# Patient Record
Sex: Male | Born: 1945
Health system: Southern US, Community
[De-identification: ages and names within clinical notes are randomized; demographics above are authoritative.]

## PROBLEM LIST (undated history)

## (undated) DIAGNOSIS — I739 Peripheral vascular disease, unspecified: Secondary | ICD-10-CM

## (undated) DIAGNOSIS — R011 Cardiac murmur, unspecified: Secondary | ICD-10-CM

## (undated) DIAGNOSIS — I10 Essential (primary) hypertension: Secondary | ICD-10-CM

## (undated) DIAGNOSIS — I700 Atherosclerosis of aorta: Secondary | ICD-10-CM

## (undated) DIAGNOSIS — D539 Nutritional anemia, unspecified: Secondary | ICD-10-CM

## (undated) DIAGNOSIS — R002 Palpitations: Secondary | ICD-10-CM

## (undated) DIAGNOSIS — E785 Hyperlipidemia, unspecified: Secondary | ICD-10-CM

## (undated) DIAGNOSIS — R9439 Abnormal result of other cardiovascular function study: Secondary | ICD-10-CM

## (undated) DIAGNOSIS — M1990 Unspecified osteoarthritis, unspecified site: Secondary | ICD-10-CM

## (undated) DIAGNOSIS — I779 Disorder of arteries and arterioles, unspecified: Secondary | ICD-10-CM

## (undated) DIAGNOSIS — I7300 Raynaud's syndrome without gangrene: Secondary | ICD-10-CM

## (undated) HISTORY — DX: Unspecified osteoarthritis, unspecified site: M19.90

## (undated) HISTORY — DX: Disorder of arteries and arterioles, unspecified: I77.9

## (undated) HISTORY — PX: BILATERAL KNEE ARTHROSCOPY: SUR91

## (undated) HISTORY — DX: Hyperlipidemia, unspecified: E78.5

## (undated) HISTORY — PX: APPENDECTOMY: SHX54

## (undated) HISTORY — PX: RETINAL DETACHMENT SURGERY: SHX105

## (undated) HISTORY — DX: Peripheral vascular disease, unspecified: I73.9

## (undated) HISTORY — DX: Atherosclerosis of aorta: I70.0

## (undated) HISTORY — PX: SHOULDER SURGERY: SHX246

## (undated) HISTORY — DX: Cardiac murmur, unspecified: R01.1

## (undated) HISTORY — DX: Raynaud's syndrome without gangrene: I73.00

## (undated) HISTORY — DX: Palpitations: R00.2

## (undated) HISTORY — PX: NECK SURGERY: SHX720

## (undated) HISTORY — PX: TENDON REPAIR: SHX5111

## (undated) HISTORY — PX: HERNIA REPAIR: SHX51

## (undated) HISTORY — DX: Abnormal result of other cardiovascular function study: R94.39

## (undated) HISTORY — PX: BACK SURGERY: SHX140

## (undated) HISTORY — PX: NOSE SURGERY: SHX723

---

## 2001-03-16 ENCOUNTER — Observation Stay (HOSPITAL_COMMUNITY): Admission: RE | Admit: 2001-03-16 | Discharge: 2001-03-17 | Payer: Self-pay | Admitting: Neurological Surgery

## 2001-03-16 ENCOUNTER — Encounter: Payer: Self-pay | Admitting: Neurological Surgery

## 2001-04-06 ENCOUNTER — Encounter: Payer: Self-pay | Admitting: Neurological Surgery

## 2001-04-06 ENCOUNTER — Encounter: Admission: RE | Admit: 2001-04-06 | Discharge: 2001-04-06 | Payer: Self-pay | Admitting: Neurological Surgery

## 2007-12-01 ENCOUNTER — Ambulatory Visit: Payer: Self-pay | Admitting: General Practice

## 2009-02-21 ENCOUNTER — Ambulatory Visit: Payer: Self-pay | Admitting: General Practice

## 2009-03-28 ENCOUNTER — Ambulatory Visit: Payer: Self-pay | Admitting: General Practice

## 2009-04-17 ENCOUNTER — Ambulatory Visit: Payer: Self-pay | Admitting: General Practice

## 2010-04-18 ENCOUNTER — Ambulatory Visit: Payer: Self-pay | Admitting: General Practice

## 2011-02-25 ENCOUNTER — Ambulatory Visit: Payer: Self-pay | Admitting: General Practice

## 2011-11-05 ENCOUNTER — Ambulatory Visit: Payer: Self-pay | Admitting: General Practice

## 2012-02-10 ENCOUNTER — Ambulatory Visit: Payer: Self-pay | Admitting: General Practice

## 2012-07-16 DIAGNOSIS — Z23 Encounter for immunization: Secondary | ICD-10-CM | POA: Diagnosis not present

## 2012-08-03 DIAGNOSIS — I10 Essential (primary) hypertension: Secondary | ICD-10-CM | POA: Diagnosis not present

## 2012-08-03 DIAGNOSIS — IMO0002 Reserved for concepts with insufficient information to code with codable children: Secondary | ICD-10-CM | POA: Diagnosis not present

## 2012-08-03 DIAGNOSIS — I499 Cardiac arrhythmia, unspecified: Secondary | ICD-10-CM | POA: Diagnosis not present

## 2012-08-03 DIAGNOSIS — M129 Arthropathy, unspecified: Secondary | ICD-10-CM | POA: Diagnosis not present

## 2012-08-12 DIAGNOSIS — N486 Induration penis plastica: Secondary | ICD-10-CM | POA: Diagnosis not present

## 2012-08-12 DIAGNOSIS — E291 Testicular hypofunction: Secondary | ICD-10-CM | POA: Diagnosis not present

## 2012-08-26 DIAGNOSIS — M129 Arthropathy, unspecified: Secondary | ICD-10-CM | POA: Diagnosis not present

## 2012-08-26 DIAGNOSIS — F17200 Nicotine dependence, unspecified, uncomplicated: Secondary | ICD-10-CM | POA: Diagnosis not present

## 2012-08-26 DIAGNOSIS — N400 Enlarged prostate without lower urinary tract symptoms: Secondary | ICD-10-CM | POA: Diagnosis not present

## 2012-08-26 DIAGNOSIS — I10 Essential (primary) hypertension: Secondary | ICD-10-CM | POA: Diagnosis not present

## 2012-10-28 DIAGNOSIS — N486 Induration penis plastica: Secondary | ICD-10-CM | POA: Diagnosis not present

## 2012-11-15 DIAGNOSIS — I10 Essential (primary) hypertension: Secondary | ICD-10-CM | POA: Diagnosis not present

## 2012-11-15 DIAGNOSIS — K2100 Gastro-esophageal reflux disease with esophagitis, without bleeding: Secondary | ICD-10-CM | POA: Diagnosis not present

## 2012-11-15 DIAGNOSIS — N400 Enlarged prostate without lower urinary tract symptoms: Secondary | ICD-10-CM | POA: Diagnosis not present

## 2012-11-15 DIAGNOSIS — M549 Dorsalgia, unspecified: Secondary | ICD-10-CM | POA: Diagnosis not present

## 2012-11-15 DIAGNOSIS — Z23 Encounter for immunization: Secondary | ICD-10-CM | POA: Diagnosis not present

## 2012-11-26 DIAGNOSIS — Z23 Encounter for immunization: Secondary | ICD-10-CM | POA: Diagnosis not present

## 2012-11-26 DIAGNOSIS — N400 Enlarged prostate without lower urinary tract symptoms: Secondary | ICD-10-CM | POA: Diagnosis not present

## 2012-11-26 DIAGNOSIS — J019 Acute sinusitis, unspecified: Secondary | ICD-10-CM | POA: Diagnosis not present

## 2012-11-26 DIAGNOSIS — K2100 Gastro-esophageal reflux disease with esophagitis, without bleeding: Secondary | ICD-10-CM | POA: Diagnosis not present

## 2012-12-16 DIAGNOSIS — H33309 Unspecified retinal break, unspecified eye: Secondary | ICD-10-CM | POA: Diagnosis not present

## 2012-12-16 DIAGNOSIS — H5200 Hypermetropia, unspecified eye: Secondary | ICD-10-CM | POA: Diagnosis not present

## 2012-12-17 DIAGNOSIS — R5381 Other malaise: Secondary | ICD-10-CM | POA: Diagnosis not present

## 2012-12-17 DIAGNOSIS — K2100 Gastro-esophageal reflux disease with esophagitis, without bleeding: Secondary | ICD-10-CM | POA: Diagnosis not present

## 2012-12-17 DIAGNOSIS — M5380 Other specified dorsopathies, site unspecified: Secondary | ICD-10-CM | POA: Diagnosis not present

## 2012-12-17 DIAGNOSIS — R109 Unspecified abdominal pain: Secondary | ICD-10-CM | POA: Diagnosis not present

## 2012-12-23 DIAGNOSIS — R198 Other specified symptoms and signs involving the digestive system and abdomen: Secondary | ICD-10-CM | POA: Diagnosis not present

## 2012-12-23 DIAGNOSIS — R109 Unspecified abdominal pain: Secondary | ICD-10-CM | POA: Diagnosis not present

## 2012-12-23 DIAGNOSIS — R110 Nausea: Secondary | ICD-10-CM | POA: Diagnosis not present

## 2012-12-24 DIAGNOSIS — K5190 Ulcerative colitis, unspecified, without complications: Secondary | ICD-10-CM | POA: Diagnosis not present

## 2012-12-24 DIAGNOSIS — K3189 Other diseases of stomach and duodenum: Secondary | ICD-10-CM | POA: Diagnosis not present

## 2012-12-24 DIAGNOSIS — R1013 Epigastric pain: Secondary | ICD-10-CM | POA: Diagnosis not present

## 2012-12-24 DIAGNOSIS — K319 Disease of stomach and duodenum, unspecified: Secondary | ICD-10-CM | POA: Diagnosis not present

## 2012-12-24 DIAGNOSIS — K2289 Other specified disease of esophagus: Secondary | ICD-10-CM | POA: Diagnosis not present

## 2012-12-24 DIAGNOSIS — R109 Unspecified abdominal pain: Secondary | ICD-10-CM | POA: Diagnosis not present

## 2012-12-24 DIAGNOSIS — K6389 Other specified diseases of intestine: Secondary | ICD-10-CM | POA: Diagnosis not present

## 2012-12-24 DIAGNOSIS — R198 Other specified symptoms and signs involving the digestive system and abdomen: Secondary | ICD-10-CM | POA: Diagnosis not present

## 2012-12-24 DIAGNOSIS — K633 Ulcer of intestine: Secondary | ICD-10-CM | POA: Diagnosis not present

## 2012-12-24 DIAGNOSIS — K2940 Chronic atrophic gastritis without bleeding: Secondary | ICD-10-CM | POA: Diagnosis not present

## 2012-12-24 DIAGNOSIS — K5289 Other specified noninfective gastroenteritis and colitis: Secondary | ICD-10-CM | POA: Diagnosis not present

## 2012-12-24 DIAGNOSIS — K209 Esophagitis, unspecified without bleeding: Secondary | ICD-10-CM | POA: Diagnosis not present

## 2012-12-24 LAB — HM COLONOSCOPY

## 2012-12-29 ENCOUNTER — Ambulatory Visit: Payer: Self-pay | Admitting: Gastroenterology

## 2012-12-29 DIAGNOSIS — R109 Unspecified abdominal pain: Secondary | ICD-10-CM | POA: Diagnosis not present

## 2012-12-29 DIAGNOSIS — R110 Nausea: Secondary | ICD-10-CM | POA: Diagnosis not present

## 2013-01-14 DIAGNOSIS — M47817 Spondylosis without myelopathy or radiculopathy, lumbosacral region: Secondary | ICD-10-CM | POA: Diagnosis not present

## 2013-01-14 DIAGNOSIS — IMO0002 Reserved for concepts with insufficient information to code with codable children: Secondary | ICD-10-CM | POA: Diagnosis not present

## 2013-02-01 DIAGNOSIS — M75120 Complete rotator cuff tear or rupture of unspecified shoulder, not specified as traumatic: Secondary | ICD-10-CM | POA: Diagnosis not present

## 2013-02-01 DIAGNOSIS — M19019 Primary osteoarthritis, unspecified shoulder: Secondary | ICD-10-CM | POA: Diagnosis not present

## 2013-02-23 DIAGNOSIS — I10 Essential (primary) hypertension: Secondary | ICD-10-CM | POA: Diagnosis not present

## 2013-02-23 DIAGNOSIS — R109 Unspecified abdominal pain: Secondary | ICD-10-CM | POA: Diagnosis not present

## 2013-02-23 DIAGNOSIS — Z1339 Encounter for screening examination for other mental health and behavioral disorders: Secondary | ICD-10-CM | POA: Diagnosis not present

## 2013-02-23 DIAGNOSIS — R5381 Other malaise: Secondary | ICD-10-CM | POA: Diagnosis not present

## 2013-02-23 DIAGNOSIS — Z0000 Encounter for general adult medical examination without abnormal findings: Secondary | ICD-10-CM | POA: Diagnosis not present

## 2013-02-23 DIAGNOSIS — Z125 Encounter for screening for malignant neoplasm of prostate: Secondary | ICD-10-CM | POA: Diagnosis not present

## 2013-02-23 DIAGNOSIS — Z1331 Encounter for screening for depression: Secondary | ICD-10-CM | POA: Diagnosis not present

## 2013-02-23 DIAGNOSIS — M5380 Other specified dorsopathies, site unspecified: Secondary | ICD-10-CM | POA: Diagnosis not present

## 2013-03-03 DIAGNOSIS — M25519 Pain in unspecified shoulder: Secondary | ICD-10-CM | POA: Diagnosis not present

## 2013-03-03 DIAGNOSIS — M19029 Primary osteoarthritis, unspecified elbow: Secondary | ICD-10-CM | POA: Diagnosis not present

## 2013-03-03 DIAGNOSIS — M19019 Primary osteoarthritis, unspecified shoulder: Secondary | ICD-10-CM | POA: Diagnosis not present

## 2013-03-03 DIAGNOSIS — M719 Bursopathy, unspecified: Secondary | ICD-10-CM | POA: Diagnosis not present

## 2013-03-03 DIAGNOSIS — M67919 Unspecified disorder of synovium and tendon, unspecified shoulder: Secondary | ICD-10-CM | POA: Diagnosis not present

## 2013-03-03 DIAGNOSIS — M75120 Complete rotator cuff tear or rupture of unspecified shoulder, not specified as traumatic: Secondary | ICD-10-CM | POA: Diagnosis not present

## 2013-03-03 DIAGNOSIS — Z9889 Other specified postprocedural states: Secondary | ICD-10-CM | POA: Diagnosis not present

## 2013-03-08 DIAGNOSIS — R1084 Generalized abdominal pain: Secondary | ICD-10-CM | POA: Diagnosis not present

## 2013-03-08 DIAGNOSIS — I8310 Varicose veins of unspecified lower extremity with inflammation: Secondary | ICD-10-CM | POA: Diagnosis not present

## 2013-03-08 DIAGNOSIS — R109 Unspecified abdominal pain: Secondary | ICD-10-CM | POA: Diagnosis not present

## 2013-03-08 DIAGNOSIS — M7989 Other specified soft tissue disorders: Secondary | ICD-10-CM | POA: Diagnosis not present

## 2013-03-09 DIAGNOSIS — M75120 Complete rotator cuff tear or rupture of unspecified shoulder, not specified as traumatic: Secondary | ICD-10-CM | POA: Diagnosis not present

## 2013-03-20 DIAGNOSIS — M75120 Complete rotator cuff tear or rupture of unspecified shoulder, not specified as traumatic: Secondary | ICD-10-CM | POA: Diagnosis not present

## 2013-04-05 DIAGNOSIS — M19019 Primary osteoarthritis, unspecified shoulder: Secondary | ICD-10-CM | POA: Diagnosis not present

## 2013-04-05 DIAGNOSIS — M75120 Complete rotator cuff tear or rupture of unspecified shoulder, not specified as traumatic: Secondary | ICD-10-CM | POA: Diagnosis not present

## 2013-04-14 DIAGNOSIS — R109 Unspecified abdominal pain: Secondary | ICD-10-CM | POA: Diagnosis not present

## 2013-05-09 DIAGNOSIS — M75110 Incomplete rotator cuff tear or rupture of unspecified shoulder, not specified as traumatic: Secondary | ICD-10-CM | POA: Diagnosis not present

## 2013-05-09 DIAGNOSIS — R002 Palpitations: Secondary | ICD-10-CM | POA: Diagnosis not present

## 2013-05-09 DIAGNOSIS — M549 Dorsalgia, unspecified: Secondary | ICD-10-CM | POA: Diagnosis not present

## 2013-05-09 DIAGNOSIS — I10 Essential (primary) hypertension: Secondary | ICD-10-CM | POA: Diagnosis not present

## 2013-05-12 DIAGNOSIS — M75122 Complete rotator cuff tear or rupture of left shoulder, not specified as traumatic: Secondary | ICD-10-CM | POA: Insufficient documentation

## 2013-05-13 DIAGNOSIS — M719 Bursopathy, unspecified: Secondary | ICD-10-CM | POA: Diagnosis not present

## 2013-05-13 DIAGNOSIS — M7500 Adhesive capsulitis of unspecified shoulder: Secondary | ICD-10-CM | POA: Diagnosis not present

## 2013-05-13 DIAGNOSIS — M67919 Unspecified disorder of synovium and tendon, unspecified shoulder: Secondary | ICD-10-CM | POA: Diagnosis not present

## 2013-05-13 DIAGNOSIS — M75120 Complete rotator cuff tear or rupture of unspecified shoulder, not specified as traumatic: Secondary | ICD-10-CM | POA: Diagnosis not present

## 2013-05-13 DIAGNOSIS — M75110 Incomplete rotator cuff tear or rupture of unspecified shoulder, not specified as traumatic: Secondary | ICD-10-CM | POA: Diagnosis not present

## 2013-05-13 DIAGNOSIS — Z5333 Arthroscopic surgical procedure converted to open procedure: Secondary | ICD-10-CM | POA: Diagnosis not present

## 2013-05-17 DIAGNOSIS — M67919 Unspecified disorder of synovium and tendon, unspecified shoulder: Secondary | ICD-10-CM | POA: Diagnosis not present

## 2013-05-20 DIAGNOSIS — M67919 Unspecified disorder of synovium and tendon, unspecified shoulder: Secondary | ICD-10-CM | POA: Diagnosis not present

## 2013-06-20 DIAGNOSIS — M67919 Unspecified disorder of synovium and tendon, unspecified shoulder: Secondary | ICD-10-CM | POA: Diagnosis not present

## 2013-06-21 DIAGNOSIS — M67919 Unspecified disorder of synovium and tendon, unspecified shoulder: Secondary | ICD-10-CM | POA: Diagnosis not present

## 2013-06-22 DIAGNOSIS — N486 Induration penis plastica: Secondary | ICD-10-CM | POA: Diagnosis not present

## 2013-06-23 DIAGNOSIS — N486 Induration penis plastica: Secondary | ICD-10-CM | POA: Diagnosis not present

## 2013-06-24 DIAGNOSIS — M67919 Unspecified disorder of synovium and tendon, unspecified shoulder: Secondary | ICD-10-CM | POA: Diagnosis not present

## 2013-06-27 DIAGNOSIS — M67919 Unspecified disorder of synovium and tendon, unspecified shoulder: Secondary | ICD-10-CM | POA: Diagnosis not present

## 2013-06-29 DIAGNOSIS — M67919 Unspecified disorder of synovium and tendon, unspecified shoulder: Secondary | ICD-10-CM | POA: Diagnosis not present

## 2013-07-05 DIAGNOSIS — M67919 Unspecified disorder of synovium and tendon, unspecified shoulder: Secondary | ICD-10-CM | POA: Diagnosis not present

## 2013-07-06 DIAGNOSIS — M67919 Unspecified disorder of synovium and tendon, unspecified shoulder: Secondary | ICD-10-CM | POA: Diagnosis not present

## 2013-07-08 DIAGNOSIS — M67919 Unspecified disorder of synovium and tendon, unspecified shoulder: Secondary | ICD-10-CM | POA: Diagnosis not present

## 2013-07-14 DIAGNOSIS — M546 Pain in thoracic spine: Secondary | ICD-10-CM | POA: Diagnosis not present

## 2013-07-14 DIAGNOSIS — M5412 Radiculopathy, cervical region: Secondary | ICD-10-CM | POA: Diagnosis not present

## 2013-07-15 DIAGNOSIS — M67919 Unspecified disorder of synovium and tendon, unspecified shoulder: Secondary | ICD-10-CM | POA: Diagnosis not present

## 2013-07-18 DIAGNOSIS — M67919 Unspecified disorder of synovium and tendon, unspecified shoulder: Secondary | ICD-10-CM | POA: Diagnosis not present

## 2013-07-20 DIAGNOSIS — M67919 Unspecified disorder of synovium and tendon, unspecified shoulder: Secondary | ICD-10-CM | POA: Diagnosis not present

## 2013-07-26 ENCOUNTER — Ambulatory Visit: Payer: Self-pay | Admitting: Neurological Surgery

## 2013-07-26 DIAGNOSIS — M5020 Other cervical disc displacement, unspecified cervical region: Secondary | ICD-10-CM | POA: Diagnosis not present

## 2013-07-26 DIAGNOSIS — M5412 Radiculopathy, cervical region: Secondary | ICD-10-CM | POA: Diagnosis not present

## 2013-08-03 DIAGNOSIS — M5412 Radiculopathy, cervical region: Secondary | ICD-10-CM | POA: Diagnosis not present

## 2013-08-04 ENCOUNTER — Other Ambulatory Visit: Payer: Self-pay | Admitting: Neurological Surgery

## 2013-08-04 DIAGNOSIS — M7500 Adhesive capsulitis of unspecified shoulder: Secondary | ICD-10-CM | POA: Diagnosis not present

## 2013-08-04 DIAGNOSIS — M67919 Unspecified disorder of synovium and tendon, unspecified shoulder: Secondary | ICD-10-CM | POA: Diagnosis not present

## 2013-08-17 DIAGNOSIS — Z79899 Other long term (current) drug therapy: Secondary | ICD-10-CM | POA: Diagnosis not present

## 2013-08-17 DIAGNOSIS — N486 Induration penis plastica: Secondary | ICD-10-CM | POA: Diagnosis not present

## 2013-08-18 DIAGNOSIS — N486 Induration penis plastica: Secondary | ICD-10-CM | POA: Diagnosis not present

## 2013-08-22 DIAGNOSIS — Z1331 Encounter for screening for depression: Secondary | ICD-10-CM | POA: Diagnosis not present

## 2013-08-22 DIAGNOSIS — M25519 Pain in unspecified shoulder: Secondary | ICD-10-CM | POA: Diagnosis not present

## 2013-08-22 DIAGNOSIS — Z23 Encounter for immunization: Secondary | ICD-10-CM | POA: Diagnosis not present

## 2013-08-22 DIAGNOSIS — M549 Dorsalgia, unspecified: Secondary | ICD-10-CM | POA: Diagnosis not present

## 2013-08-22 DIAGNOSIS — Z1330 Encounter for screening examination for mental health and behavioral disorders, unspecified: Secondary | ICD-10-CM | POA: Diagnosis not present

## 2013-08-22 DIAGNOSIS — R636 Underweight: Secondary | ICD-10-CM | POA: Diagnosis not present

## 2013-08-23 ENCOUNTER — Encounter (HOSPITAL_COMMUNITY): Payer: Self-pay | Admitting: Pharmacy Technician

## 2013-08-25 ENCOUNTER — Inpatient Hospital Stay (HOSPITAL_COMMUNITY): Admission: RE | Admit: 2013-08-25 | Payer: Self-pay | Source: Ambulatory Visit

## 2013-08-25 DIAGNOSIS — M7500 Adhesive capsulitis of unspecified shoulder: Secondary | ICD-10-CM | POA: Diagnosis not present

## 2013-08-25 DIAGNOSIS — M67919 Unspecified disorder of synovium and tendon, unspecified shoulder: Secondary | ICD-10-CM | POA: Diagnosis not present

## 2013-09-06 ENCOUNTER — Encounter (HOSPITAL_COMMUNITY): Admission: RE | Payer: Self-pay | Source: Ambulatory Visit

## 2013-09-06 ENCOUNTER — Inpatient Hospital Stay (HOSPITAL_COMMUNITY): Admission: RE | Admit: 2013-09-06 | Payer: Medicare Other | Source: Ambulatory Visit | Admitting: Neurological Surgery

## 2013-09-06 SURGERY — ANTERIOR CERVICAL DECOMPRESSION/DISCECTOMY FUSION 1 LEVEL/HARDWARE REMOVAL
Anesthesia: General

## 2013-09-29 DIAGNOSIS — M67919 Unspecified disorder of synovium and tendon, unspecified shoulder: Secondary | ICD-10-CM | POA: Diagnosis not present

## 2013-09-29 DIAGNOSIS — M7500 Adhesive capsulitis of unspecified shoulder: Secondary | ICD-10-CM | POA: Diagnosis not present

## 2013-10-10 ENCOUNTER — Ambulatory Visit: Payer: Self-pay | Admitting: Orthopedic Surgery

## 2013-10-10 DIAGNOSIS — M7530 Calcific tendinitis of unspecified shoulder: Secondary | ICD-10-CM | POA: Diagnosis not present

## 2013-10-10 DIAGNOSIS — S46819A Strain of other muscles, fascia and tendons at shoulder and upper arm level, unspecified arm, initial encounter: Secondary | ICD-10-CM | POA: Diagnosis not present

## 2013-10-27 DIAGNOSIS — M7500 Adhesive capsulitis of unspecified shoulder: Secondary | ICD-10-CM | POA: Diagnosis not present

## 2013-10-27 DIAGNOSIS — M25519 Pain in unspecified shoulder: Secondary | ICD-10-CM | POA: Diagnosis not present

## 2013-10-27 DIAGNOSIS — M67919 Unspecified disorder of synovium and tendon, unspecified shoulder: Secondary | ICD-10-CM | POA: Diagnosis not present

## 2013-10-27 DIAGNOSIS — M719 Bursopathy, unspecified: Secondary | ICD-10-CM | POA: Diagnosis not present

## 2013-11-02 DIAGNOSIS — N486 Induration penis plastica: Secondary | ICD-10-CM | POA: Diagnosis not present

## 2013-11-02 DIAGNOSIS — R351 Nocturia: Secondary | ICD-10-CM | POA: Diagnosis not present

## 2013-11-02 DIAGNOSIS — R350 Frequency of micturition: Secondary | ICD-10-CM | POA: Diagnosis not present

## 2013-11-03 DIAGNOSIS — N486 Induration penis plastica: Secondary | ICD-10-CM | POA: Diagnosis not present

## 2013-11-03 DIAGNOSIS — Z79899 Other long term (current) drug therapy: Secondary | ICD-10-CM | POA: Diagnosis not present

## 2013-11-03 DIAGNOSIS — N400 Enlarged prostate without lower urinary tract symptoms: Secondary | ICD-10-CM | POA: Diagnosis not present

## 2013-11-21 DIAGNOSIS — Z23 Encounter for immunization: Secondary | ICD-10-CM | POA: Diagnosis not present

## 2013-11-21 DIAGNOSIS — M25519 Pain in unspecified shoulder: Secondary | ICD-10-CM | POA: Diagnosis not present

## 2013-11-21 DIAGNOSIS — R636 Underweight: Secondary | ICD-10-CM | POA: Diagnosis not present

## 2013-11-21 DIAGNOSIS — Z1330 Encounter for screening examination for mental health and behavioral disorders, unspecified: Secondary | ICD-10-CM | POA: Diagnosis not present

## 2013-11-21 DIAGNOSIS — Z1342 Encounter for screening for global developmental delays (milestones): Secondary | ICD-10-CM | POA: Diagnosis not present

## 2013-11-21 DIAGNOSIS — M549 Dorsalgia, unspecified: Secondary | ICD-10-CM | POA: Diagnosis not present

## 2013-11-21 DIAGNOSIS — Z1331 Encounter for screening for depression: Secondary | ICD-10-CM | POA: Diagnosis not present

## 2013-11-25 DIAGNOSIS — M899 Disorder of bone, unspecified: Secondary | ICD-10-CM | POA: Diagnosis not present

## 2013-11-25 DIAGNOSIS — I10 Essential (primary) hypertension: Secondary | ICD-10-CM | POA: Diagnosis not present

## 2013-11-25 DIAGNOSIS — M949 Disorder of cartilage, unspecified: Secondary | ICD-10-CM | POA: Diagnosis not present

## 2013-11-25 DIAGNOSIS — M4310 Spondylolisthesis, site unspecified: Secondary | ICD-10-CM | POA: Diagnosis not present

## 2013-11-25 DIAGNOSIS — M25519 Pain in unspecified shoulder: Secondary | ICD-10-CM | POA: Diagnosis not present

## 2013-11-25 DIAGNOSIS — Z981 Arthrodesis status: Secondary | ICD-10-CM | POA: Diagnosis not present

## 2013-11-25 DIAGNOSIS — M19019 Primary osteoarthritis, unspecified shoulder: Secondary | ICD-10-CM | POA: Diagnosis not present

## 2013-12-16 DIAGNOSIS — H2510 Age-related nuclear cataract, unspecified eye: Secondary | ICD-10-CM | POA: Diagnosis not present

## 2013-12-21 DIAGNOSIS — N486 Induration penis plastica: Secondary | ICD-10-CM | POA: Diagnosis not present

## 2013-12-21 DIAGNOSIS — R351 Nocturia: Secondary | ICD-10-CM | POA: Diagnosis not present

## 2013-12-21 DIAGNOSIS — R350 Frequency of micturition: Secondary | ICD-10-CM | POA: Diagnosis not present

## 2013-12-22 DIAGNOSIS — R3989 Other symptoms and signs involving the genitourinary system: Secondary | ICD-10-CM | POA: Diagnosis not present

## 2013-12-22 DIAGNOSIS — N486 Induration penis plastica: Secondary | ICD-10-CM | POA: Diagnosis not present

## 2013-12-22 DIAGNOSIS — N400 Enlarged prostate without lower urinary tract symptoms: Secondary | ICD-10-CM | POA: Diagnosis not present

## 2014-02-13 DIAGNOSIS — Z1342 Encounter for screening for global developmental delays (milestones): Secondary | ICD-10-CM | POA: Diagnosis not present

## 2014-02-13 DIAGNOSIS — Z1330 Encounter for screening examination for mental health and behavioral disorders, unspecified: Secondary | ICD-10-CM | POA: Diagnosis not present

## 2014-02-13 DIAGNOSIS — Z1331 Encounter for screening for depression: Secondary | ICD-10-CM | POA: Diagnosis not present

## 2014-02-13 DIAGNOSIS — IMO0002 Reserved for concepts with insufficient information to code with codable children: Secondary | ICD-10-CM | POA: Diagnosis not present

## 2014-02-13 DIAGNOSIS — Z23 Encounter for immunization: Secondary | ICD-10-CM | POA: Diagnosis not present

## 2014-02-13 DIAGNOSIS — M129 Arthropathy, unspecified: Secondary | ICD-10-CM | POA: Diagnosis not present

## 2014-02-13 DIAGNOSIS — M549 Dorsalgia, unspecified: Secondary | ICD-10-CM | POA: Diagnosis not present

## 2014-02-15 DIAGNOSIS — R351 Nocturia: Secondary | ICD-10-CM | POA: Diagnosis not present

## 2014-02-15 DIAGNOSIS — R350 Frequency of micturition: Secondary | ICD-10-CM | POA: Diagnosis not present

## 2014-02-15 DIAGNOSIS — N486 Induration penis plastica: Secondary | ICD-10-CM | POA: Diagnosis not present

## 2014-02-15 DIAGNOSIS — Z125 Encounter for screening for malignant neoplasm of prostate: Secondary | ICD-10-CM | POA: Diagnosis not present

## 2014-02-15 DIAGNOSIS — N400 Enlarged prostate without lower urinary tract symptoms: Secondary | ICD-10-CM | POA: Diagnosis not present

## 2014-02-23 DIAGNOSIS — I10 Essential (primary) hypertension: Secondary | ICD-10-CM | POA: Diagnosis not present

## 2014-02-23 DIAGNOSIS — Z8349 Family history of other endocrine, nutritional and metabolic diseases: Secondary | ICD-10-CM | POA: Diagnosis not present

## 2014-03-17 DIAGNOSIS — M47817 Spondylosis without myelopathy or radiculopathy, lumbosacral region: Secondary | ICD-10-CM | POA: Diagnosis not present

## 2014-03-17 DIAGNOSIS — IMO0002 Reserved for concepts with insufficient information to code with codable children: Secondary | ICD-10-CM | POA: Diagnosis not present

## 2014-03-20 DIAGNOSIS — T1500XA Foreign body in cornea, unspecified eye, initial encounter: Secondary | ICD-10-CM | POA: Diagnosis not present

## 2014-05-16 DIAGNOSIS — Z125 Encounter for screening for malignant neoplasm of prostate: Secondary | ICD-10-CM | POA: Diagnosis not present

## 2014-05-16 DIAGNOSIS — Z1331 Encounter for screening for depression: Secondary | ICD-10-CM | POA: Diagnosis not present

## 2014-05-16 DIAGNOSIS — Z0000 Encounter for general adult medical examination without abnormal findings: Secondary | ICD-10-CM | POA: Diagnosis not present

## 2014-05-16 DIAGNOSIS — Z23 Encounter for immunization: Secondary | ICD-10-CM | POA: Diagnosis not present

## 2014-05-16 DIAGNOSIS — Z1330 Encounter for screening examination for mental health and behavioral disorders, unspecified: Secondary | ICD-10-CM | POA: Diagnosis not present

## 2014-05-16 DIAGNOSIS — M549 Dorsalgia, unspecified: Secondary | ICD-10-CM | POA: Diagnosis not present

## 2014-05-16 LAB — PSA: PSA: 0.9

## 2014-07-03 DIAGNOSIS — M722 Plantar fascial fibromatosis: Secondary | ICD-10-CM | POA: Diagnosis not present

## 2014-07-24 DIAGNOSIS — M722 Plantar fascial fibromatosis: Secondary | ICD-10-CM | POA: Diagnosis not present

## 2014-08-08 DIAGNOSIS — M549 Dorsalgia, unspecified: Secondary | ICD-10-CM | POA: Diagnosis not present

## 2014-08-08 DIAGNOSIS — Z23 Encounter for immunization: Secondary | ICD-10-CM | POA: Diagnosis not present

## 2014-08-08 DIAGNOSIS — I10 Essential (primary) hypertension: Secondary | ICD-10-CM | POA: Diagnosis not present

## 2014-08-08 DIAGNOSIS — M25511 Pain in right shoulder: Secondary | ICD-10-CM | POA: Diagnosis not present

## 2014-08-18 ENCOUNTER — Ambulatory Visit: Payer: Self-pay | Admitting: Family Medicine

## 2014-08-18 DIAGNOSIS — R0989 Other specified symptoms and signs involving the circulatory and respiratory systems: Secondary | ICD-10-CM | POA: Diagnosis not present

## 2014-08-18 DIAGNOSIS — I6523 Occlusion and stenosis of bilateral carotid arteries: Secondary | ICD-10-CM | POA: Diagnosis not present

## 2014-10-02 DIAGNOSIS — Z87891 Personal history of nicotine dependence: Secondary | ICD-10-CM | POA: Diagnosis not present

## 2014-10-02 DIAGNOSIS — S86011A Strain of right Achilles tendon, initial encounter: Secondary | ICD-10-CM | POA: Diagnosis not present

## 2014-10-02 DIAGNOSIS — S86091A Other specified injury of right Achilles tendon, initial encounter: Secondary | ICD-10-CM | POA: Diagnosis not present

## 2014-10-02 DIAGNOSIS — Z681 Body mass index (BMI) 19 or less, adult: Secondary | ICD-10-CM | POA: Diagnosis not present

## 2014-10-02 DIAGNOSIS — M79604 Pain in right leg: Secondary | ICD-10-CM | POA: Diagnosis not present

## 2014-10-04 DIAGNOSIS — S86811A Strain of other muscle(s) and tendon(s) at lower leg level, right leg, initial encounter: Secondary | ICD-10-CM | POA: Diagnosis not present

## 2014-10-20 DIAGNOSIS — S86809S Unspecified injury of other muscle(s) and tendon(s) at lower leg level, unspecified leg, sequela: Secondary | ICD-10-CM | POA: Diagnosis not present

## 2014-10-20 DIAGNOSIS — M25561 Pain in right knee: Secondary | ICD-10-CM | POA: Diagnosis not present

## 2014-11-06 ENCOUNTER — Ambulatory Visit: Payer: Self-pay | Admitting: Family Medicine

## 2014-11-06 DIAGNOSIS — Z0389 Encounter for observation for other suspected diseases and conditions ruled out: Secondary | ICD-10-CM | POA: Diagnosis not present

## 2014-11-06 DIAGNOSIS — Z23 Encounter for immunization: Secondary | ICD-10-CM | POA: Diagnosis not present

## 2014-11-06 DIAGNOSIS — Z8709 Personal history of other diseases of the respiratory system: Secondary | ICD-10-CM | POA: Diagnosis not present

## 2014-11-06 DIAGNOSIS — N419 Inflammatory disease of prostate, unspecified: Secondary | ICD-10-CM | POA: Diagnosis not present

## 2014-11-06 DIAGNOSIS — M549 Dorsalgia, unspecified: Secondary | ICD-10-CM | POA: Diagnosis not present

## 2014-11-06 DIAGNOSIS — R918 Other nonspecific abnormal finding of lung field: Secondary | ICD-10-CM | POA: Diagnosis not present

## 2014-11-08 DIAGNOSIS — S86809S Unspecified injury of other muscle(s) and tendon(s) at lower leg level, unspecified leg, sequela: Secondary | ICD-10-CM | POA: Diagnosis not present

## 2014-11-08 DIAGNOSIS — M25561 Pain in right knee: Secondary | ICD-10-CM | POA: Diagnosis not present

## 2014-11-13 DIAGNOSIS — S86809S Unspecified injury of other muscle(s) and tendon(s) at lower leg level, unspecified leg, sequela: Secondary | ICD-10-CM | POA: Diagnosis not present

## 2014-11-13 DIAGNOSIS — M25561 Pain in right knee: Secondary | ICD-10-CM | POA: Diagnosis not present

## 2014-11-14 ENCOUNTER — Ambulatory Visit: Payer: Self-pay | Admitting: Family Medicine

## 2014-11-14 DIAGNOSIS — R918 Other nonspecific abnormal finding of lung field: Secondary | ICD-10-CM | POA: Diagnosis not present

## 2014-11-17 DIAGNOSIS — S86809S Unspecified injury of other muscle(s) and tendon(s) at lower leg level, unspecified leg, sequela: Secondary | ICD-10-CM | POA: Diagnosis not present

## 2014-11-17 DIAGNOSIS — M25561 Pain in right knee: Secondary | ICD-10-CM | POA: Diagnosis not present

## 2014-11-20 DIAGNOSIS — S86809S Unspecified injury of other muscle(s) and tendon(s) at lower leg level, unspecified leg, sequela: Secondary | ICD-10-CM | POA: Diagnosis not present

## 2014-11-20 DIAGNOSIS — M722 Plantar fascial fibromatosis: Secondary | ICD-10-CM | POA: Diagnosis not present

## 2014-11-20 DIAGNOSIS — M25561 Pain in right knee: Secondary | ICD-10-CM | POA: Diagnosis not present

## 2014-11-21 DIAGNOSIS — M722 Plantar fascial fibromatosis: Secondary | ICD-10-CM | POA: Diagnosis not present

## 2014-11-21 DIAGNOSIS — M25561 Pain in right knee: Secondary | ICD-10-CM | POA: Diagnosis not present

## 2014-11-21 DIAGNOSIS — S86809S Unspecified injury of other muscle(s) and tendon(s) at lower leg level, unspecified leg, sequela: Secondary | ICD-10-CM | POA: Diagnosis not present

## 2014-11-23 DIAGNOSIS — S86809S Unspecified injury of other muscle(s) and tendon(s) at lower leg level, unspecified leg, sequela: Secondary | ICD-10-CM | POA: Diagnosis not present

## 2014-11-23 DIAGNOSIS — M25561 Pain in right knee: Secondary | ICD-10-CM | POA: Diagnosis not present

## 2014-11-23 DIAGNOSIS — M722 Plantar fascial fibromatosis: Secondary | ICD-10-CM | POA: Diagnosis not present

## 2014-11-28 DIAGNOSIS — M25561 Pain in right knee: Secondary | ICD-10-CM | POA: Diagnosis not present

## 2014-11-28 DIAGNOSIS — M722 Plantar fascial fibromatosis: Secondary | ICD-10-CM | POA: Diagnosis not present

## 2014-11-28 DIAGNOSIS — S86809S Unspecified injury of other muscle(s) and tendon(s) at lower leg level, unspecified leg, sequela: Secondary | ICD-10-CM | POA: Diagnosis not present

## 2014-11-30 DIAGNOSIS — M722 Plantar fascial fibromatosis: Secondary | ICD-10-CM | POA: Diagnosis not present

## 2014-11-30 DIAGNOSIS — M25561 Pain in right knee: Secondary | ICD-10-CM | POA: Diagnosis not present

## 2014-11-30 DIAGNOSIS — S86809S Unspecified injury of other muscle(s) and tendon(s) at lower leg level, unspecified leg, sequela: Secondary | ICD-10-CM | POA: Diagnosis not present

## 2014-12-05 DIAGNOSIS — M25561 Pain in right knee: Secondary | ICD-10-CM | POA: Diagnosis not present

## 2014-12-05 DIAGNOSIS — S86809S Unspecified injury of other muscle(s) and tendon(s) at lower leg level, unspecified leg, sequela: Secondary | ICD-10-CM | POA: Diagnosis not present

## 2014-12-05 DIAGNOSIS — M722 Plantar fascial fibromatosis: Secondary | ICD-10-CM | POA: Diagnosis not present

## 2014-12-12 DIAGNOSIS — M25561 Pain in right knee: Secondary | ICD-10-CM | POA: Diagnosis not present

## 2014-12-12 DIAGNOSIS — S86809S Unspecified injury of other muscle(s) and tendon(s) at lower leg level, unspecified leg, sequela: Secondary | ICD-10-CM | POA: Diagnosis not present

## 2014-12-12 DIAGNOSIS — M722 Plantar fascial fibromatosis: Secondary | ICD-10-CM | POA: Diagnosis not present

## 2014-12-14 DIAGNOSIS — S86811A Strain of other muscle(s) and tendon(s) at lower leg level, right leg, initial encounter: Secondary | ICD-10-CM | POA: Diagnosis not present

## 2014-12-14 DIAGNOSIS — M722 Plantar fascial fibromatosis: Secondary | ICD-10-CM | POA: Diagnosis not present

## 2014-12-14 DIAGNOSIS — Z681 Body mass index (BMI) 19 or less, adult: Secondary | ICD-10-CM | POA: Diagnosis not present

## 2014-12-19 DIAGNOSIS — M25561 Pain in right knee: Secondary | ICD-10-CM | POA: Diagnosis not present

## 2014-12-19 DIAGNOSIS — S86809S Unspecified injury of other muscle(s) and tendon(s) at lower leg level, unspecified leg, sequela: Secondary | ICD-10-CM | POA: Diagnosis not present

## 2014-12-19 DIAGNOSIS — M722 Plantar fascial fibromatosis: Secondary | ICD-10-CM | POA: Diagnosis not present

## 2015-01-16 DIAGNOSIS — Z862 Personal history of diseases of the blood and blood-forming organs and certain disorders involving the immune mechanism: Secondary | ICD-10-CM | POA: Diagnosis not present

## 2015-01-16 DIAGNOSIS — R0989 Other specified symptoms and signs involving the circulatory and respiratory systems: Secondary | ICD-10-CM | POA: Diagnosis not present

## 2015-01-16 DIAGNOSIS — Z8349 Family history of other endocrine, nutritional and metabolic diseases: Secondary | ICD-10-CM | POA: Diagnosis not present

## 2015-01-16 LAB — HEPATIC FUNCTION PANEL
ALT: 14 U/L (ref 10–40)
AST: 19 U/L (ref 14–40)
Alkaline Phosphatase: 56 U/L (ref 25–125)
Bilirubin, Total: 0.5 mg/dL

## 2015-01-16 LAB — LIPID PANEL
Cholesterol: 159 mg/dL (ref 0–200)
HDL: 77 mg/dL — AB (ref 35–70)
LDL Cholesterol: 60 mg/dL
LDl/HDL Ratio: 0.8
Triglycerides: 108 mg/dL (ref 40–160)

## 2015-01-16 LAB — BASIC METABOLIC PANEL
BUN: 20 mg/dL (ref 4–21)
Creatinine: 0.9 mg/dL (ref <=1.3)
Glucose: 90 mg/dL
Potassium: 5.1 mmol/L (ref 3.4–5.3)
Sodium: 141 mmol/L (ref 137–147)

## 2015-01-16 LAB — CBC AND DIFFERENTIAL
HCT: 40 % — AB (ref 41–53)
Hemoglobin: 13.6 g/dL (ref 13.5–17.5)
Neutrophils Absolute: 70 /uL
Platelets: 175 10*3/uL (ref 150–399)
WBC: 5.5 10^3/mL

## 2015-03-18 DIAGNOSIS — M9979 Connective tissue and disc stenosis of intervertebral foramina of abdomen and other regions: Secondary | ICD-10-CM | POA: Insufficient documentation

## 2015-03-18 DIAGNOSIS — R0989 Other specified symptoms and signs involving the circulatory and respiratory systems: Secondary | ICD-10-CM | POA: Insufficient documentation

## 2015-03-18 DIAGNOSIS — R002 Palpitations: Secondary | ICD-10-CM | POA: Insufficient documentation

## 2015-03-18 DIAGNOSIS — N486 Induration penis plastica: Secondary | ICD-10-CM | POA: Insufficient documentation

## 2015-03-18 DIAGNOSIS — Z8679 Personal history of other diseases of the circulatory system: Secondary | ICD-10-CM | POA: Insufficient documentation

## 2015-03-18 DIAGNOSIS — Z862 Personal history of diseases of the blood and blood-forming organs and certain disorders involving the immune mechanism: Secondary | ICD-10-CM | POA: Insufficient documentation

## 2015-03-18 DIAGNOSIS — K219 Gastro-esophageal reflux disease without esophagitis: Secondary | ICD-10-CM | POA: Insufficient documentation

## 2015-03-18 DIAGNOSIS — M549 Dorsalgia, unspecified: Secondary | ICD-10-CM

## 2015-03-18 DIAGNOSIS — I7300 Raynaud's syndrome without gangrene: Secondary | ICD-10-CM | POA: Insufficient documentation

## 2015-03-18 DIAGNOSIS — M1990 Unspecified osteoarthritis, unspecified site: Secondary | ICD-10-CM | POA: Insufficient documentation

## 2015-03-18 DIAGNOSIS — I10 Essential (primary) hypertension: Secondary | ICD-10-CM | POA: Insufficient documentation

## 2015-03-18 DIAGNOSIS — I459 Conduction disorder, unspecified: Secondary | ICD-10-CM | POA: Insufficient documentation

## 2015-03-18 DIAGNOSIS — G8929 Other chronic pain: Secondary | ICD-10-CM | POA: Insufficient documentation

## 2015-03-26 DIAGNOSIS — H33323 Round hole, bilateral: Secondary | ICD-10-CM | POA: Diagnosis not present

## 2015-03-26 DIAGNOSIS — H43813 Vitreous degeneration, bilateral: Secondary | ICD-10-CM | POA: Diagnosis not present

## 2015-03-30 ENCOUNTER — Telehealth: Payer: Self-pay | Admitting: Family Medicine

## 2015-03-30 NOTE — Telephone Encounter (Signed)
Pt stated he needed to speak with [facility] about following up with [facility] about lab results. Thanks TNP

## 2015-03-30 NOTE — Telephone Encounter (Signed)
Spoke with patient. Patient had labs done in [facility]scripts on march 29th and diagnoses used for Lipid did not get covered. Called billing at [place] and gave them DX we used last year and hope that will cover it. Advised patient that they are processing that now and if we can help any other way to let us know.

## 2015-04-26 ENCOUNTER — Other Ambulatory Visit: Payer: Self-pay

## 2015-04-26 ENCOUNTER — Telehealth: Payer: Self-pay

## 2015-04-26 MED ORDER — OXYCODONE HCL 15 MG PO TABS: 15.0000 mg | ORAL_TABLET | Freq: Three times a day (TID) | ORAL | Status: DC

## 2015-04-26 NOTE — Telephone Encounter (Signed)
Patient calling to get Refill on this medication. We told patient on his last visit since he had CPE in august instead of coming in this month and then next month just to call this month for refill on Oxycodone and then would get the other 2 RXs during his OV in August. I already pulled down the order for you whenever you review it. Please let me know so i can call patient back. Thank you-aa

## 2015-04-26 NOTE — Telephone Encounter (Signed)
That will work. Thanks

## 2015-04-26 NOTE — Telephone Encounter (Signed)
Printed and pt advised-aa

## 2015-04-26 NOTE — Telephone Encounter (Signed)
Patient is calling and states he has been summoned for jury duty on August 1st. He states that he has trouble sitting for certain periods of time-after 20 minutes after sitting still. He states he has arthritis in his back, spondylosis, and bulging discs. He understands if you can not but he is just having anxiety from him having to stand up so frequent in front of everyone like that. Thank you.-aa

## 2015-05-25 ENCOUNTER — Other Ambulatory Visit: Payer: Self-pay | Admitting: Family Medicine

## 2015-06-05 ENCOUNTER — Ambulatory Visit (INDEPENDENT_AMBULATORY_CARE_PROVIDER_SITE_OTHER): Payer: Medicare Other | Admitting: Family Medicine

## 2015-06-05 ENCOUNTER — Encounter: Payer: Self-pay | Admitting: Family Medicine

## 2015-06-05 VITALS — BP 108/62 | HR 60 | Temp 98.0°F | Resp 16 | Ht 73.0 in | Wt 160.0 lb

## 2015-06-05 DIAGNOSIS — M5441 Lumbago with sciatica, right side: Secondary | ICD-10-CM | POA: Diagnosis not present

## 2015-06-05 DIAGNOSIS — R351 Nocturia: Secondary | ICD-10-CM | POA: Diagnosis not present

## 2015-06-05 DIAGNOSIS — Z0000 Encounter for general adult medical examination without abnormal findings: Secondary | ICD-10-CM

## 2015-06-05 DIAGNOSIS — I10 Essential (primary) hypertension: Secondary | ICD-10-CM

## 2015-06-05 LAB — IFOBT (OCCULT BLOOD): IFOBT: NEGATIVE

## 2015-06-05 MED ORDER — OXYCODONE HCL 15 MG PO TABS: 15.0000 mg | ORAL_TABLET | Freq: Three times a day (TID) | ORAL | Status: DC | PRN

## 2015-06-05 MED ORDER — TADALAFIL 5 MG PO TABS: 5.0000 mg | ORAL_TABLET | Freq: Every day | ORAL | Status: DC | PRN

## 2015-06-05 MED ORDER — METOPROLOL SUCCINATE ER 50 MG PO TB24: 50.0000 mg | ORAL_TABLET | Freq: Every day | ORAL | Status: DC

## 2015-06-05 NOTE — Progress Notes (Signed)
Patient ID: AVERILL PONS, male   DOB: 1946/08/20, 69 y.o.   MRN: [id number] Patient: Daniel Owens, Male    DOB: [birth year], 69 y.o.   MRN: [id number] Visit Date: 06/05/2015  Today's Provider: [name], MD   Chief Complaint  Patient presents with  . Medicare Wellness   Subjective:  Daniel Owens is a 69 y.o. male who presents today for his Subsequent Annual Wellness Visit. He feels well. He reports exercising 3 times per week. He reports he is sleeping fairly well. Patient would like to deal with other issues in addition to his wellness. BPH--patient has chronic BPH symptoms with nocturia 3-4 and decreased stream. Tamsulosin helps" a little". Finasteride bothers his sex life. He does not like taking it. The Silas daily helps his BPH symptoms and his ED he prefers taking this but it is no longer covered by insurance. I told him I would document this for him but I was not sure will be covered or not. Patient has chronic neck and back and shoulder pain in the refill of his chronic medications which he does not overuse. He has not called back and all early for any refills on oxycodone. Reflux is controlled. Hypertension is controlled. Atrial this is a nonissue according to patient. Review of Systems  Constitutional: Negative for fever, chills, diaphoresis, activity change, appetite change, fatigue and unexpected weight change.  HENT: Negative for congestion, dental problem, drooling, ear discharge, ear pain, facial swelling, hearing loss, mouth sores, nosebleeds, postnasal drip, rhinorrhea, sinus pressure, sneezing, sore throat, tinnitus, trouble swallowing and voice change.   Eyes: Negative for photophobia, pain, discharge, redness, itching and visual disturbance.  Respiratory: Negative for apnea, cough, choking, chest tightness, shortness of breath, wheezing and stridor.   Cardiovascular: Positive for palpitations and leg swelling. Negative for chest pain.  Gastrointestinal: Negative for  nausea, vomiting, abdominal pain, diarrhea, constipation, blood in stool, abdominal distention, anal bleeding and rectal pain.  Endocrine: Positive for cold intolerance. Negative for heat intolerance, polydipsia, polyphagia and polyuria.  Genitourinary: Positive for urgency. Negative for dysuria, frequency, hematuria, flank pain, decreased urine volume, discharge, penile swelling, scrotal swelling, enuresis, difficulty urinating, genital sores, penile pain and testicular pain.  Musculoskeletal: Positive for back pain, neck pain and neck stiffness. Negative for myalgias, joint swelling, arthralgias and gait problem.  Skin: Negative for color change, pallor, rash and wound.  Allergic/Immunologic: Negative for environmental allergies, food allergies and immunocompromised state.  Neurological: Positive for numbness. Negative for dizziness, tremors, seizures, syncope, facial asymmetry, speech difficulty, weakness, light-headedness and headaches.  Hematological: Negative for adenopathy. Bruises/bleeds easily.  Psychiatric/Behavioral: Negative for suicidal ideas, hallucinations, behavioral problems, confusion, sleep disturbance, self-injury, dysphoric mood, decreased concentration and agitation. The patient is not nervous/anxious and is not hyperactive.     Patient Active Problem List   Diagnosis Date Noted  . Esophageal reflux 03/18/2015  . Arthritis 03/18/2015  . Back pain, chronic 03/18/2015  . Cardiac conduction disorder 03/18/2015  . Narrowing of intervertebral disc space 03/18/2015  . Essential (primary) hypertension 03/18/2015  . History of anemia 03/18/2015  . History of atrial fibrillation 03/18/2015  . Weak pulse 03/18/2015  . Awareness of heartbeats 03/18/2015  . Peyronie's disease 03/18/2015  . Raynaud's syndrome 03/18/2015    Social History   Social History  . Marital Status: Married    Spouse Name: N/A  . Number of Children: N/A  . Years of Education: N/A   Occupational  History  . Not on file.   Social  History Main Topics  . Smoking status: Former Smoker    Quit date: 10/19/1977  . Smokeless tobacco: Not on file  . Alcohol Use: 0.0 oz/week    0 Standard drinks or equivalent per week     Comment: very seldom  . Drug Use: No  . Sexual Activity: Not on file   Other Topics Concern  . Not on file   Social History Narrative    Past Surgical History  Procedure Laterality Date  . Shoulder surgery      Right-x 3 overall  . Neck surgery      involving disc  . Hernia repair    . Bilateral knee arthroscopy    . Tendon repair      involving Tricep tendon  . Appendectomy      His family history includes Allergic rhinitis in his brother; Prostate cancer in his maternal uncle and paternal uncle; Stroke in his mother; Throat cancer in his father; Uterine cancer in his mother.    Outpatient Prescriptions Prior to Visit  Medication Sig Dispense Refill  . beta carotene 25000 UNIT capsule Take 25,000 Units by mouth daily.    . calcium carbonate (OS-CAL) 600 MG TABS tablet Take 600 mg by mouth daily with breakfast.    . celecoxib (CELEBREX) 200 MG capsule Take 200 mg by mouth daily.    . cholecalciferol (VITAMIN D) 1000 UNITS tablet Take 1,000 Units by mouth daily.    Marland Kitchen GLUCOSAMINE-CHONDROIT-VIT C-MN PO Take by mouth.    . metoprolol succinate (TOPROL-XL) 50 MG 24 hr tablet 1 TABLET ER 24HR, ORAL, DAILY 30 tablet 5  . omeprazole (PRILOSEC) 20 MG capsule Take by mouth.    . Selenium 200 MCG TABS Take 200 mcg by mouth daily.    Marland Kitchen VITAMIN E PO Take 1 capsule by mouth daily.    . Ascorbic Acid (VITAMIN C) 1000 MG tablet Take 1,000 mg by mouth daily.    . B Complex Vitamins (VITAMIN B COMPLEX PO) Take 1 tablet by mouth daily.    . cyclobenzaprine (FLEXERIL) 10 MG tablet Take by mouth.    . fish oil-omega-3 fatty acids 1000 MG capsule Take 1 g by mouth daily.    . Multiple Vitamin (MULTIVITAMIN WITH MINERALS) TABS tablet Take 1 tablet by mouth daily.    Marland Kitchen  oxyCODONE (ROXICODONE) 15 MG immediate release tablet Take by mouth.    . oxyCODONE (ROXICODONE) 15 MG immediate release tablet Take 1 tablet (15 mg total) by mouth 3 (three) times daily. (Patient not taking: Reported on 06/05/2015) 90 tablet 0  . pentoxifylline (TRENTAL) 400 MG CR tablet Take 400 mg by mouth 3 (three) times daily with meals.    . tadalafil (CIALIS) 5 MG tablet Take 5 mg by mouth daily as needed for erectile dysfunction.    . tadalafil (CIALIS) 5 MG tablet Take by mouth.     No facility-administered medications prior to visit.    No Known Allergies  Patient Care Team: Jerrol Banana., MD as PCP - General (Family Medicine)  Objective:   Vitals:  Filed Vitals:   06/05/15 1004  BP: 108/62  Pulse: 60  Temp: 98 F (36.7 C)  TempSrc: Oral  Resp: 16  Height: 6\' 1"  (1.854 m)  Weight: 160 lb (72.576 kg)    Physical Exam  Constitutional: He is oriented to person, place, and time. He appears well-developed and well-nourished.  HENT:  Head: Normocephalic and atraumatic.  Right Ear: External ear normal.  Left  Ear: External ear normal.  Nose: Nose normal.  Mouth/Throat: Oropharynx is clear and moist.  Eyes: Conjunctivae and EOM are normal. Pupils are equal, round, and reactive to light.  Neck: Normal range of motion. Neck supple.  Cardiovascular: Normal rate, regular rhythm, normal heart sounds and intact distal pulses.   Pulmonary/Chest: Effort normal and breath sounds normal.  Abdominal: Soft. Bowel sounds are normal.  Genitourinary: Rectum normal, prostate normal and penis normal.  Musculoskeletal: Normal range of motion.  Neurological: He is alert and oriented to person, place, and time.  Grossly nonfocal.  Skin: Skin is warm and dry.  Psychiatric: He has a normal mood and affect. His behavior is normal. Judgment and thought content normal.    Activities of Daily Living In your present state of health, do you have any difficulty performing the following  activities: 06/05/2015 06/05/2015  Hearing? N N  Vision? N -  Difficulty concentrating or making decisions? N N  Walking or climbing stairs? N -  Dressing or bathing? N N  Doing errands, shopping? N N    Fall Risk Assessment Fall Risk  06/05/2015  Falls in the past year? No     Depression Screen PHQ 2/9 Scores 06/05/2015  PHQ - 2 Score 0    Cognitive Testing - 6-CIT    Year: 0 4 points  Month: 0 3 points  Memorize "Pia Mau, [address], [place]"  Time (within 1 hour:) 0 3 points  Count backwards from 20: 0 2 4 points  Name months of year: 0 2 4 points  Repeat Address: 0 2 4 6 8 10  points   Total Score: 4/28  Interpretation : Normal (0-7) Abnormal (8-28)    Assessment & Plan:     Annual Wellness Visit  Reviewed patient's Family Medical History Reviewed and updated list of patient's medical providers Assessment of cognitive impairment was done Assessed patient's functional ability Established a written schedule for health screening [place] Completed and Reviewed  Exercise Activities and Dietary recommendations Goals    None      Immunization History  Administered Date(s) Administered  . Pneumococcal Conjugate-13 08/08/2014  . Pneumococcal Polysaccharide-23 11/15/2012  . Zoster 04/09/2010    Health Maintenance  Topic Date Due  . Hepatitis C Screening  Jun 14, 1946  . TETANUS/TDAP  07/03/1966  . INFLUENZA VACCINE  05/21/2015  . COLONOSCOPY  12/25/2022  . ZOSTAVAX  Completed  . PNA vac Low Risk Adult  Completed      Discussed health benefits of physical activity, and encouraged him to engage in regular exercise appropriate for his age and condition.    Miguel Aschoff MD [facility] 06/05/2015 10:12 AM  ------------------------------------------------------------------------------------------------------------  1. Medicare annual wellness visit, subsequent   2.  Nocturia/BPH Tamulosin daily and see how much his symptoms are helped. Patient would like a silent prescription.   3. Low back pain with right-sided sciatica, unspecified back pain laterality Chronic.  4. Essential hypertension  I have done the exam and reviewed the above chart and it is accurate to the best of my knowledge.

## 2015-06-06 LAB — CBC WITH DIFFERENTIAL/PLATELET

## 2015-06-06 LAB — TSH: TSH: 1.03 u[IU]/mL (ref 0.450–4.500)

## 2015-06-06 LAB — PSA: Prostate Specific Ag, Serum: 0.8 ng/mL (ref 0.0–4.0)

## 2015-06-07 ENCOUNTER — Telehealth: Payer: Self-pay | Admitting: Family Medicine

## 2015-06-07 LAB — COMPREHENSIVE METABOLIC PANEL
ALT: 14 IU/L (ref 0–44)
AST: 20 IU/L (ref 0–40)
Albumin/Globulin Ratio: 2.1 (ref 1.1–2.5)
Albumin: 4.4 g/dL (ref 3.6–4.8)
Alkaline Phosphatase: 52 IU/L (ref 39–117)
BUN/Creatinine Ratio: 26 — ABNORMAL HIGH (ref 10–22)
BUN: 24 mg/dL (ref 8–27)
Bilirubin Total: 0.3 mg/dL (ref 0.0–1.2)
CO2: 22 mmol/L (ref 18–29)
Calcium: 9.2 mg/dL (ref 8.6–10.2)
Chloride: 98 mmol/L (ref 97–108)
Creatinine, Ser: 0.92 mg/dL (ref 0.76–1.27)
GFR calc Af Amer: 99 mL/min/{1.73_m2} (ref >59)
GFR calc non Af Amer: 86 mL/min/{1.73_m2} (ref >59)
Globulin, Total: 2.1 g/dL (ref 1.5–4.5)
Glucose: 85 mg/dL (ref 65–99)
Potassium: 4.7 mmol/L (ref 3.5–5.2)
Sodium: 139 mmol/L (ref 134–144)
Total Protein: 6.5 g/dL (ref 6.0–8.5)

## 2015-06-07 LAB — SPECIMEN STATUS REPORT

## 2015-06-07 NOTE — Telephone Encounter (Signed)
Pt is returning call.  YZ#[phone]/MM

## 2015-06-28 NOTE — Telephone Encounter (Signed)
LMTCB-Cialis was denied because alternatives have not been tried. Patient needs to try Tamsulosin per Dr. [name]. If patient agrees this need to be sent, if patient wants to just wait that is fine also.-aa

## 2015-07-10 ENCOUNTER — Telehealth: Payer: Self-pay | Admitting: Family Medicine

## 2015-07-10 NOTE — Telephone Encounter (Signed)
Discussed with patient that Cialis is not been covered before other medications are tried. Patient has tried Fenasteride and Flomax in 2014 per Dr. Gareth Eagle and we have notes on file from him. Will call insurance back. Pending=aa

## 2015-07-10 NOTE — Telephone Encounter (Signed)
Pt states he is returning [place] call.  CB#[phone]/MW

## 2015-07-11 NOTE — Telephone Encounter (Signed)
Called insurance and form is on the way for [place] to fill this=aa

## 2015-07-17 NOTE — Telephone Encounter (Signed)
Form was filled out and we are waiting for response from insurance-aa

## 2015-07-30 ENCOUNTER — Ambulatory Visit (INDEPENDENT_AMBULATORY_CARE_PROVIDER_SITE_OTHER): Payer: Medicare Other | Admitting: Family Medicine

## 2015-07-30 VITALS — BP 126/60 | HR 68 | Temp 98.3°F | Resp 16 | Wt 163.0 lb

## 2015-07-30 DIAGNOSIS — Z23 Encounter for immunization: Secondary | ICD-10-CM

## 2015-07-30 DIAGNOSIS — G8929 Other chronic pain: Secondary | ICD-10-CM

## 2015-07-30 DIAGNOSIS — M5441 Lumbago with sciatica, right side: Secondary | ICD-10-CM | POA: Diagnosis not present

## 2015-07-30 DIAGNOSIS — I10 Essential (primary) hypertension: Secondary | ICD-10-CM | POA: Diagnosis not present

## 2015-07-30 MED ORDER — OXYCODONE HCL 15 MG PO TABS: 15.0000 mg | ORAL_TABLET | Freq: Three times a day (TID) | ORAL | Status: DC

## 2015-07-30 MED ORDER — OXYCODONE HCL 15 MG PO TABS: 15.0000 mg | ORAL_TABLET | Freq: Three times a day (TID) | ORAL | Status: DC | PRN

## 2015-07-30 NOTE — Progress Notes (Signed)
Patient ID: Daniel Owens, male   DOB: 06/28/1946, 69 y.o.   MRN: [id number]   WILBER FINI  MRN: [id number] DOB: 1946/02/26  Subjective:  HPI   1. Chronic pain Patient is a 69 year old male who presents for refills for his narcotic pain medication for chronic pain.  Patient states there has not been any changes in his pain.     Patient Active Problem List   Diagnosis Date Noted  . Esophageal reflux 03/18/2015  . Arthritis 03/18/2015  . Back pain, chronic 03/18/2015  . Cardiac conduction disorder 03/18/2015  . Narrowing of intervertebral disc space 03/18/2015  . Essential (primary) hypertension 03/18/2015  . History of anemia 03/18/2015  . History of atrial fibrillation 03/18/2015  . Weak pulse 03/18/2015  . Awareness of heartbeats 03/18/2015  . Peyronie's disease 03/18/2015  . Raynaud's syndrome 03/18/2015    No past medical history on file.  Social History   Social History  . Marital Status: Married    Spouse Name: N/A  . Number of Children: N/A  . Years of Education: N/A   Occupational History  . Not on file.   Social History Main Topics  . Smoking status: Former Smoker    Quit date: 10/19/1977  . Smokeless tobacco: Not on file  . Alcohol Use: 0.0 oz/week    0 Standard drinks or equivalent per week     Comment: very seldom  . Drug Use: No  . Sexual Activity: Not on file   Other Topics Concern  . Not on file   Social History Narrative    Outpatient Prescriptions Prior to Visit  Medication Sig Dispense Refill  . Ascorbic Acid (VITAMIN C) 1000 MG tablet Take 1,000 mg by mouth daily.    . B Complex Vitamins (VITAMIN B COMPLEX PO) Take 1 tablet by mouth daily.    . beta carotene 25000 UNIT capsule Take 25,000 Units by mouth daily.    . calcium carbonate (OS-CAL) 600 MG TABS tablet Take 600 mg by mouth daily with breakfast.    . celecoxib (CELEBREX) 200 MG capsule Take 200 mg by mouth daily.    . cholecalciferol (VITAMIN D) 1000 UNITS tablet Take 1,000 Units  by mouth daily.    . cyclobenzaprine (FLEXERIL) 10 MG tablet Take by mouth.    . fish oil-omega-3 fatty acids 1000 MG capsule Take 1 g by mouth daily.    Marland Kitchen GLUCOSAMINE-CHONDROIT-VIT C-MN PO Take by mouth.    . metoprolol succinate (TOPROL-XL) 50 MG 24 hr tablet Take 1 tablet (50 mg total) by mouth daily. Take with or immediately following a meal. 90 tablet 5  . Multiple Vitamin (MULTIVITAMIN WITH MINERALS) TABS tablet Take 1 tablet by mouth daily.    Marland Kitchen omeprazole (PRILOSEC) 20 MG capsule Take by mouth.    . oxyCODONE (ROXICODONE) 15 MG immediate release tablet Take 1 tablet (15 mg total) by mouth 3 (three) times daily. 90 tablet 0  . oxyCODONE (ROXICODONE) 15 MG immediate release tablet Take 1 tablet (15 mg total) by mouth every 8 (eight) hours as needed for pain. 90 tablet 0  . oxyCODONE (ROXICODONE) 15 MG immediate release tablet Take 1 tablet (15 mg total) by mouth every 8 (eight) hours as needed for pain. 90 tablet 0  . pentoxifylline (TRENTAL) 400 MG CR tablet Take 400 mg by mouth 3 (three) times daily with meals.    . Selenium 200 MCG TABS Take 200 mcg by mouth daily.    . tadalafil (CIALIS)  5 MG tablet Take 1 tablet (5 mg total) by mouth daily as needed for erectile dysfunction. 30 tablet 12  . VITAMIN E PO Take 1 capsule by mouth daily.    . Ascorbic Acid (VITAMIN C) 1000 MG tablet Take by mouth.    . celecoxib (CELEBREX) 200 MG capsule Take by mouth.    . metoprolol succinate (TOPROL-XL) 50 MG 24 hr tablet     . Multiple Vitamin (MULTI-VITAMINS) TABS Take by mouth.    . tadalafil (CIALIS) 5 MG tablet Take 5 mg by mouth daily as needed for erectile dysfunction.    . tadalafil (CIALIS) 5 MG tablet Take by mouth.     No facility-administered medications prior to visit.    No Known Allergies  Review of Systems  Constitutional: Negative for fever, chills and malaise/fatigue.  Eyes: Negative.   Respiratory: Negative for cough, hemoptysis, sputum production, shortness of breath and  wheezing.   Cardiovascular: Positive for leg swelling. Negative for chest pain, palpitations and orthopnea.  Gastrointestinal: Negative.   Musculoskeletal: Positive for myalgias, back pain, joint pain and neck pain.       Chronic but unchanged  Neurological: Negative for dizziness and headaches.  Psychiatric/Behavioral: Negative.    Objective:  BP 126/60 mmHg  Pulse 68  Temp(Src) 98.3 F (36.8 C) (Oral)  Resp 16  Wt 163 lb (73.936 kg)  Physical Exam  Constitutional: He is oriented to person, place, and time and well-developed, well-nourished, and in no distress.  HENT:  Head: Normocephalic and atraumatic.  Right Ear: External ear normal.  Left Ear: External ear normal.  Nose: Nose normal.  Eyes: Conjunctivae are normal.  Neck: Neck supple.  Cardiovascular: Normal rate, regular rhythm and normal heart sounds.   Pulmonary/Chest: Effort normal and breath sounds normal.  Abdominal: Soft.  Neurological: He is alert and oriented to person, place, and time.  Skin: Skin is warm and dry.  Psychiatric: Mood, memory, affect and judgment normal.    Assessment and Plan :  Chronic pain Known DDD New pain of right shoulder.Ortho f/u for shoulder. Refill Oxycodone times 3 Rx. HTN I have done the exam and reviewed the above chart and it is accurate to the best of my knowledge.  Miguel Aschoff MD [facility] 07/30/2015 11:14 AM

## 2015-07-31 LAB — CBC WITH DIFFERENTIAL/PLATELET
Basophils Absolute: 0 10*3/uL (ref 0.0–0.2)
Basos: 0 %
EOS (ABSOLUTE): 0 10*3/uL (ref 0.0–0.4)
Eos: 1 %
Hematocrit: 37.6 % (ref 37.5–51.0)
Hemoglobin: 12.6 g/dL (ref 12.6–17.7)
Immature Grans (Abs): 0 10*3/uL (ref 0.0–0.1)
Immature Granulocytes: 0 %
Lymphocytes Absolute: 0.9 10*3/uL (ref 0.7–3.1)
Lymphs: 18 %
MCH: 34.9 pg — ABNORMAL HIGH (ref 26.6–33.0)
MCHC: 33.5 g/dL (ref 31.5–35.7)
MCV: 104 fL — ABNORMAL HIGH (ref 79–97)
Monocytes Absolute: 0.7 10*3/uL (ref 0.1–0.9)
Monocytes: 13 %
Neutrophils Absolute: 3.3 10*3/uL (ref 1.4–7.0)
Neutrophils: 68 %
Platelets: 179 10*3/uL (ref 150–379)
RBC: 3.61 x10E6/uL — ABNORMAL LOW (ref 4.14–5.80)
RDW: 13.3 % (ref 12.3–15.4)
WBC: 4.9 10*3/uL (ref 3.4–10.8)

## 2015-08-01 ENCOUNTER — Other Ambulatory Visit: Payer: Self-pay | Admitting: Emergency Medicine

## 2015-08-01 ENCOUNTER — Other Ambulatory Visit: Payer: Self-pay | Admitting: Family Medicine

## 2015-08-01 DIAGNOSIS — D7589 Other specified diseases of blood and blood-forming organs: Secondary | ICD-10-CM

## 2015-08-01 DIAGNOSIS — E538 Deficiency of other specified B group vitamins: Secondary | ICD-10-CM

## 2015-08-02 LAB — VITAMIN B12: Vitamin B-12: 946 pg/mL (ref 211–946)

## 2015-08-03 ENCOUNTER — Encounter: Payer: Self-pay | Admitting: Family Medicine

## 2015-09-02 ENCOUNTER — Other Ambulatory Visit: Payer: Self-pay | Admitting: Family Medicine

## 2015-10-19 ENCOUNTER — Encounter: Payer: Self-pay | Admitting: Physician Assistant

## 2015-10-19 ENCOUNTER — Ambulatory Visit (INDEPENDENT_AMBULATORY_CARE_PROVIDER_SITE_OTHER): Payer: Medicare Other | Admitting: Physician Assistant

## 2015-10-19 VITALS — BP 108/46 | HR 58 | Temp 98.2°F | Resp 14 | Wt 166.0 lb

## 2015-10-19 DIAGNOSIS — J0100 Acute maxillary sinusitis, unspecified: Secondary | ICD-10-CM

## 2015-10-19 MED ORDER — AMOXICILLIN-POT CLAVULANATE 875-125 MG PO TABS: 1.0000 | ORAL_TABLET | Freq: Two times a day (BID) | ORAL | Status: DC

## 2015-10-19 NOTE — Patient Instructions (Signed)

## 2015-10-19 NOTE — Progress Notes (Signed)
Patient ID: Daniel Owens, male   DOB: [birth year], 69 y.o.   MRN: [id number]       Patient: Daniel Owens Male    DOB: Jan 20, 1946   69 y.o.   MRN: [id number] Visit Date: 10/19/2015  Today's Provider: Mar Daring, PA-C   Chief Complaint  Patient presents with  . Sinus Problem   Subjective:    HPI  Patient has had trouble with sinuses for 5 days now. Symptoms include: sinus pressure and pain especially behind right eye, headache, some lightheadedness, nasal congestion especially at night and it makes it hard for patient to sleep, runny nose, watery eyes, post nasal drainage and slight cough. He has been blowing his nose and has had bloody/greenish discharge come from that. He has taking Ibuprofen and takes Oxycodone but that has not helped his symptoms. No fever that he knows of.    No Known Allergies Previous Medications   ASCORBIC ACID (VITAMIN C) 1000 MG TABLET    Take 1,000 mg by mouth daily.   B COMPLEX VITAMINS (VITAMIN B COMPLEX PO)    Take 1 tablet by mouth daily.   BETA CAROTENE 36644 UNIT CAPSULE    Take 25,000 Units by mouth daily.   CALCIUM CARBONATE (OS-CAL) 600 MG TABS TABLET    Take 600 mg by mouth daily with breakfast.   CELECOXIB (CELEBREX) 200 MG CAPSULE    Take 200 mg by mouth daily.   CHOLECALCIFEROL (VITAMIN D) 1000 UNITS TABLET    Take 1,000 Units by mouth daily.   CYCLOBENZAPRINE (FLEXERIL) 10 MG TABLET    Take by mouth.   FISH OIL-OMEGA-3 FATTY ACIDS 1000 MG CAPSULE    Take 1 g by mouth daily.   GLUCOSAMINE-CHONDROIT-VIT C-MN PO    Take by mouth.   METOPROLOL SUCCINATE (TOPROL-XL) 50 MG 24 HR TABLET    Take 1 tablet (50 mg total) by mouth daily. Take with or immediately following a meal.   MULTIPLE VITAMIN (MULTIVITAMIN WITH MINERALS) TABS TABLET    Take 1 tablet by mouth daily.   OMEPRAZOLE (PRILOSEC) 20 MG CAPSULE    Take by mouth.   OXYCODONE (ROXICODONE) 15 MG IMMEDIATE RELEASE TABLET    Take 1 tablet (15 mg total) by mouth every 8 (eight) hours as  needed for pain.   OXYCODONE (ROXICODONE) 15 MG IMMEDIATE RELEASE TABLET    Take 1 tablet (15 mg total) by mouth every 8 (eight) hours as needed for pain.   OXYCODONE (ROXICODONE) 15 MG IMMEDIATE RELEASE TABLET    Take 1 tablet (15 mg total) by mouth 3 (three) times daily.   PENTOXIFYLLINE (TRENTAL) 400 MG CR TABLET    TAKE 1 TABLET THREE TIMES A DAY   SELENIUM 200 MCG TABS    Take 200 mcg by mouth daily.   TADALAFIL (CIALIS) 5 MG TABLET    Take 1 tablet (5 mg total) by mouth daily as needed for erectile dysfunction.   VITAMIN E PO    Take 1 capsule by mouth daily.    Review of Systems  Constitutional: Positive for fatigue. Negative for fever and chills.  HENT: Positive for congestion, postnasal drip, rhinorrhea, sinus pressure, sore throat and voice change.   Eyes: Positive for discharge.  Respiratory: Positive for cough.   Cardiovascular: Negative.   Gastrointestinal: Negative for nausea, vomiting and abdominal pain.  Musculoskeletal: Positive for back pain and arthralgias.  Neurological: Positive for headaches. Negative for dizziness and light-headedness.    Social History  Substance Use  Topics  . Smoking status: Former Smoker    Quit date: 10/19/1977  . Smokeless tobacco: Not on file  . Alcohol Use: 0.0 oz/week    0 Standard drinks or equivalent per week     Comment: very seldom   Objective:   BP 108/46 mmHg  Pulse 58  Temp(Src) 98.2 F (36.8 C)  Resp 14  Wt 166 lb (75.297 kg)  Physical Exam  Constitutional: He appears well-developed and well-nourished. No distress.  HENT:  Head: Normocephalic and atraumatic.  Right Ear: Hearing, external ear and ear canal normal. Tympanic membrane is not erythematous and not bulging. A middle ear effusion is present.  Left Ear: Hearing, external ear and ear canal normal. Tympanic membrane is not erythematous and not bulging. A middle ear effusion is present.  Nose: Mucosal edema and rhinorrhea present. Right sinus exhibits maxillary  sinus tenderness. Right sinus exhibits no frontal sinus tenderness. Left sinus exhibits maxillary sinus tenderness. Left sinus exhibits no frontal sinus tenderness.  Mouth/Throat: Uvula is midline, oropharynx is clear and moist and mucous membranes are normal. No oropharyngeal exudate, posterior oropharyngeal edema or posterior oropharyngeal erythema.  Eyes: Conjunctivae and EOM are normal. Pupils are equal, round, and reactive to light. Right eye exhibits no discharge. Left eye exhibits no discharge.  Neck: Normal range of motion. Neck supple. No tracheal deviation present. No Brudzinski's sign and no Kernig's sign noted. No thyromegaly present.  Cardiovascular: Normal rate, regular rhythm and normal heart sounds.  Exam reveals no gallop and no friction rub.   No murmur heard. Pulmonary/Chest: Effort normal and breath sounds normal. No stridor. No respiratory distress. He has no wheezes. He has no rales.  Lymphadenopathy:    He has no cervical adenopathy.  Skin: Skin is warm and dry. He is not diaphoretic.  Vitals reviewed.       Assessment & Plan:     1. Acute maxillary sinusitis, recurrence not specified Worsening symptoms. I will treat with Augmentin as below. I did advise that he may take Coricidin HBP for congestion. He may continue to use his nasal spray at night to help him sleep. I did advise him to make sure to stay well-hydrated and get plenty of rest. He is to call the office if symptoms fail to improve or worsen. - amoxicillin-clavulanate (AUGMENTIN) 875-125 MG tablet; Take 1 tablet by mouth 2 (two) times daily.  Dispense: 20 tablet; Refill: 0       Mar Daring, PA-C  [facility]

## 2015-10-24 ENCOUNTER — Ambulatory Visit (INDEPENDENT_AMBULATORY_CARE_PROVIDER_SITE_OTHER): Payer: Medicare Other | Admitting: Family Medicine

## 2015-10-24 ENCOUNTER — Encounter: Payer: Self-pay | Admitting: Family Medicine

## 2015-10-24 VITALS — BP 130/60 | HR 62 | Temp 97.7°F | Resp 14 | Wt 166.2 lb

## 2015-10-24 DIAGNOSIS — M5441 Lumbago with sciatica, right side: Secondary | ICD-10-CM

## 2015-10-24 DIAGNOSIS — M1710 Unilateral primary osteoarthritis, unspecified knee: Secondary | ICD-10-CM

## 2015-10-24 DIAGNOSIS — M179 Osteoarthritis of knee, unspecified: Secondary | ICD-10-CM | POA: Diagnosis not present

## 2015-10-24 MED ORDER — OXYCODONE HCL 15 MG PO TABS: 15.0000 mg | ORAL_TABLET | Freq: Three times a day (TID) | ORAL | Status: DC | PRN

## 2015-10-24 NOTE — Progress Notes (Signed)
Patient ID: Daniel Owens, male   DOB: [birth year], 70 y.o.   MRN: [id number]   Patient: Daniel Owens Male    DOB: 07-Jun-1946   70 y.o.   MRN: [id number] Visit Date: 10/24/2015  Today's Provider: [name], MD   Chief Complaint  Patient presents with  . Pain  . Back Pain   Subjective:    Back Pain This is a chronic problem. The problem occurs constantly. The pain is at a severity of 4/10. Worse during: worse early mornings. Stiffness is present in the morning. Treatments tried: Oxycodone 15 mg. The treatment provided significant relief.      Previous Medications   AMOXICILLIN-CLAVULANATE (AUGMENTIN) 875-125 MG TABLET    Take 1 tablet by mouth 2 (two) times daily.   ASCORBIC ACID (VITAMIN C) 1000 MG TABLET    Take 1,000 mg by mouth daily.   B COMPLEX VITAMINS (VITAMIN B COMPLEX PO)    Take 1 tablet by mouth daily.   BETA CAROTENE 16109 UNIT CAPSULE    Take 25,000 Units by mouth daily.   CALCIUM CARBONATE (OS-CAL) 600 MG TABS TABLET    Take 600 mg by mouth daily with breakfast.   CELECOXIB (CELEBREX) 200 MG CAPSULE    Take 200 mg by mouth daily.   CHOLECALCIFEROL (VITAMIN D) 1000 UNITS TABLET    Take 1,000 Units by mouth daily.   CYCLOBENZAPRINE (FLEXERIL) 10 MG TABLET    Take by mouth. Reported on 10/24/2015   FISH OIL-OMEGA-3 FATTY ACIDS 1000 MG CAPSULE    Take 1 g by mouth daily.   GLUCOSAMINE-CHONDROIT-VIT C-MN PO    Take by mouth.   METOPROLOL SUCCINATE (TOPROL-XL) 50 MG 24 HR TABLET    Take 1 tablet (50 mg total) by mouth daily. Take with or immediately following a meal.   MULTIPLE VITAMIN (MULTIVITAMIN WITH MINERALS) TABS TABLET    Take 1 tablet by mouth daily.   OMEPRAZOLE (PRILOSEC) 20 MG CAPSULE    Take by mouth.   OXYCODONE (ROXICODONE) 15 MG IMMEDIATE RELEASE TABLET    Take 1 tablet (15 mg total) by mouth every 8 (eight) hours as needed for pain.   PENTOXIFYLLINE (TRENTAL) 400 MG CR TABLET    TAKE 1 TABLET THREE TIMES A DAY   SELENIUM 200 MCG TABS    Take 200 mcg by mouth  daily.   TADALAFIL (CIALIS) 5 MG TABLET    Take 1 tablet (5 mg total) by mouth daily as needed for erectile dysfunction.   VITAMIN E PO    Take 1 capsule by mouth daily.    Review of Systems  Constitutional: Negative.   HENT: Negative.   Eyes: Negative.   Respiratory: Negative.   Cardiovascular: Negative.   Gastrointestinal: Negative.   Endocrine: Negative.   Genitourinary: Negative.   Musculoskeletal: Positive for back pain.  Skin: Negative.   Allergic/Immunologic: Negative.   Neurological: Negative.   Hematological: Negative.   Psychiatric/Behavioral: Negative.     Social History  Substance Use Topics  . Smoking status: Former Smoker    Quit date: 10/19/1977  . Smokeless tobacco: Not on file  . Alcohol Use: 0.0 oz/week    0 Standard drinks or equivalent per week     Comment: very seldom   Objective:   BP 130/60 mmHg  Pulse 62  Temp(Src) 97.7 F (36.5 C) (Oral)  Resp 14  Wt 166 lb 3.2 oz (75.388 kg)  Physical Exam  Constitutional: He appears well-developed and well-nourished.  HENT:  Head: Normocephalic and atraumatic.  Right Ear: External ear normal.  Left Ear: External ear normal.  Nose: Nose normal.  Eyes: Conjunctivae are normal.  Neck: Neck supple. No thyromegaly present.  Cardiovascular: Normal rate, regular rhythm, normal heart sounds and intact distal pulses.   Pulmonary/Chest: Effort normal and breath sounds normal.  Abdominal: Soft.  Lymphadenopathy:    He has no cervical adenopathy.  Neurological: He is alert.  Skin: Skin is warm and dry.  Psychiatric: He has a normal mood and affect. His behavior is normal. Judgment and thought content normal.        Assessment & Plan:     1. Low back pain with right-sided sciatica, unspecified back pain laterality  - oxyCODONE (ROXICODONE) 15 MG immediate release tablet; Take 1 tablet (15 mg total) by mouth every 8 (eight) hours as needed for pain.  Dispense: 90 tablet; Refill: 0  2. Osteoarthritis of  knee, unspecified laterality, unspecified osteoarthritis type  - Ambulatory referral to Orthopedic Surgery I have done the exam and reviewed the above chart and it is accurate to the best of my knowledge.   Follow up: No Follow-up on file.

## 2015-10-30 ENCOUNTER — Telehealth: Payer: Self-pay | Admitting: Family Medicine

## 2015-10-30 DIAGNOSIS — J0100 Acute maxillary sinusitis, unspecified: Secondary | ICD-10-CM

## 2015-10-30 MED ORDER — AMOXICILLIN-POT CLAVULANATE 875-125 MG PO TABS: 1.0000 | ORAL_TABLET | Freq: Two times a day (BID) | ORAL | Status: DC

## 2015-10-30 NOTE — Telephone Encounter (Signed)
Please notify patient refill sent to [facility]. Thanks!

## 2015-10-30 NOTE — Telephone Encounter (Signed)
Pt called in saying he has been taking amoxil 875.  He said you told him to call back if he thinks he needs another round and he does think he does need more.  He uses [facility] .  His call back [phone]  [facility]

## 2015-10-30 NOTE — Telephone Encounter (Signed)
Patient advised as directed below.  Thanks,  -Ruhaan Nordahl 
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
## 2015-11-15 DIAGNOSIS — G8929 Other chronic pain: Secondary | ICD-10-CM | POA: Diagnosis not present

## 2015-11-15 DIAGNOSIS — M25561 Pain in right knee: Secondary | ICD-10-CM | POA: Diagnosis not present

## 2015-11-15 DIAGNOSIS — M1990 Unspecified osteoarthritis, unspecified site: Secondary | ICD-10-CM | POA: Diagnosis not present

## 2015-11-15 DIAGNOSIS — M25562 Pain in left knee: Secondary | ICD-10-CM | POA: Diagnosis not present

## 2016-01-10 ENCOUNTER — Ambulatory Visit (INDEPENDENT_AMBULATORY_CARE_PROVIDER_SITE_OTHER): Payer: Medicare Other | Admitting: Family Medicine

## 2016-01-10 ENCOUNTER — Encounter: Payer: Self-pay | Admitting: Family Medicine

## 2016-01-10 VITALS — BP 118/42 | HR 58 | Temp 98.7°F | Resp 12 | Wt 166.0 lb

## 2016-01-10 DIAGNOSIS — M179 Osteoarthritis of knee, unspecified: Secondary | ICD-10-CM

## 2016-01-10 DIAGNOSIS — I10 Essential (primary) hypertension: Secondary | ICD-10-CM | POA: Diagnosis not present

## 2016-01-10 DIAGNOSIS — M1710 Unilateral primary osteoarthritis, unspecified knee: Secondary | ICD-10-CM

## 2016-01-10 DIAGNOSIS — M5441 Lumbago with sciatica, right side: Secondary | ICD-10-CM

## 2016-01-10 DIAGNOSIS — G8929 Other chronic pain: Secondary | ICD-10-CM

## 2016-01-10 MED ORDER — METOPROLOL SUCCINATE ER 25 MG PO TB24: 25.0000 mg | ORAL_TABLET | Freq: Every day | ORAL | Status: DC

## 2016-01-10 MED ORDER — OXYCODONE HCL 15 MG PO TABS: 15.0000 mg | ORAL_TABLET | Freq: Three times a day (TID) | ORAL | Status: DC | PRN

## 2016-01-10 NOTE — Progress Notes (Signed)
Patient ID: Daniel Owens, male   DOB: 06-Mar-1946, 70 y.o.   MRN: [id number]    Subjective:  HPI  Patient is here for follow up.  Chronic pain: Symptoms are stable. He is having some issues with his left knee and down and is going to discuss this with a specialist. He also is concerned with some skin discoloration on lower part of left leg and make sure that and pain he is having not related.  Prior to Admission medications   Medication Sig Start Date End Date Taking? Authorizing Provider  Ascorbic Acid (VITAMIN C) 1000 MG tablet Take 1,000 mg by mouth daily.   Yes Historical Provider, MD  B Complex Vitamins (VITAMIN B COMPLEX PO) Take 1 tablet by mouth daily.   Yes Historical Provider, MD  beta carotene 25000 UNIT capsule Take 25,000 Units by mouth daily.   Yes Historical Provider, MD  calcium carbonate (OS-CAL) 600 MG TABS tablet Take 600 mg by mouth daily with breakfast.   Yes Historical Provider, MD  celecoxib (CELEBREX) 200 MG capsule Take 200 mg by mouth daily.   Yes Historical Provider, MD  cholecalciferol (VITAMIN D) 1000 UNITS tablet Take 1,000 Units by mouth daily.   Yes Historical Provider, MD  cyclobenzaprine (FLEXERIL) 10 MG tablet Take by mouth. Reported on 10/24/2015 08/26/12  Yes Historical Provider, MD  fish oil-omega-3 fatty acids 1000 MG capsule Take 1 g by mouth daily.   Yes Historical Provider, MD  GLUCOSAMINE-CHONDROIT-VIT C-MN PO Take by mouth. 01/16/15  Yes Historical Provider, MD  metoprolol succinate (TOPROL-XL) 50 MG 24 hr tablet Take 1 tablet (50 mg total) by mouth daily. Take with or immediately following a meal. 06/05/15  Yes Ruweyda Macknight Maceo Pro., MD  Multiple Vitamin (MULTIVITAMIN WITH MINERALS) TABS tablet Take 1 tablet by mouth daily.   Yes Historical Provider, MD  omeprazole (PRILOSEC) 20 MG capsule Take by mouth. 12/17/12  Yes Historical Provider, MD  oxyCODONE (ROXICODONE) 15 MG immediate release tablet Take 1 tablet (15 mg total) by mouth every 8 (eight) hours as  needed for pain. 10/24/15  Yes Reyaansh Merlo Maceo Pro., MD  pentoxifylline (TRENTAL) 400 MG CR tablet TAKE 1 TABLET THREE TIMES A DAY 09/03/15  Yes Jerrol Banana., MD  Selenium 200 MCG TABS Take 200 mcg by mouth daily.   Yes Historical Provider, MD  tadalafil (CIALIS) 5 MG tablet Take 1 tablet (5 mg total) by mouth daily as needed for erectile dysfunction. 06/05/15  Yes Chastelyn Athens Maceo Pro., MD  VITAMIN E PO Take 1 capsule by mouth daily.   Yes Historical Provider, MD    Patient Active Problem List   Diagnosis Date Noted  . Esophageal reflux 03/18/2015  . Arthritis 03/18/2015  . Back pain, chronic 03/18/2015  . Cardiac conduction disorder 03/18/2015  . Narrowing of intervertebral disc space 03/18/2015  . Essential (primary) hypertension 03/18/2015  . History of anemia 03/18/2015  . History of atrial fibrillation 03/18/2015  . Weak pulse 03/18/2015  . Awareness of heartbeats 03/18/2015  . Peyronie's disease 03/18/2015  . Raynaud's syndrome 03/18/2015  . Complete rotator cuff rupture of left shoulder 05/12/2013    No past medical history on file.  Social History   Social History  . Marital Status: Married    Spouse Name: N/A  . Number of Children: N/A  . Years of Education: N/A   Occupational History  . Not on file.   Social History Main Topics  . Smoking status: Former Smoker  Quit date: 10/19/1977  . Smokeless tobacco: Never Used  . Alcohol Use: 0.0 oz/week    0 Standard drinks or equivalent per week     Comment: very seldom  . Drug Use: No  . Sexual Activity: Not on file   Other Topics Concern  . Not on file   Social History Narrative    No Known Allergies  Review of Systems  Constitutional: Negative.   Eyes: Negative.   Respiratory: Negative.   Cardiovascular: Negative.   Gastrointestinal: Negative.   Musculoskeletal: Positive for myalgias, back pain and joint pain.  Skin: Negative.        Skin discoloration  Psychiatric/Behavioral: Negative.      Immunization History  Administered Date(s) Administered  . Influenza, High Dose Seasonal PF 07/30/2015  . Pneumococcal Conjugate-13 08/08/2014  . Pneumococcal Polysaccharide-23 11/15/2012  . Zoster 04/09/2010   Objective:  BP 108/46 mmHg  Pulse 58  Temp(Src) 98.7 F (37.1 C)  Resp 12  Wt 166 lb (75.297 kg)  Physical Exam  Constitutional: He is oriented to person, place, and time and well-developed, well-nourished, and in no distress.  HENT:  Head: Normocephalic and atraumatic.  Right Ear: External ear normal.  Left Ear: External ear normal.  Nose: Nose normal.  Eyes: Conjunctivae are normal. Pupils are equal, round, and reactive to light.  Neck: Normal range of motion. Neck supple.  Cardiovascular: Normal rate, regular rhythm, normal heart sounds and intact distal pulses.   No murmur heard. Pulmonary/Chest: Effort normal and breath sounds normal. No respiratory distress. He has no wheezes.  Abdominal: Soft.  Neurological: He is alert and oriented to person, place, and time.  Skin: Skin is warm and dry. Rash noted.  Venous stasis changes of right lower extremity just above the ankle.  Psychiatric: Mood, memory, affect and judgment normal.    Lab Results  Component Value Date   WBC 4.9 07/30/2015   HGB 13.6 01/16/2015   HCT 37.6 07/30/2015   PLT 179 07/30/2015   GLUCOSE 85 06/05/2015   CHOL 159 01/16/2015   TRIG 108 01/16/2015   HDL 77* 01/16/2015   LDLCALC 60 01/16/2015   TSH 1.030 06/05/2015   PSA 0.9 05/16/2014    CMP     Component Value Date/Time   NA 139 06/05/2015 1140   K 4.7 06/05/2015 1140   CL 98 06/05/2015 1140   CO2 22 06/05/2015 1140   GLUCOSE 85 06/05/2015 1140   BUN 24 06/05/2015 1140   CREATININE 0.92 06/05/2015 1140   CREATININE 0.9 01/16/2015   CALCIUM 9.2 06/05/2015 1140   PROT 6.5 06/05/2015 1140   ALBUMIN 4.4 06/05/2015 1140   AST 20 06/05/2015 1140   ALT 14 06/05/2015 1140   ALKPHOS 52 06/05/2015 1140   BILITOT 0.3 06/05/2015  1140   GFRNONAA 86 06/05/2015 1140   GFRAA 99 06/05/2015 1140    Assessment and Plan :  1. Chronic pain Stable.  2. Osteoarthritis of knee, unspecified laterality, unspecified osteoarthritis type  3. Essential hypertension B/P on the lower normal end, will decrease Metoprolol to 25 mg and re check in 3 months or sooner if needed,. 4. Low back pain with right-sided sciatica, unspecified back pain laterality Refills x 3 done-aa - oxyCODONE (ROXICODONE) 15 MG immediate release tablet; Take 1 tablet (15 mg total) by mouth every 8 (eight) hours as needed for pain.  Dispense: 90 tablet; Refill: 0 - oxyCODONE (ROXICODONE) 15 MG immediate release tablet; Take 1 tablet (15 mg total) by mouth every 8 (  eight) hours as needed for pain.  Dispense: 90 tablet; Refill: 0 I have done the exam and reviewed the above chart and it is accurate to the best of my knowledge.  Patient was seen and examined by Dr. Eulas Post and note was scribed by Theressa Millard, RMA.  Miguel Aschoff MD [facility] 01/10/2016 3:10 PM

## 2016-01-15 DIAGNOSIS — M5416 Radiculopathy, lumbar region: Secondary | ICD-10-CM | POA: Diagnosis not present

## 2016-01-15 DIAGNOSIS — M5417 Radiculopathy, lumbosacral region: Secondary | ICD-10-CM | POA: Diagnosis not present

## 2016-02-14 ENCOUNTER — Other Ambulatory Visit: Payer: Self-pay | Admitting: Neurological Surgery

## 2016-02-14 DIAGNOSIS — M47816 Spondylosis without myelopathy or radiculopathy, lumbar region: Secondary | ICD-10-CM | POA: Insufficient documentation

## 2016-02-14 DIAGNOSIS — M5416 Radiculopathy, lumbar region: Secondary | ICD-10-CM | POA: Diagnosis not present

## 2016-02-14 DIAGNOSIS — I10 Essential (primary) hypertension: Secondary | ICD-10-CM | POA: Diagnosis not present

## 2016-02-25 DIAGNOSIS — M2061 Acquired deformities of toe(s), unspecified, right foot: Secondary | ICD-10-CM | POA: Diagnosis not present

## 2016-02-25 DIAGNOSIS — M79674 Pain in right toe(s): Secondary | ICD-10-CM | POA: Diagnosis not present

## 2016-02-25 DIAGNOSIS — M898X9 Other specified disorders of bone, unspecified site: Secondary | ICD-10-CM | POA: Diagnosis not present

## 2016-03-04 ENCOUNTER — Ambulatory Visit
Admission: RE | Admit: 2016-03-04 | Discharge: 2016-03-04 | Disposition: A | Discharge status: Home / Self Care | Payer: Medicare Other | Source: Ambulatory Visit | Attending: Neurological Surgery | Admitting: Neurological Surgery

## 2016-03-04 DIAGNOSIS — M5416 Radiculopathy, lumbar region: Secondary | ICD-10-CM | POA: Insufficient documentation

## 2016-03-04 DIAGNOSIS — M419 Scoliosis, unspecified: Secondary | ICD-10-CM | POA: Insufficient documentation

## 2016-03-04 DIAGNOSIS — M5136 Other intervertebral disc degeneration, lumbar region: Secondary | ICD-10-CM | POA: Insufficient documentation

## 2016-03-04 DIAGNOSIS — M4806 Spinal stenosis, lumbar region: Secondary | ICD-10-CM | POA: Diagnosis not present

## 2016-03-06 DIAGNOSIS — I10 Essential (primary) hypertension: Secondary | ICD-10-CM | POA: Diagnosis not present

## 2016-03-06 DIAGNOSIS — M5416 Radiculopathy, lumbar region: Secondary | ICD-10-CM | POA: Diagnosis not present

## 2016-03-24 ENCOUNTER — Ambulatory Visit (INDEPENDENT_AMBULATORY_CARE_PROVIDER_SITE_OTHER): Payer: Medicare Other | Admitting: Family Medicine

## 2016-03-24 VITALS — BP 128/50 | HR 64 | Temp 97.8°F | Resp 16 | Wt 164.0 lb

## 2016-03-24 DIAGNOSIS — L708 Other acne: Secondary | ICD-10-CM

## 2016-03-24 DIAGNOSIS — M5441 Lumbago with sciatica, right side: Secondary | ICD-10-CM

## 2016-03-24 MED ORDER — OXYCODONE HCL 15 MG PO TABS: 15.0000 mg | ORAL_TABLET | Freq: Three times a day (TID) | ORAL | Status: DC | PRN

## 2016-03-24 MED ORDER — OXYCODONE HCL 15 MG PO TABS: 15.0000 mg | ORAL_TABLET | ORAL | Status: DC | PRN

## 2016-03-24 MED ORDER — AMOXICILLIN 500 MG PO CAPS
500.0000 mg | ORAL_CAPSULE | Freq: Three times a day (TID) | ORAL | Status: DC
Start: 2016-03-24 — End: 2016-06-10

## 2016-03-24 NOTE — Progress Notes (Signed)
Patient ID: Daniel Owens, male   DOB: [birth year], 70 y.o.   MRN: [id number]   NESTER WIENCEK  MRN: [id number] DOB: 1946-03-13  Subjective:  HPI   The patient has had a lump under his nose for about 2 weeks.  He noticed that 2 days ago it looked like it had some pus.  He used a pin to poke it open and said that it did drain a little.  He is most worried about  The knot.  Patient Active Problem List   Diagnosis Date Noted  . Esophageal reflux 03/18/2015  . Arthritis 03/18/2015  . Back pain, chronic 03/18/2015  . Cardiac conduction disorder 03/18/2015  . Narrowing of intervertebral disc space 03/18/2015  . Essential (primary) hypertension 03/18/2015  . History of anemia 03/18/2015  . History of atrial fibrillation 03/18/2015  . Weak pulse 03/18/2015  . Awareness of heartbeats 03/18/2015  . Peyronie's disease 03/18/2015  . Raynaud's syndrome 03/18/2015  . Complete rotator cuff rupture of left shoulder 05/12/2013    No past medical history on file.  Social History   Social History  . Marital Status: Married    Spouse Name: N/A  . Number of Children: N/A  . Years of Education: N/A   Occupational History  . Not on file.   Social History Main Topics  . Smoking status: Former Smoker    Quit date: 10/19/1977  . Smokeless tobacco: Never Used  . Alcohol Use: 0.0 oz/week    0 Standard drinks or equivalent per week     Comment: very seldom  . Drug Use: No  . Sexual Activity: Not on file   Other Topics Concern  . Not on file   Social History Narrative    Outpatient Prescriptions Prior to Visit  Medication Sig Dispense Refill  . Ascorbic Acid (VITAMIN C) 1000 MG tablet Take 1,000 mg by mouth daily.    . B Complex Vitamins (VITAMIN B COMPLEX PO) Take 1 tablet by mouth daily.    . beta carotene 25000 UNIT capsule Take 25,000 Units by mouth daily.    . calcium carbonate (OS-CAL) 600 MG TABS tablet Take 600 mg by mouth daily with breakfast.    . celecoxib (CELEBREX) 200 MG  capsule Take 200 mg by mouth daily.    . cholecalciferol (VITAMIN D) 1000 UNITS tablet Take 1,000 Units by mouth daily.    . cyclobenzaprine (FLEXERIL) 10 MG tablet Take by mouth. Reported on 10/24/2015    . fish oil-omega-3 fatty acids 1000 MG capsule Take 1 g by mouth daily.    Marland Kitchen GLUCOSAMINE-CHONDROIT-VIT C-MN PO Take by mouth.    . metoprolol succinate (TOPROL-XL) 25 MG 24 hr tablet Take 1 tablet (25 mg total) by mouth daily. Take with or immediately following a meal. 90 tablet 3  . Multiple Vitamin (MULTIVITAMIN WITH MINERALS) TABS tablet Take 1 tablet by mouth daily.    Marland Kitchen omeprazole (PRILOSEC) 20 MG capsule Take by mouth.    . oxyCODONE (ROXICODONE) 15 MG immediate release tablet Take 1 tablet (15 mg total) by mouth every 8 (eight) hours as needed for pain. 90 tablet 0  . oxyCODONE (ROXICODONE) 15 MG immediate release tablet Take 1 tablet (15 mg total) by mouth every 8 (eight) hours as needed for pain. 90 tablet 0  . Selenium 200 MCG TABS Take 200 mcg by mouth daily.    . tadalafil (CIALIS) 5 MG tablet Take 1 tablet (5 mg total) by mouth daily as needed for erectile dysfunction.  30 tablet 12  . VITAMIN E PO Take 1 capsule by mouth daily.    . pentoxifylline (TRENTAL) 400 MG CR tablet TAKE 1 TABLET THREE TIMES A DAY 270 tablet 3   No facility-administered medications prior to visit.    No Known Allergies  Review of Systems  Constitutional: Negative for fever and malaise/fatigue.  Respiratory: Negative for cough, shortness of breath and wheezing.   Cardiovascular: Positive for leg swelling (ankles). Negative for chest pain, palpitations, orthopnea and claudication.  Neurological: Negative for dizziness, weakness and headaches.   Objective:  BP 128/50 mmHg  Pulse 64  Temp(Src) 97.8 F (36.6 C) (Oral)  Resp 16  Wt 164 lb (74.39 kg)  Physical Exam  Constitutional: He is well-developed, well-nourished, and in no distress.  HENT:  Head: Normocephalic and atraumatic.  Right Ear:  External ear normal.  Left Ear: External ear normal.  Nose: Nose normal.  Eyes: Conjunctivae are normal.  Neck: Neck supple.  Cardiovascular: Normal rate and regular rhythm.   Pulmonary/Chest: Effort normal.  Abdominal: Soft.  Skin: Skin is warm and dry.  Psychiatric: Mood, memory, affect and judgment normal.    Assessment and Plan :   1. Low back pain with right-sided sciatica, unspecified back pain laterality - oxyCODONE (ROXICODONE) 15 MG immediate release tablet; Take 1 tablet (15 mg total) by mouth every 8 (eight) hours as needed for pain.  Dispense: 90 tablet; Refill: 0 - oxyCODONE (ROXICODONE) 15 MG immediate release tablet; Take 1 tablet (15 mg total) by mouth every 8 (eight) hours as needed for pain.  Dispense: 90 tablet; Refill: 0 - oxyCODONE (ROXICODONE) 15 MG immediate release tablet; Take 1 tablet (15 mg total) by mouth every 4 (four) hours as needed for pain.  Dispense: 30 tablet; Refill: 0  2. Follicular acne Hair follicle left nares  mildly infected. - amoxicillin (AMOXIL) 500 MG capsule; Take 1 capsule (500 mg total) by mouth 3 (three) times daily.  Dispense: 30 capsule; Refill: Chelan MD [facility] 03/24/2016 3:54 PM

## 2016-04-17 ENCOUNTER — Ambulatory Visit: Payer: Medicare Other | Admitting: Family Medicine

## 2016-05-16 ENCOUNTER — Other Ambulatory Visit: Payer: Self-pay | Admitting: Family Medicine

## 2016-06-10 ENCOUNTER — Encounter: Payer: Self-pay | Admitting: Family Medicine

## 2016-06-10 ENCOUNTER — Ambulatory Visit (INDEPENDENT_AMBULATORY_CARE_PROVIDER_SITE_OTHER): Payer: Medicare Other | Admitting: Family Medicine

## 2016-06-10 VITALS — BP 104/62 | HR 58 | Temp 98.3°F | Resp 16 | Wt 163.0 lb

## 2016-06-10 DIAGNOSIS — I10 Essential (primary) hypertension: Secondary | ICD-10-CM

## 2016-06-10 DIAGNOSIS — Z0000 Encounter for general adult medical examination without abnormal findings: Secondary | ICD-10-CM

## 2016-06-10 DIAGNOSIS — M5441 Lumbago with sciatica, right side: Secondary | ICD-10-CM

## 2016-06-10 DIAGNOSIS — N400 Enlarged prostate without lower urinary tract symptoms: Secondary | ICD-10-CM | POA: Diagnosis not present

## 2016-06-10 MED ORDER — OXYCODONE HCL 15 MG PO TABS: 15.0000 mg | ORAL_TABLET | Freq: Three times a day (TID) | ORAL | 0 refills | Status: DC | PRN

## 2016-06-10 MED ORDER — OXYCODONE HCL 15 MG PO TABS
15.0000 mg | ORAL_TABLET | Freq: Three times a day (TID) | ORAL | 0 refills | Status: DC | PRN
Start: 2016-06-10 — End: 2016-06-10

## 2016-06-10 NOTE — Progress Notes (Signed)
Patient: Daniel Owens, Male    DOB: 30-May-1946, 70 y.o.   MRN: [id number] Visit Date: 06/10/2016  Today's Provider: [name], MD   Chief Complaint  Patient presents with  . Annual Wellness Exam  . Leg Pain    left leg    Subjective:    Annual wellness visit Daniel Owens is a 70 y.o. male. He feels fairly well. He reports exercising not regularly, but he is active. He reports he is sleeping poorly due to shoulder pain and lower back pain.   Colonoscopy- 12/24/2012. Gastritis, otherwise normal.   Patient also wants to discuss the pain he has in his lower left leg. He reports that he has had this issue before in the past, but feels that the numbness has worsened.   Review of Systems  Constitutional: Negative.   HENT: Negative.   Eyes: Negative.   Respiratory: Negative.   Cardiovascular: Negative.   Gastrointestinal: Negative.   Endocrine: Negative.   Genitourinary: Positive for frequency. Negative for decreased urine volume, difficulty urinating, discharge, dysuria, enuresis, flank pain, genital sores, hematuria, penile pain, penile swelling, scrotal swelling, testicular pain and urgency.  Musculoskeletal: Positive for arthralgias, back pain, myalgias, neck pain and neck stiffness. Negative for gait problem and joint swelling.  Skin: Negative.   Allergic/Immunologic: Negative.   Neurological: Negative.   Hematological: Negative.   Psychiatric/Behavioral: Negative.     Social History   Social History  . Marital status: Married    Spouse name: N/A  . Number of children: N/A  . Years of education: N/A   Occupational History  . Not on file.   Social History Main Topics  . Smoking status: Former Smoker    Quit date: 10/19/1977  . Smokeless tobacco: Never Used  . Alcohol use 0.0 oz/week     Comment: very seldom  . Drug use: No  . Sexual activity: Not on file   Other Topics Concern  . Not on file   Social History Narrative  . No narrative on file      No past medical history on file.   Patient Active Problem List   Diagnosis Date Noted  . Esophageal reflux 03/18/2015  . Arthritis 03/18/2015  . Back pain, chronic 03/18/2015  . Cardiac conduction disorder 03/18/2015  . Narrowing of intervertebral disc space 03/18/2015  . Essential (primary) hypertension 03/18/2015  . History of anemia 03/18/2015  . History of atrial fibrillation 03/18/2015  . Weak pulse 03/18/2015  . Awareness of heartbeats 03/18/2015  . Peyronie's disease 03/18/2015  . Raynaud's syndrome 03/18/2015  . Complete rotator cuff rupture of left shoulder 05/12/2013    Past Surgical History:  Procedure Laterality Date  . APPENDECTOMY    . BILATERAL KNEE ARTHROSCOPY    . HERNIA REPAIR    . NECK SURGERY     involving disc  . SHOULDER SURGERY     Right-x 3 overall  . TENDON REPAIR     involving Tricep tendon    His family history includes Allergic rhinitis in his brother; Prostate cancer in his maternal uncle and paternal uncle; Stroke in his mother; Throat cancer in his father; Uterine cancer in his mother.    Current Meds  Medication Sig  . Ascorbic Acid (VITAMIN C) 1000 MG tablet Take 1,000 mg by mouth daily.  . B Complex Vitamins (VITAMIN B COMPLEX PO) Take 1 tablet by mouth daily.  . beta carotene 25000 UNIT capsule Take 25,000 Units by mouth  daily.  . calcium carbonate (OS-CAL) 600 MG TABS tablet Take 600 mg by mouth daily with breakfast.  . celecoxib (CELEBREX) 200 MG capsule TAKE 1 CAPSULE BY MOUTH EVERY DAY  . cholecalciferol (VITAMIN D) 1000 UNITS tablet Take 1,000 Units by mouth daily.  . cyclobenzaprine (FLEXERIL) 10 MG tablet Take by mouth. Reported on 10/24/2015  . fish oil-omega-3 fatty acids 1000 MG capsule Take 1 g by mouth daily.  Marland Kitchen GLUCOSAMINE-CHONDROIT-VIT C-MN PO Take by mouth.  . metoprolol succinate (TOPROL-XL) 25 MG 24 hr tablet Take 1 tablet (25 mg total) by mouth daily. Take with or immediately following a meal.  . Multiple Vitamin  (MULTIVITAMIN WITH MINERALS) TABS tablet Take 1 tablet by mouth daily.  Marland Kitchen omeprazole (PRILOSEC) 20 MG capsule Take by mouth.  . oxyCODONE (ROXICODONE) 15 MG immediate release tablet Take 1 tablet (15 mg total) by mouth every 8 (eight) hours as needed for pain.  . Selenium 200 MCG TABS Take 200 mcg by mouth daily.  . tadalafil (CIALIS) 5 MG tablet Take 1 tablet (5 mg total) by mouth daily as needed for erectile dysfunction.  Marland Kitchen VITAMIN E PO Take 1 capsule by mouth daily.    Patient Care Team: Jerrol Banana., MD as PCP - General (Family Medicine)    Objective:   Vitals: BP 104/62 (BP Location: Right Arm, Patient Position: Sitting, Cuff Size: Normal)   Pulse (!) 58   Temp 98.3 F (36.8 C)   Resp 16   Wt 163 lb (73.9 kg)   BMI 21.51 kg/m   Physical Exam  Constitutional: He is oriented to person, place, and time. He appears well-developed and well-nourished.  HENT:  Head: Normocephalic and atraumatic.  Right Ear: External ear normal.  Left Ear: External ear normal.  Nose: Nose normal.  Mouth/Throat: Oropharynx is clear and moist.  Eyes: Conjunctivae are normal. No scleral icterus.  Neck: No JVD present. No thyromegaly present.  Cardiovascular: Normal rate, regular rhythm, normal heart sounds and intact distal pulses.   Pulmonary/Chest: Effort normal and breath sounds normal.  Abdominal: Soft.  Musculoskeletal: He exhibits no edema or deformity.  He has chronic shoulder arthropathy right greater than left. Examination of lower extremity is today is normal.  Lymphadenopathy:    He has no cervical adenopathy.  Neurological: He is alert and oriented to person, place, and time. No cranial nerve deficit. He exhibits normal muscle tone.  Skin: Skin is warm and dry.  Psychiatric: He has a normal mood and affect. His behavior is normal. Judgment and thought content normal.    Activities of Daily Living In your present state of health, do you have any difficulty performing the  following activities: 06/10/2016  Hearing? N  Vision? N  Difficulty concentrating or making decisions? N  Walking or climbing stairs? N  Dressing or bathing? N  Doing errands, shopping? N  Some recent data might be hidden    Fall Risk Assessment Fall Risk  06/10/2016 06/05/2015  Falls in the past year? Yes No  Number falls in past yr: 1 -  Injury with Fall? No -     Depression Screen PHQ 2/9 Scores 06/10/2016 06/05/2015  PHQ - 2 Score 0 0    Cognitive Testing - 6-CIT  Correct? Score   What year is it? yes 0 0 or 4  What month is it? yes 0 0 or 3  Memorize:    Pia Mau,  [address]  [place],  What time is it? (within 1 hour) yes 0 0 or 3  Count backwards from 20 yes 0 0, 2, or 4  Name the months of the year yes 0 0, 2, or 4  Repeat name & address above yes 0 0, 2, 4, 6, 8, or 10       TOTAL SCORE  0/28   Interpretation:  Normal  Normal (0-7) Abnormal (8-28)       Assessment & Plan:     Annual Wellness Visit  Reviewed patient's Family Medical History Reviewed and updated list of patient's medical providers Assessment of cognitive impairment was done Assessed patient's functional ability Established a written schedule for health screening [place] Completed and Reviewed  Exercise Activities and Dietary recommendations Goals    None      Immunization History  Administered Date(s) Administered  . Influenza, High Dose Seasonal PF 07/30/2015  . Pneumococcal Conjugate-13 08/08/2014  . Pneumococcal Polysaccharide-23 11/15/2012  . Zoster 04/09/2010    Health Maintenance  Topic Date Due  . Hepatitis C Screening  [birth year]  . TETANUS/TDAP  07/03/1966  . INFLUENZA VACCINE  05/20/2016  . COLONOSCOPY  12/25/2022  . ZOSTAVAX  Completed  . PNA vac Low Risk Adult  Completed      Discussed health benefits of physical activity, and encouraged him to engage in regular exercise appropriate for his age and condition.     [name], MD  [facility]

## 2016-06-11 LAB — LIPID PANEL
Chol/HDL Ratio: 2.1 ratio units (ref 0.0–5.0)
Cholesterol, Total: 193 mg/dL (ref 100–199)
HDL: 91 mg/dL (ref >39)
LDL Calculated: 82 mg/dL (ref 0–99)
Triglycerides: 99 mg/dL (ref 0–149)
VLDL Cholesterol Cal: 20 mg/dL (ref 5–40)

## 2016-06-11 LAB — CBC WITH DIFFERENTIAL/PLATELET
Basophils Absolute: 0 10*3/uL (ref 0.0–0.2)
Basos: 0 %
EOS (ABSOLUTE): 0.1 10*3/uL (ref 0.0–0.4)
Eos: 1 %
Hematocrit: 39 % (ref 37.5–51.0)
Hemoglobin: 13.2 g/dL (ref 12.6–17.7)
Immature Grans (Abs): 0 10*3/uL (ref 0.0–0.1)
Immature Granulocytes: 0 %
Lymphocytes Absolute: 1.1 10*3/uL (ref 0.7–3.1)
Lymphs: 24 %
MCH: 34.6 pg — ABNORMAL HIGH (ref 26.6–33.0)
MCHC: 33.8 g/dL (ref 31.5–35.7)
MCV: 102 fL — ABNORMAL HIGH (ref 79–97)
Monocytes Absolute: 0.5 10*3/uL (ref 0.1–0.9)
Monocytes: 11 %
Neutrophils Absolute: 2.8 10*3/uL (ref 1.4–7.0)
Neutrophils: 64 %
Platelets: 192 10*3/uL (ref 150–379)
RBC: 3.81 x10E6/uL — ABNORMAL LOW (ref 4.14–5.80)
RDW: 13.2 % (ref 12.3–15.4)
WBC: 4.5 10*3/uL (ref 3.4–10.8)

## 2016-06-11 LAB — COMPREHENSIVE METABOLIC PANEL
ALT: 15 IU/L (ref 0–44)
AST: 19 IU/L (ref 0–40)
Albumin/Globulin Ratio: 2.1 (ref 1.2–2.2)
Albumin: 4.5 g/dL (ref 3.6–4.8)
Alkaline Phosphatase: 50 IU/L (ref 39–117)
BUN/Creatinine Ratio: 18 (ref 10–24)
BUN: 17 mg/dL (ref 8–27)
Bilirubin Total: 0.5 mg/dL (ref 0.0–1.2)
CO2: 26 mmol/L (ref 18–29)
Calcium: 9.4 mg/dL (ref 8.6–10.2)
Chloride: 99 mmol/L (ref 96–106)
Creatinine, Ser: 0.92 mg/dL (ref 0.76–1.27)
GFR calc Af Amer: 98 mL/min/{1.73_m2} (ref >59)
GFR calc non Af Amer: 85 mL/min/{1.73_m2} (ref >59)
Globulin, Total: 2.1 g/dL (ref 1.5–4.5)
Glucose: 65 mg/dL (ref 65–99)
Potassium: 4.7 mmol/L (ref 3.5–5.2)
Sodium: 139 mmol/L (ref 134–144)
Total Protein: 6.6 g/dL (ref 6.0–8.5)

## 2016-06-11 LAB — TSH: TSH: 1.1 u[IU]/mL (ref 0.450–4.500)

## 2016-06-11 LAB — PSA: Prostate Specific Ag, Serum: 1.2 ng/mL (ref 0.0–4.0)

## 2016-06-12 ENCOUNTER — Encounter: Payer: Self-pay | Admitting: Family Medicine

## 2016-06-12 ENCOUNTER — Ambulatory Visit (INDEPENDENT_AMBULATORY_CARE_PROVIDER_SITE_OTHER): Payer: Medicare Other | Admitting: Family Medicine

## 2016-06-12 VITALS — BP 106/70 | HR 54 | Temp 98.6°F | Resp 16 | Wt 165.0 lb

## 2016-06-12 DIAGNOSIS — Z23 Encounter for immunization: Secondary | ICD-10-CM

## 2016-06-12 DIAGNOSIS — S41131A Puncture wound without foreign body of right upper arm, initial encounter: Secondary | ICD-10-CM

## 2016-06-12 MED ORDER — AMOXICILLIN-POT CLAVULANATE 875-125 MG PO TABS: 1.0000 | ORAL_TABLET | Freq: Two times a day (BID) | ORAL | 0 refills | Status: DC

## 2016-06-12 NOTE — Progress Notes (Signed)
Patient: Daniel Owens Male    DOB: January 30, 1946   70 y.o.   MRN: [id number] Visit Date: 06/12/2016  Today's Provider: [name], MD   Chief Complaint  Patient presents with  . Wound Infection    possible   Subjective:    HPI Patient comes in today for a possible infection in his right arm. Patient reports that while cleaning his dentures, he was close to dropping them on the floor. He reports that when he went to catch them, the sharp edge on the end of his dentures scratched his right forearm. Patient has redness and swelling in the area where he was scratched. Patient wants to make sure that he does not have and infection. Patient denies any fever. He does mention that the area is hot and tender to touch. He has not used anything OTC for symptoms.     No Known Allergies Current Meds  Medication Sig  . Ascorbic Acid (VITAMIN C) 1000 MG tablet Take 1,000 mg by mouth daily.  . B Complex Vitamins (VITAMIN B COMPLEX PO) Take 1 tablet by mouth daily.  . beta carotene 25000 UNIT capsule Take 25,000 Units by mouth daily.  . calcium carbonate (OS-CAL) 600 MG TABS tablet Take 600 mg by mouth daily with breakfast.  . celecoxib (CELEBREX) 200 MG capsule TAKE 1 CAPSULE BY MOUTH EVERY DAY  . cholecalciferol (VITAMIN D) 1000 UNITS tablet Take 1,000 Units by mouth daily.  . cyclobenzaprine (FLEXERIL) 10 MG tablet Take by mouth. Reported on 10/24/2015  . fish oil-omega-3 fatty acids 1000 MG capsule Take 1 g by mouth daily.  Marland Kitchen GLUCOSAMINE-CHONDROIT-VIT C-MN PO Take by mouth.  . metoprolol succinate (TOPROL-XL) 25 MG 24 hr tablet Take 1 tablet (25 mg total) by mouth daily. Take with or immediately following a meal.  . Multiple Vitamin (MULTIVITAMIN WITH MINERALS) TABS tablet Take 1 tablet by mouth daily.  Marland Kitchen omeprazole (PRILOSEC) 20 MG capsule Take by mouth.  . oxyCODONE (ROXICODONE) 15 MG immediate release tablet Take 1 tablet (15 mg total) by mouth every 8 (eight) hours as needed for  pain.  . Selenium 200 MCG TABS Take 200 mcg by mouth daily.  . tadalafil (CIALIS) 5 MG tablet Take 1 tablet (5 mg total) by mouth daily as needed for erectile dysfunction.  Marland Kitchen VITAMIN E PO Take 1 capsule by mouth daily.    Review of Systems  Constitutional: Negative.   Respiratory: Negative.   Cardiovascular: Negative.   Musculoskeletal: Negative.   Skin: Positive for color change, rash and wound. Negative for pallor.  Allergic/Immunologic: Negative.   Psychiatric/Behavioral: Negative.     Social History  Substance Use Topics  . Smoking status: Former Smoker    Quit date: 10/19/1977  . Smokeless tobacco: Never Used  . Alcohol use 0.0 oz/week     Comment: very seldom   Objective:   BP 106/70 (BP Location: Right Arm, Patient Position: Sitting, Cuff Size: Normal)   Pulse (!) 54   Temp 98.6 F (37 C)   Resp 16   Wt 165 lb (74.8 kg)   BMI 21.77 kg/m   Physical Exam  Constitutional: He appears well-developed and well-nourished.  HENT:  Head: Normocephalic and atraumatic.  Eyes: Conjunctivae are normal. No scleral icterus.  Neck: No thyromegaly present.  Cardiovascular: Normal rate and regular rhythm.   Abdominal: Soft.  Lymphadenopathy:    He has no cervical adenopathy.  Skin: Skin is warm and dry. There is erythema.  Patient has erythema and swelling located on the anterior aspect of his right forearm. Patient also has a quarter sized papular nodule in the center that was the puncture wound that started the process. The area on the palmar surface of the right forearm is about 4inches by 7 inches with erythema, induration, and mild tenderness. No drainage or abscess formation  Psychiatric: He has a normal mood and affect. His behavior is normal. Judgment and thought content normal.        Assessment & Plan:     1. Puncture wound of right upper arm, initial encounter This must be puncture wound cane from part of his dentures. He came directly from his mouth. Cover with  Augmentin. We'll follow clinically. - Td : Tetanus/diphtheria >7yo Preservative  free        [name], MD  [facility]

## 2016-06-20 ENCOUNTER — Telehealth: Payer: Self-pay | Admitting: Family Medicine

## 2016-06-20 NOTE — Telephone Encounter (Signed)
Pt calling wanting to know test results from last week and pt also states he wants a copy of test results. Thanks CC

## 2016-06-20 NOTE — Telephone Encounter (Signed)
Advised patient of results. Copy left up front per patient request.

## 2016-06-26 ENCOUNTER — Telehealth: Payer: Self-pay | Admitting: Family Medicine

## 2016-06-26 NOTE — Telephone Encounter (Signed)
Spoke with patient and  looked at records. 03/24/16 we did print RX for Oxycodone but for 30 tablets and it has been 90 tablets this was error on [place] part. [place] it even though it was only 30 tablets do not having another RX with him. I advised patient to let us know if he has trouble filling his RX at sooner date with different directions. I am not sure ot the regulations with pharmacy but will try to help patient at that time since this was our error-aa

## 2016-06-26 NOTE — Telephone Encounter (Signed)
Pt said his oxycodone prescription that he got filled is only for 30 pills.  Which will last him only 10 days.    He is going out of town but will be back Monday.  He will need another RX for the rest of the pills when he.  He would like to speak to someone to find out why it was only wrote for 30 and not the 90 as usual.   Please call patient [phone]  Thanks, Con Memos

## 2016-06-26 NOTE — Telephone Encounter (Signed)
Please check and correct if necessary. Thank you. I do not think this patient would be dishonest about this.

## 2016-06-30 ENCOUNTER — Other Ambulatory Visit: Payer: Self-pay | Admitting: Family Medicine

## 2016-06-30 DIAGNOSIS — R351 Nocturia: Secondary | ICD-10-CM

## 2016-06-30 NOTE — Telephone Encounter (Signed)
Pt stated that he worked out getting the medication with the pharmacy but b/c one of the RX were written for 30 instead of 90 he would need to come in sooner for his f/u appt to get refills. Pt rescheduled appt for 09/24/16. Thanks TNP

## 2016-06-30 NOTE — Telephone Encounter (Signed)
Noted-aa 

## 2016-08-05 ENCOUNTER — Telehealth: Payer: Self-pay | Admitting: Family Medicine

## 2016-08-05 DIAGNOSIS — R351 Nocturia: Secondary | ICD-10-CM

## 2016-08-05 DIAGNOSIS — N401 Enlarged prostate with lower urinary tract symptoms: Secondary | ICD-10-CM | POA: Insufficient documentation

## 2016-08-05 NOTE — Telephone Encounter (Signed)
Pt called and wants to talk to [facility] regarding his celias.  His call back is [phone]  Thanks, Con Memos

## 2016-08-05 NOTE — Telephone Encounter (Signed)
Spoke with patient, he takes Cialis for BPH. Originally started by Dr. Gareth Eagle urology who is retiring, He has tried Flomax and Proscar in the past, we have urology notes in care everywhere section, working on [place] form-aa

## 2016-08-19 DIAGNOSIS — Z23 Encounter for immunization: Secondary | ICD-10-CM | POA: Diagnosis not present

## 2016-09-03 ENCOUNTER — Ambulatory Visit (INDEPENDENT_AMBULATORY_CARE_PROVIDER_SITE_OTHER): Payer: Medicare Other | Admitting: Physician Assistant

## 2016-09-03 ENCOUNTER — Encounter: Payer: Self-pay | Admitting: Physician Assistant

## 2016-09-03 VITALS — BP 106/52 | HR 72 | Temp 99.0°F | Resp 16 | Wt 168.0 lb

## 2016-09-03 DIAGNOSIS — R519 Headache, unspecified: Secondary | ICD-10-CM

## 2016-09-03 DIAGNOSIS — J101 Influenza due to other identified influenza virus with other respiratory manifestations: Secondary | ICD-10-CM

## 2016-09-03 DIAGNOSIS — R05 Cough: Secondary | ICD-10-CM | POA: Diagnosis not present

## 2016-09-03 DIAGNOSIS — J111 Influenza due to unidentified influenza virus with other respiratory manifestations: Secondary | ICD-10-CM

## 2016-09-03 DIAGNOSIS — R51 Headache: Secondary | ICD-10-CM | POA: Diagnosis not present

## 2016-09-03 DIAGNOSIS — R059 Cough, unspecified: Secondary | ICD-10-CM

## 2016-09-03 LAB — POCT INFLUENZA A/B
Influenza A, POC: POSITIVE — AB
Influenza B, POC: NEGATIVE

## 2016-09-03 MED ORDER — OSELTAMIVIR PHOSPHATE 75 MG PO CAPS: 75.0000 mg | ORAL_CAPSULE | Freq: Two times a day (BID) | ORAL | 0 refills | Status: AC

## 2016-09-03 NOTE — Patient Instructions (Signed)

## 2016-09-03 NOTE — Progress Notes (Signed)
[place]  Chief Complaint  Patient presents with  . URI    Started about two days ago.   . Sinusitis    Subjective:    Patient ID: Daniel Owens, male    DOB: Feb 02, 1946, 70 y.o.   MRN: [id number]  Upper Respiratory Infection: Daniel Owens is a 70 y.o. male with a past medical history significant for HTN complaining of symptoms of a URI. Symptoms include congestion, cough, plugged sensation in both ears and sore throat. Onset of symptoms was 2 days ago, gradually worsening since that time. He also c/o achiness, facial pain and headache described as Severe for the past 2 days .  He is drinking plenty of fluids. Evaluation to date: none. Treatment to date: Oxycodone for the headache. The treatment has provided no.   Review of Systems  Constitutional: Positive for appetite change, chills and fatigue. Negative for activity change, diaphoresis, fever and unexpected weight change.  HENT: Positive for congestion, postnasal drip, rhinorrhea, sinus pain, sinus pressure, sore throat and voice change. Negative for ear discharge, ear pain (Pt says his ears feel stopped. ), nosebleeds, sneezing, tinnitus and trouble swallowing.   Eyes: Positive for discharge and itching. Negative for photophobia, pain, redness and visual disturbance.  Respiratory: Positive for cough, chest tightness and wheezing. Negative for apnea, choking, shortness of breath and stridor.   Cardiovascular: Negative.   Gastrointestinal: Positive for nausea. Negative for abdominal distention, abdominal pain, anal bleeding, blood in stool, constipation, diarrhea, rectal pain and vomiting.  Musculoskeletal: Positive for myalgias.  Neurological: Positive for light-headedness and headaches (Pt reports he has a severe headache.). Negative for dizziness, tremors, seizures, syncope, facial asymmetry, speech difficulty, weakness and numbness.       Objective:   BP (!) 106/52 (BP Location: Right Arm,  Patient Position: Sitting, Cuff Size: Normal)   Pulse 72   Temp 99 F (37.2 C) (Oral)   Resp 16   Wt 168 lb (76.2 kg)   BMI 22.16 kg/m   Patient Active Problem List   Diagnosis Date Noted  . BPH associated with nocturia 08/05/2016  . Esophageal reflux 03/18/2015  . Arthritis 03/18/2015  . Back pain, chronic 03/18/2015  . Cardiac conduction disorder 03/18/2015  . Narrowing of intervertebral disc space 03/18/2015  . Essential (primary) hypertension 03/18/2015  . History of anemia 03/18/2015  . History of atrial fibrillation 03/18/2015  . Weak pulse 03/18/2015  . Awareness of heartbeats 03/18/2015  . Peyronie's disease 03/18/2015  . Raynaud's syndrome 03/18/2015  . Complete rotator cuff rupture of left shoulder 05/12/2013    Outpatient Encounter Prescriptions as of 09/03/2016  Medication Sig Note  . Ascorbic Acid (VITAMIN C) 1000 MG tablet Take 1,000 mg by mouth daily.   . B Complex Vitamins (VITAMIN B COMPLEX PO) Take 1 tablet by mouth daily.   . beta carotene 25000 UNIT capsule Take 25,000 Units by mouth daily.   . calcium carbonate (OS-CAL) 600 MG TABS tablet Take 600 mg by mouth daily with breakfast.   . celecoxib (CELEBREX) 200 MG capsule TAKE 1 CAPSULE BY MOUTH EVERY DAY   . cholecalciferol (VITAMIN D) 1000 UNITS tablet Take 1,000 Units by mouth daily.   Marland Kitchen CIALIS 5 MG tablet TAKE 1 TABLET BY MOUTH DAILY AS NEEDED FOR ERECTILE DYSFUNCTION.   . fish oil-omega-3 fatty acids 1000 MG capsule Take 1 g by mouth daily.   Marland Kitchen GLUCOSAMINE-CHONDROIT-VIT C-MN PO Take by mouth. 03/18/2015: OTC Received from: [facility]  .  Multiple Vitamin (MULTIVITAMIN WITH MINERALS) TABS tablet Take 1 tablet by mouth daily.   Marland Kitchen omeprazole (PRILOSEC) 20 MG capsule Take by mouth. 03/18/2015: Medication taken as needed.  Received from: [facility]  . oxyCODONE (ROXICODONE) 15 MG immediate release tablet Take 1 tablet (15 mg total) by mouth every 8 (eight) hours as needed  for pain.   . Selenium 200 MCG TABS Take 200 mcg by mouth daily.   Marland Kitchen VITAMIN E PO Take 1 capsule by mouth daily.   . cyclobenzaprine (FLEXERIL) 10 MG tablet Take by mouth. Reported on 10/24/2015 03/18/2015: Received from: [facility]  . metoprolol succinate (TOPROL-XL) 25 MG 24 hr tablet Take 1 tablet (25 mg total) by mouth daily. Take with or immediately following a meal.   . oseltamivir (TAMIFLU) 75 MG capsule Take 1 capsule (75 mg total) by mouth 2 (two) times daily.   . [DISCONTINUED] amoxicillin-clavulanate (AUGMENTIN) 875-125 MG tablet Take 1 tablet by mouth 2 (two) times daily.    No facility-administered encounter medications on file as of 09/03/2016.     No Known Allergies     Physical Exam  Constitutional: He is oriented to person, place, and time. He appears well-developed and well-nourished.  Non-toxic appearance. He does not have a sickly appearance. He does not appear ill. No distress.  HENT:  Right Ear: Tympanic membrane and external ear normal.  Left Ear: Tympanic membrane and external ear normal.  Nose: Rhinorrhea present. Right sinus exhibits no maxillary sinus tenderness and no frontal sinus tenderness. Left sinus exhibits no maxillary sinus tenderness and no frontal sinus tenderness.  Mouth/Throat: Uvula is midline, oropharynx is clear and moist and mucous membranes are normal. No oropharyngeal exudate.  Clear rhinorrhea  Eyes: Conjunctivae are normal. Right eye exhibits discharge. Left eye exhibits discharge.  Watery Discharge   Neck: Normal range of motion and full passive range of motion without pain. Neck supple. No neck rigidity. No Kernig's sign noted.  Cardiovascular: Normal rate and regular rhythm.   Pulmonary/Chest: Effort normal and breath sounds normal. No respiratory distress. He has no wheezes. He has no rales.  Lymphadenopathy:    He has no cervical adenopathy.  Neurological: He is alert and oriented to person, place, and time.  Skin:  Skin is warm and dry. He is not diaphoretic.  Psychiatric: He has a normal mood and affect. His behavior is normal.       Assessment & Plan:   Problem List Items Addressed This Visit    None    Visit Diagnoses    Cough    -  Primary   Relevant Orders   POCT Influenza A/B (Completed)   Acute nonintractable headache, unspecified headache type       Relevant Orders   POCT Influenza A/B (Completed)   Flu       Relevant Medications   oseltamivir (TAMIFLU) 75 MG capsule     Patient is 70 y/o male presenting with the flu. Rapid flu swab in office today positive for influenza A. Prescribed Tamiflu as above. Counseled on return precautions.   Recommend rest, fluids, frequent hand washing.  Return if symptoms worsen or fail to improve.   Patient Instructions   Influenza, Adult Influenza, more commonly known as "the flu," is a viral infection that primarily affects the respiratory tract. The respiratory tract includes organs that help you breathe, such as the lungs, nose, and throat. The flu causes many common cold symptoms, as well as a high fever and body  aches. The flu spreads easily from person to person (is contagious). Getting a flu shot (influenza vaccination) every year is the best way to prevent influenza. What are the causes? Influenza is caused by a virus. You can catch the virus by:  Breathing in droplets from an infected person's cough or sneeze.  Touching something that was recently contaminated with the virus and then touching your mouth, nose, or eyes. What increases the risk? The following factors may make you more likely to get the flu:  Not cleaning your hands frequently with soap and water or alcohol-based hand sanitizer.  Having close contact with many people during cold and flu season.  Touching your mouth, eyes, or nose without washing or sanitizing your hands first.  Not drinking enough fluids or not eating a healthy diet.  Not getting enough sleep or  exercise.  Being under a high amount of stress.  Not getting a yearly (annual) flu shot. You may be at a higher risk of complications from the flu, such as a severe lung infection (pneumonia), if you:  Are over the age of 42.  Are pregnant.  Have a weakened disease-fighting system (immune system). You may have a weakened immune system if you:  Have HIV or AIDS.  Are undergoing chemotherapy.  Aretaking medicines that reduce the activity of (suppress) the immune system.  Have a long-term (chronic) illness, such as heart disease, kidney disease, diabetes, or lung disease.  Have a liver disorder.  Are obese.  Have anemia. What are the signs or symptoms? Symptoms of this condition typically last 4-10 days and may include:  Fever.  Chills.  Headache, body aches, or muscle aches.  Sore throat.  Cough.  Runny or congested nose.  Chest discomfort and cough.  Poor appetite.  Weakness or tiredness (fatigue).  Dizziness.  Nausea or vomiting. How is this diagnosed? This condition may be diagnosed based on your medical history and a physical exam. Your health care provider may do a nose or throat swab test to confirm the diagnosis. How is this treated? If influenza is detected early, you can be treated with antiviral medicine that can reduce the length of your illness and the severity of your symptoms. This medicine may be given by mouth (orally) or through an IV tube that is inserted in one of your veins. The goal of treatment is to relieve symptoms by taking care of yourself at home. This may include taking over-the-counter medicines, drinking plenty of fluids, and adding humidity to the air in your home. In some cases, influenza goes away on its own. Severe influenza or complications from influenza may be treated in a hospital. Follow these instructions at home:  Take over-the-counter and prescription medicines only as told by your health care provider.  Use a cool  mist humidifier to add humidity to the air in your home. This can make breathing easier.  Rest as needed.  Drink enough fluid to keep your urine clear or pale yellow.  Cover your mouth and nose when you cough or sneeze.  Wash your hands with soap and water often, especially after you cough or sneeze. If soap and water are not available, use hand sanitizer.  Stay home from work or school as told by your health care provider. Unless you are visiting your health care provider, try to avoid leaving home until your fever has been gone for 24 hours without the use of medicine.  Keep all follow-up visits as told by your health care provider.  This is important. How is this prevented?  Getting an annual flu shot is the best way to avoid getting the flu. You may get the flu shot in late summer, fall, or winter. Ask your health care provider when you should get your flu shot.  Wash your hands often or use hand sanitizer often.  Avoid contact with people who are sick during cold and flu season.  Eat a healthy diet, drink plenty of fluids, get enough sleep, and exercise regularly. Contact a health care provider if:  You develop new symptoms.  You have:  Chest pain.  Diarrhea.  A fever.  Your cough gets worse.  You produce more mucus.  You feel nauseous or you vomit. Get help right away if:  You develop shortness of breath or difficulty breathing.  Your skin or nails turn a bluish color.  You have severe pain or stiffness in your neck.  You develop a sudden headache or sudden pain in your face or ear.  You cannot stop vomiting. This information is not intended to replace advice given to you by your health care provider. Make sure you discuss any questions you have with your health care provider. Document Released: 10/03/2000 Document Revised: 03/13/2016 Document Reviewed: 07/31/2015 Elsevier Interactive Patient Education  2017 [employer].     The entirety of the  information documented in the History of Present Illness, Review of Systems and Physical Exam were personally obtained by me. Portions of this information were initially documented by Ashley Royalty, CMA and reviewed by me for thoroughness and accuracy.

## 2016-09-24 ENCOUNTER — Ambulatory Visit (INDEPENDENT_AMBULATORY_CARE_PROVIDER_SITE_OTHER): Payer: Medicare Other | Admitting: Family Medicine

## 2016-09-24 VITALS — BP 124/54 | HR 64 | Temp 98.3°F | Resp 16 | Wt 168.0 lb

## 2016-09-24 DIAGNOSIS — G8929 Other chronic pain: Secondary | ICD-10-CM

## 2016-09-24 DIAGNOSIS — I10 Essential (primary) hypertension: Secondary | ICD-10-CM | POA: Diagnosis not present

## 2016-09-24 DIAGNOSIS — K219 Gastro-esophageal reflux disease without esophagitis: Secondary | ICD-10-CM | POA: Diagnosis not present

## 2016-09-24 DIAGNOSIS — M1710 Unilateral primary osteoarthritis, unspecified knee: Secondary | ICD-10-CM

## 2016-09-24 DIAGNOSIS — N401 Enlarged prostate with lower urinary tract symptoms: Secondary | ICD-10-CM | POA: Diagnosis not present

## 2016-09-24 DIAGNOSIS — R351 Nocturia: Secondary | ICD-10-CM | POA: Diagnosis not present

## 2016-09-24 DIAGNOSIS — M179 Osteoarthritis of knee, unspecified: Secondary | ICD-10-CM

## 2016-09-24 MED ORDER — OXYCODONE HCL 15 MG PO TABS: 15.0000 mg | ORAL_TABLET | Freq: Three times a day (TID) | ORAL | 0 refills | Status: DC | PRN

## 2016-09-24 NOTE — Progress Notes (Signed)
Daniel Owens  MRN: [id number] DOB: 06-29-1946  Subjective:  HPI  Patient is here for follow up. Patient needs refill on Oxycodone. Symptoms present are still back pain and right shoulder pain/stiffness. He is taking Celebrex also but not daily about 1 tablet every 3 days. When pain is severe on those days he takes Flexeril for muscle spasms but has to stay in bed due to how the medication makes him feel.  GERD: he has cut back on how much NSAIDs he takes so he is able to cut back on Omeprazole use and takes it 1 a week maybe. BPH: he takes Cialis for this not for ED. He is still getting up 4 times a night to urinate and is not sure maybe he needs to go back to urologist.  Routine labs were done on 06/10/16 and were stable except MCV was elevated again as it was last year but B12 level was normal in 2016. Patient does not drink alcohol.  Patient Active Problem List   Diagnosis Date Noted  . BPH associated with nocturia 08/05/2016  . Esophageal reflux 03/18/2015  . Arthritis 03/18/2015  . Back pain, chronic 03/18/2015  . Cardiac conduction disorder 03/18/2015  . Narrowing of intervertebral disc space 03/18/2015  . Essential (primary) hypertension 03/18/2015  . History of anemia 03/18/2015  . History of atrial fibrillation 03/18/2015  . Weak pulse 03/18/2015  . Awareness of heartbeats 03/18/2015  . Peyronie's disease 03/18/2015  . Raynaud's syndrome 03/18/2015  . Complete rotator cuff rupture of left shoulder 05/12/2013    No past medical history on file.  Social History   Social History  . Marital status: Married    Spouse name: N/A  . Number of children: N/A  . Years of education: N/A   Occupational History  . Not on file.   Social History Main Topics  . Smoking status: Former Smoker    Quit date: 10/19/1977  . Smokeless tobacco: Never Used  . Alcohol use 0.0 oz/week     Comment: very seldom  . Drug use: No  . Sexual activity: Not on file   Other Topics Concern    . Not on file   Social History Narrative  . No narrative on file    Outpatient Encounter Prescriptions as of 09/24/2016  Medication Sig Note  . Ascorbic Acid (VITAMIN C) 1000 MG tablet Take 1,000 mg by mouth daily.   . B Complex Vitamins (VITAMIN B COMPLEX PO) Take 1 tablet by mouth daily.   . beta carotene 25000 UNIT capsule Take 25,000 Units by mouth daily.   . calcium carbonate (OS-CAL) 600 MG TABS tablet Take 600 mg by mouth daily with breakfast.   . celecoxib (CELEBREX) 200 MG capsule TAKE 1 CAPSULE BY MOUTH EVERY DAY   . cholecalciferol (VITAMIN D) 1000 UNITS tablet Take 1,000 Units by mouth daily.   Marland Kitchen CIALIS 5 MG tablet TAKE 1 TABLET BY MOUTH DAILY AS NEEDED FOR ERECTILE DYSFUNCTION.   . cyclobenzaprine (FLEXERIL) 10 MG tablet Take by mouth. Reported on 10/24/2015 03/18/2015: Received from: [facility]  . fish oil-omega-3 fatty acids 1000 MG capsule Take 1 g by mouth daily.   Marland Kitchen GLUCOSAMINE-CHONDROIT-VIT C-MN PO Take by mouth. 03/18/2015: OTC Received from: [facility]  . metoprolol succinate (TOPROL-XL) 25 MG 24 hr tablet Take 1 tablet (25 mg total) by mouth daily. Take with or immediately following a meal.   . Multiple Vitamin (MULTIVITAMIN WITH MINERALS) TABS tablet Take 1 tablet by  mouth daily.   Marland Kitchen omeprazole (PRILOSEC) 20 MG capsule Take by mouth. 03/18/2015: Medication taken as needed.  Received from: [facility]  . oxyCODONE (ROXICODONE) 15 MG immediate release tablet Take 1 tablet (15 mg total) by mouth every 8 (eight) hours as needed for pain.   . Selenium 200 MCG TABS Take 200 mcg by mouth daily.   Marland Kitchen VITAMIN E PO Take 1 capsule by mouth daily.    No facility-administered encounter medications on file as of 09/24/2016.     No Known Allergies  Review of Systems  Constitutional: Positive for malaise/fatigue (better since having flu A).  HENT: Positive for congestion.   Respiratory: Positive for cough.   Cardiovascular:  Negative.   Gastrointestinal: Negative.   Genitourinary: Positive for urgency.       Nocturia  Musculoskeletal: Positive for back pain and joint pain.       Chronic  Neurological: Negative.   Psychiatric/Behavioral: Negative.     Objective:  BP (!) 124/54   Pulse 64   Temp 98.3 F (36.8 C)   Resp 16   Wt 168 lb (76.2 kg)   BMI 22.16 kg/m   Physical Exam  Constitutional: He is oriented to person, place, and time and well-developed, well-nourished, and in no distress.  HENT:  Head: Normocephalic and atraumatic.  Right Ear: External ear normal.  Left Ear: External ear normal.  Nose: Nose normal.  Eyes: Conjunctivae are normal. Pupils are equal, round, and reactive to light.  Neck: Normal range of motion. Neck supple.  Cardiovascular: Normal rate, regular rhythm, normal heart sounds and intact distal pulses.   No murmur heard. Pulmonary/Chest: Effort normal and breath sounds normal. No respiratory distress. He has no wheezes.  Abdominal: Soft.  Musculoskeletal: He exhibits no edema.  Neurological: He is alert and oriented to person, place, and time.  Skin: Skin is warm and dry.  Psychiatric: Mood, memory, affect and judgment normal.    Assessment and Plan :  1. Essential (primary) hypertension Stable.  2. Other chronic pain Refills given times 3.  3. Osteoarthritis of knee, unspecified laterality, unspecified osteoarthritis type  4. Gastroesophageal reflux disease, esophagitis presence not specified Stable.  5. BPH associated with nocturia Failed Tamsulosin. Failed Avodart by description.  Better on Cialis but is getting worse. Will go ahead and refer to urologist for further plan of care. 6.Recent Influenza A Patient had flu A on 09/04/16, discussed this with patient and how long it could take until he feels 100%.  HPI, Exam and A&P transcribed under direction and in the presence of Miguel Aschoff, MD. I have done the exam and reviewed the chart and it is  accurate to the best of my knowledge. Development worker, community has been used and  any errors in dictation or transcription are unintentional. Miguel Aschoff M.D. [facility]

## 2016-10-22 ENCOUNTER — Ambulatory Visit: Payer: Medicare Other | Admitting: Family Medicine

## 2016-11-19 DIAGNOSIS — M5010 Cervical disc disorder with radiculopathy, unspecified cervical region: Secondary | ICD-10-CM | POA: Diagnosis not present

## 2016-11-20 ENCOUNTER — Other Ambulatory Visit: Payer: Self-pay | Admitting: Neurological Surgery

## 2016-11-20 DIAGNOSIS — M5010 Cervical disc disorder with radiculopathy, unspecified cervical region: Secondary | ICD-10-CM

## 2016-12-02 ENCOUNTER — Ambulatory Visit
Admission: RE | Admit: 2016-12-02 | Discharge: 2016-12-02 | Disposition: A | Discharge status: Home / Self Care | Payer: Medicare Other | Source: Ambulatory Visit | Attending: Neurological Surgery | Admitting: Neurological Surgery

## 2016-12-02 DIAGNOSIS — R609 Edema, unspecified: Secondary | ICD-10-CM | POA: Insufficient documentation

## 2016-12-02 DIAGNOSIS — M5010 Cervical disc disorder with radiculopathy, unspecified cervical region: Secondary | ICD-10-CM | POA: Diagnosis not present

## 2016-12-02 DIAGNOSIS — M542 Cervicalgia: Secondary | ICD-10-CM | POA: Diagnosis not present

## 2016-12-02 DIAGNOSIS — M4802 Spinal stenosis, cervical region: Secondary | ICD-10-CM | POA: Insufficient documentation

## 2016-12-04 DIAGNOSIS — I10 Essential (primary) hypertension: Secondary | ICD-10-CM | POA: Diagnosis not present

## 2016-12-04 DIAGNOSIS — M5416 Radiculopathy, lumbar region: Secondary | ICD-10-CM | POA: Diagnosis not present

## 2016-12-04 DIAGNOSIS — M5010 Cervical disc disorder with radiculopathy, unspecified cervical region: Secondary | ICD-10-CM | POA: Diagnosis not present

## 2016-12-17 ENCOUNTER — Ambulatory Visit: Payer: Medicare Other | Admitting: Family Medicine

## 2016-12-18 ENCOUNTER — Ambulatory Visit: Payer: Medicare Other | Admitting: Family Medicine

## 2016-12-19 DIAGNOSIS — M5116 Intervertebral disc disorders with radiculopathy, lumbar region: Secondary | ICD-10-CM | POA: Diagnosis not present

## 2016-12-19 DIAGNOSIS — M5416 Radiculopathy, lumbar region: Secondary | ICD-10-CM | POA: Diagnosis not present

## 2016-12-22 ENCOUNTER — Ambulatory Visit (INDEPENDENT_AMBULATORY_CARE_PROVIDER_SITE_OTHER): Payer: Medicare Other | Admitting: Family Medicine

## 2016-12-22 VITALS — BP 146/62 | HR 66 | Temp 98.6°F | Resp 16 | Wt 165.0 lb

## 2016-12-22 DIAGNOSIS — K219 Gastro-esophageal reflux disease without esophagitis: Secondary | ICD-10-CM | POA: Diagnosis not present

## 2016-12-22 DIAGNOSIS — G8929 Other chronic pain: Secondary | ICD-10-CM

## 2016-12-22 DIAGNOSIS — N401 Enlarged prostate with lower urinary tract symptoms: Secondary | ICD-10-CM | POA: Diagnosis not present

## 2016-12-22 DIAGNOSIS — I10 Essential (primary) hypertension: Secondary | ICD-10-CM | POA: Diagnosis not present

## 2016-12-22 DIAGNOSIS — R351 Nocturia: Secondary | ICD-10-CM

## 2016-12-22 MED ORDER — METOPROLOL SUCCINATE ER 50 MG PO TB24
ORAL_TABLET | ORAL | 3 refills | Status: DC
Start: 2016-12-22 — End: 2017-09-03

## 2016-12-22 MED ORDER — OXYCODONE HCL 15 MG PO TABS: 15.0000 mg | ORAL_TABLET | Freq: Three times a day (TID) | ORAL | 0 refills | Status: DC | PRN

## 2016-12-22 NOTE — Progress Notes (Signed)
Daniel Owens  MRN: [id number] DOB: Oct 25, 1945  Subjective:  HPI  Patient is here for 3 months follow up. Needs refills on Oxycodone. He did have spinal injection on Friday 12/19/16 and is having some muscle cramps,jitterness and flushing sensation and it takes him 3 to 4 days to get over it and this happens every time he gets injection. He tries not to get these a lot because he knows it is not good to do that. Last labs were done in August 2017.  Patient has noticed his b/p readings have been getting elevated top number 150s-160s. He use to take Metoprolol 50 mg and then we cut back to 25 mg and wonders if he may need to increase it again. BP Readings from Last 3 Encounters:  12/22/16 (!) 146/62  09/24/16 (!) 124/54  09/03/16 (!) 106/52   Patient Active Problem List   Diagnosis Date Noted  . BPH associated with nocturia 08/05/2016  . Esophageal reflux 03/18/2015  . Arthritis 03/18/2015  . Back pain, chronic 03/18/2015  . Cardiac conduction disorder 03/18/2015  . Narrowing of intervertebral disc space 03/18/2015  . Essential (primary) hypertension 03/18/2015  . History of anemia 03/18/2015  . History of atrial fibrillation 03/18/2015  . Weak pulse 03/18/2015  . Awareness of heartbeats 03/18/2015  . Peyronie's disease 03/18/2015  . Raynaud's syndrome 03/18/2015  . Complete rotator cuff rupture of left shoulder 05/12/2013    No past medical history on file.  Social History   Social History  . Marital status: Married    Spouse name: N/A  . Number of children: N/A  . Years of education: N/A   Occupational History  . Not on file.   Social History Main Topics  . Smoking status: Former Smoker    Quit date: 10/19/1977  . Smokeless tobacco: Never Used  . Alcohol use 0.0 oz/week     Comment: very seldom  . Drug use: No  . Sexual activity: Not on file   Other Topics Concern  . Not on file   Social History Narrative  . No narrative on file    Outpatient Encounter  Prescriptions as of 12/22/2016  Medication Sig Note  . Ascorbic Acid (VITAMIN C) 1000 MG tablet Take 1,000 mg by mouth daily.   . B Complex Vitamins (VITAMIN B COMPLEX PO) Take 1 tablet by mouth daily.   . beta carotene 25000 UNIT capsule Take 25,000 Units by mouth daily.   . calcium carbonate (OS-CAL) 600 MG TABS tablet Take 600 mg by mouth daily with breakfast.   . celecoxib (CELEBREX) 200 MG capsule TAKE 1 CAPSULE BY MOUTH EVERY DAY   . cholecalciferol (VITAMIN D) 1000 UNITS tablet Take 1,000 Units by mouth daily.   Marland Kitchen CIALIS 5 MG tablet TAKE 1 TABLET BY MOUTH DAILY AS NEEDED FOR ERECTILE DYSFUNCTION.   . cyclobenzaprine (FLEXERIL) 10 MG tablet Take by mouth. Reported on 10/24/2015 12/22/2016: prn  . fish oil-omega-3 fatty acids 1000 MG capsule Take 1 g by mouth daily.   Marland Kitchen GLUCOSAMINE-CHONDROIT-VIT C-MN PO Take by mouth. 03/18/2015: OTC Received from: [facility]  . metoprolol succinate (TOPROL-XL) 25 MG 24 hr tablet Take 1 tablet (25 mg total) by mouth daily. Take with or immediately following a meal.   . Multiple Vitamin (MULTIVITAMIN WITH MINERALS) TABS tablet Take 1 tablet by mouth daily.   Marland Kitchen omeprazole (PRILOSEC) 20 MG capsule Take by mouth. 03/18/2015: Medication taken as needed.  Received from: [facility]  . oxyCODONE (ROXICODONE)  15 MG immediate release tablet Take 1 tablet (15 mg total) by mouth every 8 (eight) hours as needed for pain.   Marland Kitchen oxyCODONE (ROXICODONE) 15 MG immediate release tablet Take 1 tablet (15 mg total) by mouth every 8 (eight) hours as needed for pain.   . Selenium 200 MCG TABS Take 200 mcg by mouth daily.   Marland Kitchen VITAMIN E PO Take 1 capsule by mouth daily.    No facility-administered encounter medications on file as of 12/22/2016.     No Known Allergies  Review of Systems  Constitutional: Positive for malaise/fatigue.       Flushing symptoms  Respiratory: Negative.   Cardiovascular: Negative.   Musculoskeletal: Positive for back pain  and joint pain.       Muscle cramps, stiffness  Psychiatric/Behavioral: The patient is nervous/anxious.        Jittery    Objective:  BP (!) 146/62   Pulse 66   Temp 98.6 F (37 C)   Resp 16   Wt 165 lb (74.8 kg)   BMI 21.77 kg/m   Physical Exam  Constitutional: He is oriented to person, place, and time and well-developed, well-nourished, and in no distress.  HENT:  Head: Normocephalic and atraumatic.  Eyes: Conjunctivae are normal. No scleral icterus.  Neck: No thyromegaly present.  Cardiovascular: Normal rate, regular rhythm and normal heart sounds.   Pulmonary/Chest: Effort normal and breath sounds normal.  Abdominal: Soft.  Neurological: He is alert and oriented to person, place, and time. Gait normal. GCS score is 15.  Skin: Skin is warm and dry.  Psychiatric: Mood, memory, affect and judgment normal.    Assessment and Plan :  1. Essential (primary) hypertension Elevated. Will increase Metoprolol back to 50 mg and re check on the next visit.  2. Other chronic pain/DDD Refills for Oxycodone provided x 3.  3. Gastroesophageal reflux disease, esophagitis presence not specified  4. BPH associated with nocturia HPI, Exam and A&P transcribed under direction and in the presence of Miguel Aschoff, MD. I have done the exam and reviewed the chart and it is accurate to the best of my knowledge. Development worker, community has been used and  any errors in dictation or transcription are unintentional. Miguel Aschoff M.D. [facility]

## 2017-01-01 ENCOUNTER — Other Ambulatory Visit: Payer: Self-pay | Admitting: Family Medicine

## 2017-01-01 DIAGNOSIS — R351 Nocturia: Secondary | ICD-10-CM

## 2017-02-11 ENCOUNTER — Emergency Department: Payer: Medicare Other

## 2017-02-11 ENCOUNTER — Encounter: Payer: Self-pay | Admitting: *Deleted

## 2017-02-11 ENCOUNTER — Emergency Department
Admission: EM | Admit: 2017-02-11 | Discharge: 2017-02-11 | Disposition: A | Discharge status: Home / Self Care | Payer: Medicare Other | Attending: Emergency Medicine | Admitting: Emergency Medicine

## 2017-02-11 DIAGNOSIS — Y939 Activity, unspecified: Secondary | ICD-10-CM | POA: Diagnosis not present

## 2017-02-11 DIAGNOSIS — W1839XA Other fall on same level, initial encounter: Secondary | ICD-10-CM | POA: Insufficient documentation

## 2017-02-11 DIAGNOSIS — M549 Dorsalgia, unspecified: Secondary | ICD-10-CM | POA: Diagnosis not present

## 2017-02-11 DIAGNOSIS — Y9222 Religious institution as the place of occurrence of the external cause: Secondary | ICD-10-CM | POA: Diagnosis not present

## 2017-02-11 DIAGNOSIS — M546 Pain in thoracic spine: Secondary | ICD-10-CM | POA: Diagnosis not present

## 2017-02-11 DIAGNOSIS — Y999 Unspecified external cause status: Secondary | ICD-10-CM | POA: Insufficient documentation

## 2017-02-11 DIAGNOSIS — Z87891 Personal history of nicotine dependence: Secondary | ICD-10-CM | POA: Diagnosis not present

## 2017-02-11 DIAGNOSIS — J869 Pyothorax without fistula: Secondary | ICD-10-CM | POA: Diagnosis not present

## 2017-02-11 DIAGNOSIS — S299XXA Unspecified injury of thorax, initial encounter: Secondary | ICD-10-CM | POA: Diagnosis not present

## 2017-02-11 DIAGNOSIS — S3992XA Unspecified injury of lower back, initial encounter: Secondary | ICD-10-CM | POA: Diagnosis present

## 2017-02-11 DIAGNOSIS — W19XXXA Unspecified fall, initial encounter: Secondary | ICD-10-CM | POA: Diagnosis not present

## 2017-02-11 DIAGNOSIS — Z79899 Other long term (current) drug therapy: Secondary | ICD-10-CM | POA: Diagnosis not present

## 2017-02-11 DIAGNOSIS — I10 Essential (primary) hypertension: Secondary | ICD-10-CM | POA: Diagnosis not present

## 2017-02-11 DIAGNOSIS — S20229A Contusion of unspecified back wall of thorax, initial encounter: Secondary | ICD-10-CM

## 2017-02-11 DIAGNOSIS — R0602 Shortness of breath: Secondary | ICD-10-CM | POA: Diagnosis not present

## 2017-02-11 HISTORY — DX: Essential (primary) hypertension: I10

## 2017-02-11 MED ORDER — KETOROLAC TROMETHAMINE 60 MG/2ML IM SOLN
30.0000 mg | Freq: Once | INTRAMUSCULAR | Status: AC
  Administered 2017-02-11: 30 mg via INTRAMUSCULAR
  Filled 2017-02-11: qty 2

## 2017-02-11 MED ORDER — ORPHENADRINE CITRATE 30 MG/ML IJ SOLN
60.0000 mg | INTRAMUSCULAR | Status: AC
  Administered 2017-02-11: 60 mg via INTRAMUSCULAR
  Filled 2017-02-11: qty 2

## 2017-02-11 MED ORDER — ORPHENADRINE CITRATE ER 100 MG PO TB12: 100.0000 mg | ORAL_TABLET | Freq: Two times a day (BID) | ORAL | 0 refills | Status: DC | PRN

## 2017-02-11 NOTE — Discharge Instructions (Signed)
Your exam and x-rays do not reveal any acute injuries or fractures. You may have a mild (age unknown) compression deformity to the vertebra of the midback. It is stable and requires no intervention. Take the muscle relaxant along with your home meds for pain and inflammation. Follow-up with Dr. [name] and/or Dr. Ellene Route for ongoing or worsening symptoms.
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
## 2017-03-04 DIAGNOSIS — M546 Pain in thoracic spine: Secondary | ICD-10-CM | POA: Diagnosis not present

## 2017-03-04 DIAGNOSIS — S22000A Wedge compression fracture of unspecified thoracic vertebra, initial encounter for closed fracture: Secondary | ICD-10-CM | POA: Diagnosis not present

## 2017-03-12 DIAGNOSIS — H2513 Age-related nuclear cataract, bilateral: Secondary | ICD-10-CM | POA: Diagnosis not present

## 2017-03-18 ENCOUNTER — Ambulatory Visit (INDEPENDENT_AMBULATORY_CARE_PROVIDER_SITE_OTHER): Payer: Medicare Other | Admitting: Family Medicine

## 2017-03-18 ENCOUNTER — Encounter: Payer: Self-pay | Admitting: Family Medicine

## 2017-03-18 VITALS — BP 138/60 | HR 58 | Temp 97.9°F | Resp 16 | Wt 163.0 lb

## 2017-03-18 DIAGNOSIS — I10 Essential (primary) hypertension: Secondary | ICD-10-CM

## 2017-03-18 DIAGNOSIS — G8929 Other chronic pain: Secondary | ICD-10-CM

## 2017-03-18 DIAGNOSIS — S22069A Unspecified fracture of T7-T8 vertebra, initial encounter for closed fracture: Secondary | ICD-10-CM | POA: Diagnosis not present

## 2017-03-18 DIAGNOSIS — I700 Atherosclerosis of aorta: Secondary | ICD-10-CM | POA: Diagnosis not present

## 2017-03-18 MED ORDER — OXYCODONE HCL 15 MG PO TABS: 15.0000 mg | ORAL_TABLET | Freq: Three times a day (TID) | ORAL | 0 refills | Status: DC | PRN

## 2017-03-18 MED ORDER — OXYCODONE HCL 15 MG PO TABS: 15.0000 mg | ORAL_TABLET | Freq: Four times a day (QID) | ORAL | 0 refills | Status: DC | PRN

## 2017-03-18 MED ORDER — ROSUVASTATIN CALCIUM 5 MG PO TABS: 5.0000 mg | ORAL_TABLET | Freq: Every day | ORAL | 11 refills | Status: DC

## 2017-03-18 NOTE — Progress Notes (Signed)
Subjective:  HPI Pt is here for a follow up of HTN. His Metoprolol was in increased to 50 mg daily. He reports that at home 130-140's /60's when he was checking prior to his fall. He had an ED visit from a fall on 02/11/17 and has a fracture in the thoracic area. He was told at the ED that he did not have a fracture but his neurosurgereon told him he definitely had a fracture.  He has seen his neurosurgeon and told to see if it will heal itself but pt says he needs to see him again because something needs to be done he is hurting. He gets pain medication for chronic back pain. He ran out of pain medication and has been out for about 3 days so he has been taking a lot of tylenol. He will need refills today he is not sleeping well.   Prior to Admission medications   Medication Sig Start Date End Date Taking? Authorizing Provider  Ascorbic Acid (VITAMIN C) 1000 MG tablet Take 1,000 mg by mouth daily.    [provider]  B Complex Vitamins (VITAMIN B COMPLEX PO) Take 1 tablet by mouth daily.    [provider]  beta carotene 25000 UNIT capsule Take 25,000 Units by mouth daily.    [provider]  calcium carbonate (OS-CAL) 600 MG TABS tablet Take 600 mg by mouth daily with breakfast.    [provider]  celecoxib (CELEBREX) 200 MG capsule TAKE 1 CAPSULE BY MOUTH EVERY DAY 05/16/16   Jerrol Banana., MD  cholecalciferol (VITAMIN D) 1000 UNITS tablet Take 1,000 Units by mouth daily.    [provider]  cyclobenzaprine (FLEXERIL) 10 MG tablet Take by mouth. Reported on 10/24/2015 08/26/12   [provider]  fish oil-omega-3 fatty acids 1000 MG capsule Take 1 g by mouth daily.    [provider]  GLUCOSAMINE-CHONDROIT-VIT C-MN PO Take by mouth. 01/16/15   [provider]  metoprolol succinate (TOPROL-XL) 50 MG 24 hr tablet Take with or immediately following a meal. 12/22/16   Jerrol Banana., MD  Multiple Vitamin  (MULTIVITAMIN WITH MINERALS) TABS tablet Take 1 tablet by mouth daily.    [provider]  omeprazole (PRILOSEC) 20 MG capsule Take by mouth. 12/17/12   [provider]  orphenadrine (NORFLEX) 100 MG tablet Take 1 tablet (100 mg total) by mouth 2 (two) times daily as needed for muscle spasms. 02/11/17   Menshew, Dannielle Karvonen, PA-C  oxyCODONE (ROXICODONE) 15 MG immediate release tablet Take 1 tablet (15 mg total) by mouth every 8 (eight) hours as needed for pain. 12/22/16   Jerrol Banana., MD  oxyCODONE (ROXICODONE) 15 MG immediate release tablet Take 1 tablet (15 mg total) by mouth every 8 (eight) hours as needed for pain. 12/22/16   Jerrol Banana., MD  Selenium 200 MCG TABS Take 200 mcg by mouth daily.    [provider]  tadalafil (CIALIS) 5 MG tablet ! Table daily for BPH, not ED 01/01/17   Jerrol Banana., MD  VITAMIN E PO Take 1 capsule by mouth daily.    [provider]    Patient Active Problem List   Diagnosis Date Noted  . BPH associated with nocturia 08/05/2016  . Esophageal reflux 03/18/2015  . Arthritis 03/18/2015  . Back pain, chronic 03/18/2015  . Cardiac conduction disorder 03/18/2015  . Narrowing of intervertebral disc space 03/18/2015  .  Essential (primary) hypertension 03/18/2015  . History of anemia 03/18/2015  . History of atrial fibrillation 03/18/2015  . Weak pulse 03/18/2015  . Awareness of heartbeats 03/18/2015  . Peyronie's disease 03/18/2015  . Raynaud's syndrome 03/18/2015  . Complete rotator cuff rupture of left shoulder 05/12/2013    Past Medical History:  Diagnosis Date  . Hypertension     Social History   Social History  . Marital status: Married    Spouse name: N/A  . Number of children: N/A  . Years of education: N/A   Occupational History  . Not on file.   Social History Main Topics  . Smoking status: Former Smoker    Quit date: 10/19/1977  . Smokeless tobacco: Never Used  . Alcohol  use 0.0 oz/week     Comment: very seldom  . Drug use: No  . Sexual activity: Not on file   Other Topics Concern  . Not on file   Social History Narrative  . No narrative on file    No Known Allergies  Review of Systems  Constitutional: Negative.   HENT: Negative.   Eyes: Negative.   Respiratory: Negative.   Cardiovascular: Negative.   Gastrointestinal: Negative.   Genitourinary: Negative.   Musculoskeletal: Positive for back pain.  Skin: Negative.   Neurological: Negative.   Endo/Heme/Allergies: Negative.   Psychiatric/Behavioral: Negative.     Immunization History  Administered Date(s) Administered  . Influenza, High Dose Seasonal PF 07/30/2015  . Pneumococcal Conjugate-13 08/08/2014  . Pneumococcal Polysaccharide-23 11/15/2012  . Td 06/12/2016  . Zoster 04/09/2010    Objective:  BP 138/60 (BP Location: Left Arm, Patient Position: Sitting, Cuff Size: Normal)   Pulse (!) 58   Temp 97.9 F (36.6 C) (Oral)   Resp 16   Wt 163 lb (73.9 kg)   SpO2 98%   BMI 21.51 kg/m   Physical Exam  Constitutional: He is oriented to person, place, and time and well-developed, well-nourished, and in no distress.  HENT:  Head: Normocephalic and atraumatic.  Right Ear: External ear normal.  Left Ear: External ear normal.  Nose: Nose normal.  Eyes: Conjunctivae are normal.  Neck: Neck supple.  Cardiovascular: Normal rate and normal heart sounds.   Pulmonary/Chest: Effort normal.  Abdominal: Soft.  Musculoskeletal:  Minimal tenderness over mid T spine.  Neurological: He is alert and oriented to person, place, and time. Gait normal.  Skin: Skin is warm and dry.  Psychiatric: Mood, memory, affect and judgment normal.    Lab Results  Component Value Date   WBC 4.5 06/10/2016   HGB 13.6 01/16/2015   HCT 39.0 06/10/2016   PLT 192 06/10/2016   GLUCOSE 65 06/10/2016   CHOL 193 06/10/2016   TRIG 99 06/10/2016   HDL 91 06/10/2016   LDLCALC 82 06/10/2016   TSH 1.100  06/10/2016   PSA 0.9 05/16/2014    CMP     Component Value Date/Time   NA 139 06/10/2016 1135   K 4.7 06/10/2016 1135   CL 99 06/10/2016 1135   CO2 26 06/10/2016 1135   GLUCOSE 65 06/10/2016 1135   BUN 17 06/10/2016 1135   CREATININE 0.92 06/10/2016 1135   CALCIUM 9.4 06/10/2016 1135   PROT 6.6 06/10/2016 1135   ALBUMIN 4.5 06/10/2016 1135   AST 19 06/10/2016 1135   ALT 15 06/10/2016 1135   ALKPHOS 50 06/10/2016 1135   BILITOT 0.5 06/10/2016 1135   GFRNONAA 85 06/10/2016 1135   GFRAA 98 06/10/2016 1135  Assessment and Plan :  1. Essential (primary) hypertension Improved. Continue current dose.   2. Other chronic pain Refills x 3. Increase to 4 daily due to recent fracture.   - oxyCODONE (ROXICODONE) 15 MG immediate release tablet; Take 1 tablet (15 mg total) by mouth every 6 (six) hours as needed for pain.  Dispense: 120 tablet; Refill: 0 - oxyCODONE (ROXICODONE) 15 MG immediate release tablet; Take 1 tablet (15 mg total) by mouth every 8 (eight) hours as needed for pain.  Dispense: 120 tablet; Refill: 0  3. Atherosclerosis of aorta ([place]) Found on chest xray after fall. Lipids not quite low enough. Start Crestor and follow up in 2 months. Recheck lipids and other labs at that time.   - rosuvastatin (CRESTOR) 5 MG tablet; Take 1 tablet (5 mg total) by mouth daily.  Dispense: 30 tablet; Refill: 11  4. Closed fracture of seventh thoracic vertebra, unspecified fracture morphology, initial encounter ([facility])  - oxyCODONE (ROXICODONE) 15 MG immediate release tablet; Take 1 tablet (15 mg total) by mouth every 6 (six) hours as needed for pain.  Dispense: 120 tablet; Refill: 0 - oxyCODONE (ROXICODONE) 15 MG immediate release tablet; Take 1 tablet (15 mg total) by mouth every 8 (eight) hours as needed for pain.  Dispense: 120 tablet; Refill: 0   HPI, Exam, and A&P Transcribed under the direction and in the presence of Richard L. Cranford Mon, MD  Electronically Signed: Katina Dung, [place] MD [facility] 03/18/2017 2:23 PM

## 2017-03-19 ENCOUNTER — Ambulatory Visit: Payer: Medicare Other | Admitting: Family Medicine

## 2017-03-26 DIAGNOSIS — S22000A Wedge compression fracture of unspecified thoracic vertebra, initial encounter for closed fracture: Secondary | ICD-10-CM | POA: Diagnosis not present

## 2017-04-27 ENCOUNTER — Telehealth: Payer: Self-pay | Admitting: Family Medicine

## 2017-04-28 NOTE — Telephone Encounter (Signed)
Pt advised to fast for his appointment it has been a year since last lab work was done-aa

## 2017-05-18 ENCOUNTER — Telehealth: Payer: Self-pay

## 2017-05-18 NOTE — Telephone Encounter (Signed)
FYI:  Spoke with pharmacist at [facility] she had a question about patient's Oxycodone RX they received from the patient. On last office visit we increased Oxycodone to take up to 4 tablets daily, so it was changed to 1 every 6 hours #120 but not all the RXs he brought in said this. I advised pharmacist this was changed on last visit but looks like the other 2 RXs did not get changed appropriately. They will go ahead and fill it, just for future refills if patient is going to stay on these directions to refill it correct sig next time.-aa

## 2017-05-20 ENCOUNTER — Telehealth: Payer: Self-pay | Admitting: Family Medicine

## 2017-05-20 ENCOUNTER — Encounter: Payer: Self-pay | Admitting: Family Medicine

## 2017-05-20 ENCOUNTER — Ambulatory Visit (INDEPENDENT_AMBULATORY_CARE_PROVIDER_SITE_OTHER): Payer: Medicare Other | Admitting: Family Medicine

## 2017-05-20 VITALS — BP 120/60 | HR 60 | Temp 97.5°F | Resp 16 | Wt 158.0 lb

## 2017-05-20 DIAGNOSIS — E785 Hyperlipidemia, unspecified: Secondary | ICD-10-CM | POA: Insufficient documentation

## 2017-05-20 DIAGNOSIS — R351 Nocturia: Secondary | ICD-10-CM

## 2017-05-20 DIAGNOSIS — Z7952 Long term (current) use of systemic steroids: Secondary | ICD-10-CM

## 2017-05-20 DIAGNOSIS — S22069A Unspecified fracture of T7-T8 vertebra, initial encounter for closed fracture: Secondary | ICD-10-CM

## 2017-05-20 DIAGNOSIS — E7800 Pure hypercholesterolemia, unspecified: Secondary | ICD-10-CM | POA: Diagnosis not present

## 2017-05-20 DIAGNOSIS — I700 Atherosclerosis of aorta: Secondary | ICD-10-CM | POA: Diagnosis not present

## 2017-05-20 DIAGNOSIS — G8929 Other chronic pain: Secondary | ICD-10-CM

## 2017-05-20 DIAGNOSIS — N401 Enlarged prostate with lower urinary tract symptoms: Secondary | ICD-10-CM | POA: Diagnosis not present

## 2017-05-20 MED ORDER — OXYCODONE HCL 15 MG PO TABS: 15.0000 mg | ORAL_TABLET | Freq: Three times a day (TID) | ORAL | 0 refills | Status: DC | PRN

## 2017-05-20 NOTE — Telephone Encounter (Signed)
Please check--thx

## 2017-05-20 NOTE — Telephone Encounter (Signed)
Pt called saying he was in earlier today and he has asked for his cholesterol med the generic Crestor 5 mg to be refilled for 90 days instead of 30 days.  He is not sure that this was done.  He uses [facility] in [place].  Daniel Owens

## 2017-05-20 NOTE — Progress Notes (Signed)
Patient: Daniel Owens Male    DOB: [birth year]   71 y.o.   MRN: [id number] Visit Date: 05/20/2017  Today's Provider: [name], MD   Chief Complaint  Patient presents with  . Hyperlipidemia   Subjective:    HPI      Lipid/Cholesterol, Follow-up:   Last seen for this 2 months ago.  Management changes since that visit include starting Crestor 5 mg. He is requesting 90 day refill. Pt was found to have atherosclerosis of the aorta; he needed better control of lipids. He is requesting a "test to see how bad this is clogged up". . Last Lipid Panel:    Component Value Date/Time   CHOL 193 06/10/2016 1135   TRIG 99 06/10/2016 1135   HDL 91 06/10/2016 1135   CHOLHDL 2.1 06/10/2016 1135   LDLCALC 82 06/10/2016 1135    Risk factors for vascular disease include arteriosclerotic heart disease and hypertension  He reports good compliance with treatment. He is not having side effects.  Weight trend: decreasing steadily Current diet: in general, a "healthy" diet   Current exercise: "very little" since back injury  Wt Readings from Last 3 Encounters:  05/20/17 158 lb (71.7 kg)  03/18/17 163 lb (73.9 kg)  02/11/17 165 lb (74.8 kg)    ------------------------------------------------------------------- Pt is requesting all labs today, including PSA. He is experiencing a weak stream. Pt is getting steroid injections for his back pain, and is concerned about osteoporosis.   No Known Allergies   Current Outpatient Prescriptions:  .  Ascorbic Acid (VITAMIN C) 1000 MG tablet, Take 1,000 mg by mouth daily., Disp: , Rfl:  .  B Complex Vitamins (VITAMIN B COMPLEX PO), Take 1 tablet by mouth daily., Disp: , Rfl:  .  beta carotene 25000 UNIT capsule, Take 25,000 Units by mouth daily., Disp: , Rfl:  .  celecoxib (CELEBREX) 200 MG capsule, TAKE 1 CAPSULE BY MOUTH EVERY DAY (Patient taking differently: TAKE 1 CAPSULE BY MOUTH DAILY AS NEEDED), Disp: 90 capsule, Rfl: 3 .   cholecalciferol (VITAMIN D) 1000 UNITS tablet, Take 1,000 Units by mouth daily., Disp: , Rfl:  .  cyclobenzaprine (FLEXERIL) 10 MG tablet, Take by mouth. Reported on 10/24/2015, Disp: , Rfl:  .  fish oil-omega-3 fatty acids 1000 MG capsule, Take 1 g by mouth daily., Disp: , Rfl:  .  GLUCOSAMINE-CHONDROIT-VIT C-MN PO, Take by mouth., Disp: , Rfl:  .  metoprolol succinate (TOPROL-XL) 50 MG 24 hr tablet, Take with or immediately following a meal., Disp: 90 tablet, Rfl: 3 .  Multiple Vitamin (MULTIVITAMIN WITH MINERALS) TABS tablet, Take 1 tablet by mouth daily., Disp: , Rfl:  .  omeprazole (PRILOSEC) 20 MG capsule, Take by mouth., Disp: , Rfl:  .  oxyCODONE (ROXICODONE) 15 MG immediate release tablet, Take 1 tablet (15 mg total) by mouth every 8 (eight) hours as needed for pain., Disp: 120 tablet, Rfl: 0 .  rosuvastatin (CRESTOR) 5 MG tablet, Take 1 tablet (5 mg total) by mouth daily., Disp: 30 tablet, Rfl: 11 .  Selenium 200 MCG TABS, Take 200 mcg by mouth daily., Disp: , Rfl:  .  tadalafil (CIALIS) 5 MG tablet, ! Table daily for BPH, not ED, Disp: 90 tablet, Rfl: 3  Review of Systems  Constitutional: Negative.   Eyes: Negative.   Cardiovascular: Positive for palpitations (occasioanlly and is improving) and leg swelling. Negative for chest pain.  Endocrine: Negative.   Genitourinary: Positive for difficulty urinating (weak  stream).  Musculoskeletal: Positive for back pain.  Allergic/Immunologic: Negative.   Neurological: Negative.   Hematological: Negative.   Psychiatric/Behavioral: Negative.     Social History  Substance Use Topics  . Smoking status: Former Smoker    Quit date: 10/19/1977  . Smokeless tobacco: Never Used  . Alcohol use 0.0 oz/week     Comment: very seldom   Objective:   BP 120/60 (BP Location: Left Arm, Patient Position: Sitting, Cuff Size: Normal)   Pulse 60   Temp (!) 97.5 F (36.4 C) (Oral)   Resp 16   Wt 158 lb (71.7 kg)   BMI 20.85 kg/m  Vitals:    05/20/17 0830  BP: 120/60  Pulse: 60  Resp: 16  Temp: (!) 97.5 F (36.4 C)  TempSrc: Oral  Weight: 158 lb (71.7 kg)     Physical Exam  Constitutional: He is oriented to person, place, and time. He appears well-developed and well-nourished.  HENT:  Head: Normocephalic and atraumatic.  Right Ear: External ear normal.  Left Ear: External ear normal.  Nose: Nose normal.  Eyes: Conjunctivae are normal.  Neck: Normal range of motion.  Cardiovascular: Normal rate, regular rhythm and normal heart sounds.   Pulmonary/Chest: Effort normal and breath sounds normal.  Abdominal: Soft. There is no tenderness.  Musculoskeletal: He exhibits edema (trace).  Neurological: He is alert and oriented to person, place, and time.  Skin: Skin is warm and dry.  Psychiatric: He has a normal mood and affect. His behavior is normal. Judgment and thought content normal.        Assessment & Plan:     1. Atherosclerosis of aorta ([place]) Will recheck lipids and refer to cardiology for further workup.  - Ambulatory referral to Cardiology - Lipid panel  2. BPH associated with nocturia Will check PSA. Pt is concerned due to strong family history of prostate cancer. - PSA  3. Pure hypercholesterolemia Will check labs. FU pending results. - CBC with Differential/Platelet - Lipid panel - Comprehensive metabolic panel - TSH  4. Other chronic pain Refills provided.  - oxyCODONE (ROXICODONE) 15 MG immediate release tablet; Take 1 tablet (15 mg total) by mouth every 8 (eight) hours as needed for pain.  Dispense: 120 tablet; Refill: 0  5. Closed fracture of seventh thoracic vertebra, unspecified fracture morphology, initial encounter ([place]) Refills provided. - oxyCODONE (ROXICODONE) 15 MG immediate release tablet; Take 1 tablet (15 mg total) by mouth every 8 (eight) hours as needed for pain.  Dispense: 120 tablet; Refill: 0  6. Current chronic use of systemic steroids Pt is concerned of possible OP due to  steroid back injections. Will order Dexa scan. FU pending results. - DG Bone Density      I have done the exam and reviewed the above chart and it is accurate to the best of my knowledge. Development worker, community has been used in this note in any air is in the dictation or transcription are unintentional.  [name], MD  [place]

## 2017-05-21 LAB — COMPREHENSIVE METABOLIC PANEL
ALT: 12 IU/L (ref 0–44)
AST: 21 IU/L (ref 0–40)
Albumin/Globulin Ratio: 1.8 (ref 1.2–2.2)
Albumin: 4.4 g/dL (ref 3.6–4.8)
Alkaline Phosphatase: 53 IU/L (ref 39–117)
BUN/Creatinine Ratio: 22 (ref 10–24)
BUN: 18 mg/dL (ref 8–27)
Bilirubin Total: 0.5 mg/dL (ref 0.0–1.2)
CO2: 25 mmol/L (ref 20–29)
Calcium: 9.5 mg/dL (ref 8.6–10.2)
Chloride: 101 mmol/L (ref 96–106)
Creatinine, Ser: 0.81 mg/dL (ref 0.76–1.27)
GFR calc Af Amer: 105 mL/min/{1.73_m2} (ref >59)
GFR calc non Af Amer: 91 mL/min/{1.73_m2} (ref >59)
Globulin, Total: 2.5 g/dL (ref 1.5–4.5)
Glucose: 85 mg/dL (ref 65–99)
Potassium: 4.6 mmol/L (ref 3.5–5.2)
Sodium: 141 mmol/L (ref 134–144)
Total Protein: 6.9 g/dL (ref 6.0–8.5)

## 2017-05-21 LAB — LIPID PANEL
Chol/HDL Ratio: 1.8 ratio (ref 0.0–5.0)
Cholesterol, Total: 149 mg/dL (ref 100–199)
HDL: 84 mg/dL (ref >39)
LDL Calculated: 44 mg/dL (ref 0–99)
Triglycerides: 103 mg/dL (ref 0–149)
VLDL Cholesterol Cal: 21 mg/dL (ref 5–40)

## 2017-05-21 LAB — CBC WITH DIFFERENTIAL/PLATELET
Basophils Absolute: 0 10*3/uL (ref 0.0–0.2)
Basos: 0 %
EOS (ABSOLUTE): 0.1 10*3/uL (ref 0.0–0.4)
Eos: 1 %
Hematocrit: 40.3 % (ref 37.5–51.0)
Hemoglobin: 13.4 g/dL (ref 13.0–17.7)
Immature Grans (Abs): 0 10*3/uL (ref 0.0–0.1)
Immature Granulocytes: 0 %
Lymphocytes Absolute: 1.3 10*3/uL (ref 0.7–3.1)
Lymphs: 25 %
MCH: 34.4 pg — ABNORMAL HIGH (ref 26.6–33.0)
MCHC: 33.3 g/dL (ref 31.5–35.7)
MCV: 103 fL — ABNORMAL HIGH (ref 79–97)
Monocytes Absolute: 0.6 10*3/uL (ref 0.1–0.9)
Monocytes: 12 %
Neutrophils Absolute: 3.2 10*3/uL (ref 1.4–7.0)
Neutrophils: 62 %
Platelets: 188 10*3/uL (ref 150–379)
RBC: 3.9 x10E6/uL — ABNORMAL LOW (ref 4.14–5.80)
RDW: 12.6 % (ref 12.3–15.4)
WBC: 5.2 10*3/uL (ref 3.4–10.8)

## 2017-05-21 LAB — TSH: TSH: 1.32 u[IU]/mL (ref 0.450–4.500)

## 2017-05-21 LAB — PSA: Prostate Specific Ag, Serum: 0.8 ng/mL (ref 0.0–4.0)

## 2017-05-21 MED ORDER — ROSUVASTATIN CALCIUM 5 MG PO TABS: 5.0000 mg | ORAL_TABLET | Freq: Every day | ORAL | 3 refills | Status: DC

## 2017-05-21 NOTE — Telephone Encounter (Signed)
Done-aa 

## 2017-05-28 DIAGNOSIS — M546 Pain in thoracic spine: Secondary | ICD-10-CM | POA: Diagnosis not present

## 2017-05-28 DIAGNOSIS — S22000A Wedge compression fracture of unspecified thoracic vertebra, initial encounter for closed fracture: Secondary | ICD-10-CM | POA: Diagnosis not present

## 2017-05-28 DIAGNOSIS — I10 Essential (primary) hypertension: Secondary | ICD-10-CM | POA: Diagnosis not present

## 2017-06-05 ENCOUNTER — Ambulatory Visit
Admission: RE | Admit: 2017-06-05 | Discharge: 2017-06-05 | Disposition: A | Discharge status: Home / Self Care | Payer: Medicare Other | Source: Ambulatory Visit | Attending: Family Medicine | Admitting: Family Medicine

## 2017-06-05 DIAGNOSIS — M85851 Other specified disorders of bone density and structure, right thigh: Secondary | ICD-10-CM | POA: Diagnosis not present

## 2017-06-05 DIAGNOSIS — Z7952 Long term (current) use of systemic steroids: Secondary | ICD-10-CM | POA: Insufficient documentation

## 2017-06-08 NOTE — Progress Notes (Signed)
Advised  ED 

## 2017-06-12 ENCOUNTER — Ambulatory Visit (INDEPENDENT_AMBULATORY_CARE_PROVIDER_SITE_OTHER): Payer: Medicare Other

## 2017-06-12 VITALS — Ht 73.0 in

## 2017-06-12 DIAGNOSIS — Z0000 Encounter for general adult medical examination without abnormal findings: Secondary | ICD-10-CM

## 2017-06-12 DIAGNOSIS — Z1159 Encounter for screening for other viral diseases: Secondary | ICD-10-CM

## 2017-06-12 NOTE — Progress Notes (Signed)
Subjective:   Daniel Owens is a 71 y.o. male who presents for Medicare Annual/Subsequent preventive examination.  Review of Systems:  N/A  Cardiac Risk Factors include: advanced age (>59men, >53 women);dyslipidemia;hypertension;male gender     Objective:    Vitals: Ht 6\' 1"  (1.854 m)   There is no height or weight on file to calculate BMI.  Tobacco History  Smoking Status  . Former Smoker  . Quit date: 10/19/1977  Smokeless Tobacco  . Never Used     Counseling given: Not Answered   Past Medical History:  Diagnosis Date  . Hypertension    Past Surgical History:  Procedure Laterality Date  . APPENDECTOMY    . BILATERAL KNEE ARTHROSCOPY    . HERNIA REPAIR    . NECK SURGERY     involving disc  . SHOULDER SURGERY     Right-x 3 overall  . TENDON REPAIR     involving Tricep tendon   Family History  Problem Relation Age of Onset  . Stroke Mother   . Uterine cancer Mother   . Throat cancer Father   . Allergic rhinitis Brother   . Prostate cancer Maternal Uncle   . Prostate cancer Paternal Uncle    History  Sexual Activity  . Sexual activity: Not on file    Outpatient Encounter Prescriptions as of 06/12/2017  Medication Sig  . Ascorbic Acid (VITAMIN C) 1000 MG tablet Take 1,000 mg by mouth daily.  . B Complex Vitamins (VITAMIN B COMPLEX PO) Take 1 tablet by mouth daily.  . celecoxib (CELEBREX) 200 MG capsule TAKE 1 CAPSULE BY MOUTH EVERY DAY (Patient taking differently: TAKE 1 CAPSULE BY MOUTH DAILY AS NEEDED)  . cholecalciferol (VITAMIN D) 1000 UNITS tablet Take 1,000 Units by mouth daily.  . fish oil-omega-3 fatty acids 1000 MG capsule Take 1 g by mouth daily.  Marland Kitchen GLUCOSAMINE-CHONDROIT-VIT C-MN PO Take by mouth daily.   . metoprolol succinate (TOPROL-XL) 50 MG 24 hr tablet Take with or immediately following a meal.  . Multiple Vitamin (MULTIVITAMIN WITH MINERALS) TABS tablet Take 1 tablet by mouth daily.  Marland Kitchen oxyCODONE (ROXICODONE) 15 MG immediate release  tablet Take 1 tablet (15 mg total) by mouth every 8 (eight) hours as needed for pain.  . rosuvastatin (CRESTOR) 5 MG tablet Take 1 tablet (5 mg total) by mouth daily.  . Selenium 200 MCG TABS Take 200 mcg by mouth daily.  . tadalafil (CIALIS) 5 MG tablet ! Table daily for BPH, not ED  . [DISCONTINUED] omeprazole (PRILOSEC) 20 MG capsule Take by mouth.  . beta carotene 25000 UNIT capsule Take 25,000 Units by mouth daily.  . [DISCONTINUED] cyclobenzaprine (FLEXERIL) 10 MG tablet Take 10 mg by mouth as needed. Reported on 10/24/2015   No facility-administered encounter medications on file as of 06/12/2017.     Activities of Daily Living In your present state of health, do you have any difficulty performing the following activities: 06/12/2017  Hearing? N  Vision? N  Difficulty concentrating or making decisions? Y  Walking or climbing stairs? N  Dressing or bathing? N  Doing errands, shopping? N  Preparing Food and eating ? N  Using the Toilet? N  In the past six months, have you accidently leaked urine? N  Do you have problems with loss of bowel control? N  Managing your Medications? N  Managing your Finances? N  Housekeeping or managing your Housekeeping? N  Some recent data might be hidden    Patient Care  Team: Jerrol Banana., MD as PCP - General (Family Medicine)   Assessment:     Exercise Activities and Dietary recommendations Current Exercise Habits: Home exercise routine, Type of exercise: strength training/weights;walking, Time (Minutes): 60, Frequency (Times/Week): 3, Weekly Exercise (Minutes/Week): 180, Intensity: Mild, Exercise limited by: orthopedic condition(s)  Goals    . Increase water intake          Recommend increasing water intake to 4-6 glasses a day.       Fall Risk Fall Risk  06/12/2017 06/10/2016 06/05/2015  Falls in the past year? Yes Yes No  Number falls in past yr: 1 1 -  Injury with Fall? Yes No -  Comment 2 broken vertebreas  - -  Follow up  Falls prevention discussed - -   Depression Screen PHQ 2/9 Scores 06/12/2017 06/10/2016 06/05/2015  PHQ - 2 Score 0 0 0    Cognitive Function        Immunization History  Administered Date(s) Administered  . Influenza, High Dose Seasonal PF 07/30/2015  . Pneumococcal Conjugate-13 08/08/2014  . Pneumococcal Polysaccharide-23 11/15/2012  . Td 06/12/2016  . Zoster 04/09/2010   Screening Tests Health Maintenance  Topic Date Due  . Hepatitis C Screening  1946-05-01  . INFLUENZA VACCINE  05/20/2017  . COLONOSCOPY  12/25/2022  . TETANUS/TDAP  06/12/2026  . PNA vac Low Risk Adult  Completed      Plan:  I have personally reviewed and addressed the Medicare Annual Wellness questionnaire and have noted the following in the patient's chart:  A. Medical and social history B. Use of alcohol, tobacco or illicit drugs  C. Current medications and supplements D. Functional ability and status E.  Nutritional status F.  Physical activity G. Advance directives H. List of other physicians I.  Hospitalizations, surgeries, and ER visits in previous 12 months J.  [place] such as hearing and vision if needed, cognitive and depression L. Referrals and appointments - none  In addition, I have reviewed and discussed with patient certain preventive protocols, quality metrics, and best practice recommendations. A written personalized care plan for preventive services as well as general preventive health recommendations were provided to patient.  See attached scanned questionnaire for additional information.   Signed,  Fabio Neighbors, LPN Nurse Health Advisor   MD Recommendations:

## 2017-06-12 NOTE — Patient Instructions (Signed)
[name] , Thank you for taking time to come for your Medicare Wellness Visit. I appreciate your ongoing commitment to your health goals. Please review the following plan we discussed and let me know if I can assist you in the future.   Screening recommendations/referrals: Colonoscopy: up to date, due 12/2022 Recommended yearly ophthalmology/optometry visit for glaucoma screening and checkup Recommended yearly dental visit for hygiene and checkup  Vaccinations: Influenza vaccine: declined today Pneumococcal vaccine: completed series Tdap vaccine: up to date Shingles vaccine: completed 04/09/10  Advanced directives: Please bring a copy of your POA (Power of [place]) and/or Living Will to your next appointment.   Conditions/risks identified: Fall risk prevention; Recommend increasing water intake to 4-6 glasses a day.   Next appointment: 07/30/17 @ 10:30 AM  Preventive Care 65 Years and Older, Male Preventive care refers to lifestyle choices and visits with your health care provider that can promote health and wellness. What does preventive care include?  A yearly physical exam. This is also called an annual well check.  Dental exams once or twice a year.  Routine eye exams. Ask your health care provider how often you should have your eyes checked.  Personal lifestyle choices, including:  Daily care of your teeth and gums.  Regular physical activity.  Eating a healthy diet.  Avoiding tobacco and drug use.  Limiting alcohol use.  Practicing safe sex.  Taking low doses of aspirin every day.  Taking vitamin and mineral supplements as recommended by your health care provider. What happens during an annual well check? The services and screenings done by your health care provider during your annual well check will depend on your age, overall health, lifestyle risk factors, and family history of disease. Counseling  Your health care provider may ask you questions about  your:  Alcohol use.  Tobacco use.  Drug use.  Emotional well-being.  Home and relationship well-being.  Sexual activity.  Eating habits.  History of falls.  Memory and ability to understand (cognition).  Work and work Statistician. Screening  You may have the following tests or measurements:  Height, weight, and BMI.  Blood pressure.  Lipid and cholesterol levels. These may be checked every 5 years, or more frequently if you are over 35 years old.  Skin check.  Lung cancer screening. You may have this screening every year starting at age 56 if you have a 30-pack-year history of smoking and currently smoke or have quit within the past 15 years.  Fecal occult blood test (FOBT) of the stool. You may have this test every year starting at age 37.  Flexible sigmoidoscopy or colonoscopy. You may have a sigmoidoscopy every 5 years or a colonoscopy every 10 years starting at age 12.  Prostate cancer screening. Recommendations will vary depending on your family history and other risks.  Hepatitis C blood test.  Hepatitis B blood test.  Sexually transmitted disease (STD) testing.  Diabetes screening. This is done by checking your blood sugar (glucose) after you have not eaten for a while (fasting). You may have this done every 1-3 years.  Abdominal aortic aneurysm (AAA) screening. You may need this if you are a current or former smoker.  Osteoporosis. You may be screened starting at age 48 if you are at high risk. Talk with your health care provider about your test results, treatment options, and if necessary, the need for more tests. Vaccines  Your health care provider may recommend certain vaccines, such as:  Influenza vaccine. This is  recommended every year.  Tetanus, diphtheria, and acellular pertussis (Tdap, Td) vaccine. You may need a Td booster every 10 years.  Zoster vaccine. You may need this after age 74.  Pneumococcal 13-valent conjugate (PCV13) vaccine.  One dose is recommended after age 75.  Pneumococcal polysaccharide (PPSV23) vaccine. One dose is recommended after age 29. Talk to your health care provider about which screenings and vaccines you need and how often you need them. This information is not intended to replace advice given to you by your health care provider. Make sure you discuss any questions you have with your health care provider. Document Released: 11/02/2015 Document Revised: 06/25/2016 Document Reviewed: 08/07/2015 Elsevier Interactive Patient Education  2017 [place] Prevention in the Home Falls can cause injuries. They can happen to people of all ages. There are many things you can do to make your home safe and to help prevent falls. What can I do on the outside of my home?  Regularly fix the edges of walkways and driveways and fix any cracks.  Remove anything that might make you trip as you walk through a door, such as a raised step or threshold.  Trim any bushes or trees on the path to your home.  Use bright outdoor lighting.  Clear any walking paths of anything that might make someone trip, such as rocks or tools.  Regularly check to see if handrails are loose or broken. Make sure that both sides of any steps have handrails.  Any raised decks and porches should have guardrails on the edges.  Have any leaves, snow, or ice cleared regularly.  Use sand or salt on walking paths during winter.  Clean up any spills in your garage right away. This includes oil or grease spills. What can I do in the bathroom?  Use night lights.  Install grab bars by the toilet and in the tub and shower. Do not use towel bars as grab bars.  Use non-skid mats or decals in the tub or shower.  If you need to sit down in the shower, use a plastic, non-slip stool.  Keep the floor dry. Clean up any water that spills on the floor as soon as it happens.  Remove soap buildup in the tub or shower regularly.  Attach bath  mats securely with double-sided non-slip rug tape.  Do not have throw rugs and other things on the floor that can make you trip. What can I do in the bedroom?  Use night lights.  Make sure that you have a light by your bed that is easy to reach.  Do not use any sheets or blankets that are too big for your bed. They should not hang down onto the floor.  Have a firm chair that has side arms. You can use this for support while you get dressed.  Do not have throw rugs and other things on the floor that can make you trip. What can I do in the kitchen?  Clean up any spills right away.  Avoid walking on wet floors.  Keep items that you use a lot in easy-to-reach places.  If you need to reach something above you, use a strong step stool that has a grab bar.  Keep electrical cords out of the way.  Do not use floor polish or wax that makes floors slippery. If you must use wax, use non-skid floor wax.  Do not have throw rugs and other things on the floor that can make you trip.  What can I do with my stairs?  Do not leave any items on the stairs.  Make sure that there are handrails on both sides of the stairs and use them. Fix handrails that are broken or loose. Make sure that handrails are as long as the stairways.  Check any carpeting to make sure that it is firmly attached to the stairs. Fix any carpet that is loose or worn.  Avoid having throw rugs at the top or bottom of the stairs. If you do have throw rugs, attach them to the floor with carpet tape.  Make sure that you have a light switch at the top of the stairs and the bottom of the stairs. If you do not have them, ask someone to add them for you. What else can I do to help prevent falls?  Wear shoes that:  Do not have high heels.  Have rubber bottoms.  Are comfortable and fit you well.  Are closed at the toe. Do not wear sandals.  If you use a stepladder:  Make sure that it is fully opened. Do not climb a closed  stepladder.  Make sure that both sides of the stepladder are locked into place.  Ask someone to hold it for you, if possible.  Clearly mark and make sure that you can see:  Any grab bars or handrails.  First and last steps.  Where the edge of each step is.  Use tools that help you move around (mobility aids) if they are needed. These include:  Canes.  Walkers.  Scooters.  Crutches.  Turn on the lights when you go into a dark area. Replace any light bulbs as soon as they burn out.  Set up your furniture so you have a clear path. Avoid moving your furniture around.  If any of your floors are uneven, fix them.  If there are any pets around you, be aware of where they are.  Review your medicines with your doctor. Some medicines can make you feel dizzy. This can increase your chance of falling. Ask your doctor what other things that you can do to help prevent falls. This information is not intended to replace advice given to you by your health care provider. Make sure you discuss any questions you have with your health care provider. Document Released: 08/02/2009 Document Revised: 03/13/2016 Document Reviewed: 11/10/2014 Elsevier Interactive Patient Education  2017 [employer].

## 2017-06-13 LAB — HEPATITIS C ANTIBODY: Hep C Virus Ab: <0.1 s/co ratio (ref 0.0–0.9)

## 2017-06-17 ENCOUNTER — Encounter: Payer: Self-pay | Admitting: Family Medicine

## 2017-07-01 ENCOUNTER — Other Ambulatory Visit: Payer: Medicare Other

## 2017-07-05 ENCOUNTER — Other Ambulatory Visit: Payer: Self-pay | Admitting: Family Medicine

## 2017-07-06 ENCOUNTER — Telehealth: Payer: Self-pay | Admitting: Family Medicine

## 2017-07-06 NOTE — Telephone Encounter (Signed)
Pt states his insurance company will be sending a form and that will need to be completed.  Pt is requesting a call back to discuss this form.  SW#[phone]/[id number]

## 2017-07-06 NOTE — Telephone Encounter (Signed)
FYI  Spoke with patient. Patient states that we will be getting a PA request or form in regards to Cialis. He has been on this since 2014. He takes this for BPH not ED. He has tried Flomax and Finasteride in the past which were not effective. Urologist put patient on this treatment originally. We have had to fill out PA for Cialis for the patent every year. He just wanted to make sure we were aware why he was taking it. I have made myself a note also as a reminder just in case. Just waiting to get the paper work needed to be filled out.- Kris Mouton, RMA

## 2017-07-07 NOTE — Telephone Encounter (Signed)
thx

## 2017-07-15 ENCOUNTER — Encounter: Payer: Self-pay | Admitting: Internal Medicine

## 2017-07-15 ENCOUNTER — Ambulatory Visit (INDEPENDENT_AMBULATORY_CARE_PROVIDER_SITE_OTHER): Payer: Medicare Other | Admitting: Internal Medicine

## 2017-07-15 VITALS — BP 154/71 | HR 57 | Ht 72.0 in | Wt 157.5 lb

## 2017-07-15 DIAGNOSIS — R9431 Abnormal electrocardiogram [ECG] [EKG]: Secondary | ICD-10-CM | POA: Diagnosis not present

## 2017-07-15 DIAGNOSIS — I700 Atherosclerosis of aorta: Secondary | ICD-10-CM | POA: Diagnosis not present

## 2017-07-15 DIAGNOSIS — R002 Palpitations: Secondary | ICD-10-CM | POA: Diagnosis not present

## 2017-07-15 DIAGNOSIS — R0600 Dyspnea, unspecified: Secondary | ICD-10-CM

## 2017-07-15 DIAGNOSIS — I779 Disorder of arteries and arterioles, unspecified: Secondary | ICD-10-CM | POA: Diagnosis not present

## 2017-07-15 DIAGNOSIS — R0609 Other forms of dyspnea: Secondary | ICD-10-CM | POA: Diagnosis not present

## 2017-07-15 DIAGNOSIS — I739 Peripheral vascular disease, unspecified: Secondary | ICD-10-CM

## 2017-07-15 DIAGNOSIS — R011 Cardiac murmur, unspecified: Secondary | ICD-10-CM | POA: Diagnosis not present

## 2017-07-15 DIAGNOSIS — I10 Essential (primary) hypertension: Secondary | ICD-10-CM | POA: Diagnosis not present

## 2017-07-15 NOTE — Progress Notes (Signed)
New Outpatient Visit Date: 07/15/2017  Referring Provider: Jerrol Banana., MD [address] [place], [place] [address]  Chief Complaint: Aortic atherosclerosis  HPI:  [name] is a 71 y.o. male who is being seen today for the evaluation of aortic atherosclerosis at the request of Dr. [name]. He has a history of hypertension, mild carotid artery disease, and aortic atherosclerosis. Today, Daniel Owens reports feeling relatively well. He has not had any chest pain but notes mild exertional dyspnea when climbing stairs or doing other vigorous activities. He has also noted that his heart feels a bit irregular at times. About 10 years ago, he was told that he may have atrial fibrillation, though further workup and treatment were not pursued. His last episode of palpitations occurred about a month ago. Episodes typically lasts less than a minute and do not have any accompanying symptoms.  Daniel Owens was evaluated by Dr. Clayborn Bigness around 2010. He does not believe that any significant abnormalities were seen at that time. He notes some intermittent leg swelling, left greater than right. He denies orthopnea and PND.  Daniel Owens recently increased his metoprolol due to suboptimally controlled blood pressures. He notes that his recent home blood pressures have been 120-134/60-70. He has noted that the blood pressure sometimes seems to be higher in the right arm compared to the left.  --------------------------------------------------------------------------------------------------  Cardiovascular History & Procedures: Cardiovascular Problems:  Dyspnea on exertion  Palpitations (question history of paroxysmal atrial fibrillation)  Risk Factors:  Hypertension, aortic atherosclerosis, male gender, tobacco use at age greater than 54  Cath/PCI:  None  CV Surgery:  None  EP Procedures and Devices:  None  Non-Invasive Evaluation(s):  Carotid Doppler (08/18/14): Mild carotid  atherosclerosis with <50% narrowing in both internal carotid arteries.  Recent CV Pertinent Labs: Lab Results  Component Value Date   CHOL 149 05/20/2017   HDL 84 05/20/2017   LDLCALC 44 05/20/2017   TRIG 103 05/20/2017   CHOLHDL 1.8 05/20/2017   K 4.6 05/20/2017   BUN 18 05/20/2017   CREATININE 0.81 05/20/2017    --------------------------------------------------------------------------------------------------  Past Medical History:  Diagnosis Date  . Hypertension     Past Surgical History:  Procedure Laterality Date  . APPENDECTOMY    . BILATERAL KNEE ARTHROSCOPY    . HERNIA REPAIR    . NECK SURGERY     involving disc  . SHOULDER SURGERY     Right-x 3 overall  . TENDON REPAIR     involving Tricep tendon    Current Meds  Medication Sig  . Ascorbic Acid (VITAMIN C) 1000 MG tablet Take 1,000 mg by mouth daily.  . B Complex Vitamins (VITAMIN B COMPLEX PO) Take 1 tablet by mouth daily.  . beta carotene 25000 UNIT capsule Take 25,000 Units by mouth daily.  . celecoxib (CELEBREX) 200 MG capsule TAKE 1 CAPSULE BY MOUTH EVERY DAY  . cholecalciferol (VITAMIN D) 1000 UNITS tablet Take 1,000 Units by mouth daily.  . fish oil-omega-3 fatty acids 1000 MG capsule Take 1 g by mouth daily.  Marland Kitchen GLUCOSAMINE-CHONDROIT-VIT C-MN PO Take by mouth daily.   . metoprolol succinate (TOPROL-XL) 50 MG 24 hr tablet Take with or immediately following a meal.  . Multiple Vitamin (MULTIVITAMIN WITH MINERALS) TABS tablet Take 1 tablet by mouth daily.  Marland Kitchen oxyCODONE (ROXICODONE) 15 MG immediate release tablet Take 1 tablet (15 mg total) by mouth every 8 (eight) hours as needed for pain.  . rosuvastatin (CRESTOR) 5 MG tablet  Take 1 tablet (5 mg total) by mouth daily.  . Selenium 200 MCG TABS Take 200 mcg by mouth daily.  . tadalafil (CIALIS) 5 MG tablet ! Table daily for BPH, not ED    Allergies: Patient has no known allergies.  Social History   Social History  . Marital status: Married     Spouse name: N/A  . Number of children: N/A  . Years of education: N/A   Occupational History  . Not on file.   Social History Main Topics  . Smoking status: Former Smoker    Quit date: 10/19/1977  . Smokeless tobacco: Current User    Types: Chew  . Alcohol use 0.0 oz/week     Comment: very seldom- wine  . Drug use: No  . Sexual activity: Not on file   Other Topics Concern  . Not on file   Social History Narrative  . No narrative on file    Family History  Problem Relation Age of Onset  . Stroke Mother   . Uterine cancer Mother   . Throat cancer Father   . Allergic rhinitis Brother   . Prostate cancer Maternal Uncle   . Prostate cancer Paternal Uncle     Review of Systems: A 12-system review of systems was performed and was negative except as noted in the HPI.  --------------------------------------------------------------------------------------------------  Physical Exam: BP (!) 154/71 (BP Location: Right Arm, Patient Position: Sitting, Cuff Size: Normal)   Pulse (!) 57   Ht 6' (1.829 m)   Wt 157 lb 8 oz (71.4 kg)   BMI 21.36 kg/m   General:  Well-developed, well-nourished man, seated, in the exam room. HEENT: No conjunctival pallor or scleral icterus. Moist mucous membranes. OP clear. Neck: Supple without lymphadenopathy, thyromegaly, JVD, or HJR. No carotid bruit. Lungs: Normal work of breathing. Clear to auscultation bilaterally without wheezes or crackles. Heart: Regular rate and rhythm with 2/6 holosystolic murmur heard loudest at the apex. No rubs or gallops. Nondisplaced PMI. Abd: Bowel sounds present. Soft, NT/ND without hepatosplenomegaly Ext: No lower extremity edema. Radial, PT, and DP pulses are 2+ bilaterally Skin: Warm and dry without rash. Neuro: CNIII-XII intact. Strength and fine-touch sensation intact in upper and lower extremities bilaterally. Psych: Normal mood and affect.  EKG:  Sinus bradycardia (heart rate 57 bpm) with poor R-wave  progression in V1 and V2, which could represent lead placement versus prior septal infarct.  Lab Results  Component Value Date   WBC 5.2 05/20/2017   HGB 13.4 05/20/2017   HCT 40.3 05/20/2017   MCV 103 (H) 05/20/2017   PLT 188 05/20/2017    Lab Results  Component Value Date   NA 141 05/20/2017   K 4.6 05/20/2017   CL 101 05/20/2017   CO2 25 05/20/2017   BUN 18 05/20/2017   CREATININE 0.81 05/20/2017   GLUCOSE 85 05/20/2017   ALT 12 05/20/2017    Lab Results  Component Value Date   CHOL 149 05/20/2017   HDL 84 05/20/2017   LDLCALC 44 05/20/2017   TRIG 103 05/20/2017   CHOLHDL 1.8 05/20/2017   --------------------------------------------------------------------------------------------------  ASSESSMENT AND PLAN: Dyspnea on exertion, heart murmur, and abnormal EKG Mild exertional dyspnea is nonspecific. Daniel Owens does not have any chest pain to suggest coronary insufficiency. His EKG demonstrates poor R-wave progression in V1 and V2, which could reflect lead placement, though prior septal MI is a consideration. Soft systolic murmur is also appreciated. We have agreed to obtain an exercise tolerance test and  transthoracic echocardiogram for further evaluation. In the meantime, I have asked Daniel Owens to begin taking aspirin 81 mg daily. He can continue his current dose of metoprolol; I would be hesitant to uptitrate this due to resting heart rate in the 50s.  Palpitations Mr. House was recently told that he may have atrial fibrillation, though he is not on any anticoagulation. He also does not recall any specific testing such as a cardiac monitor and firmness. We will proceed with ETT and TTE, as above. After completion of the studies, we will need to consider placement of a 30 day monitor to evaluate for PAF. If atrial fibrillation is indeed discovered, Daniel Owens would benefit from anticoagulation, given a CHADSVASC score of at least 2.  Carotid artery disease Mild plaquing noted on  carotid Doppler in 2015. Daniel Owens has not had any neurologic symptoms. We will repeat a carotid Doppler to evaluate for progression of his disease. In the meantime, I have asked Daniel Owens to begin taking low-dose aspirin. He should continue his current dose of rosuvastatin, as his LDL is well controlled.  Hypertension Blood pressure is poorly controlled today, though Daniel Owens reports normal readings at home. He suspects there may be some degree of white coat hypertension. I have asked him to continue to monitor his blood pressure at home. If it remains elevated when he presents for follow-up, we will need to consider adding a second agent to metoprolol to appropriately control his blood pressure.  Aortic atherosclerosis Incidentally noted on prior imaging of the chest. We will further assess for significant cerebrovascular and coronary disease, as above. I recommend continuation of aggressive medical therapy, including statin therapy and blood pressure control, as outlined above. I encouraged Daniel Owens to stop using tobacco.  Follow-up: Return to clinic in 1 month.  Nelva Bush, MD 07/15/2017 2:35 PM

## 2017-07-15 NOTE — Patient Instructions (Signed)
Medication Instructions:  Your physician recommends that you continue on your current medications as directed. Please refer to the Current Medication list given to you today.   Labwork: none  Testing/Procedures: Your physician has requested that you have an echocardiogram. Echocardiography is a painless test that uses sound waves to create images of your heart. It provides your doctor with information about the size and shape of your heart and how well your heart's chambers and valves are working. This procedure takes approximately one hour. There are no restrictions for this procedure.  Your physician has requested that you have an exercise tolerance test. For further information please visit [email or web address]. Please also follow instruction sheet, as given.  Your physician has requested that you have a carotid duplex. This test is an ultrasound of the carotid arteries in your neck. It looks at blood flow through these arteries that supply the brain with blood. Allow one hour for this exam. There are no restrictions or special instructions.    Follow-Up: Your physician recommends that you schedule a follow-up appointment in: [facility].   If you need a refill on your cardiac medications before your next appointment, please call your pharmacy.    Echocardiogram An echocardiogram, or echocardiography, uses sound waves (ultrasound) to produce an image of your heart. The echocardiogram is simple, painless, obtained within a short period of time, and offers valuable information to your health care provider. The images from an echocardiogram can provide information such as:  Evidence of coronary artery disease (CAD).  Heart size.  Heart muscle function.  Heart valve function.  Aneurysm detection.  Evidence of a past heart attack.  Fluid buildup around the heart.  Heart muscle thickening.  Assess heart valve function.  Tell a health care provider about:  Any  allergies you have.  All medicines you are taking, including vitamins, herbs, eye drops, creams, and over-the-counter medicines.  Any problems you or family members have had with anesthetic medicines.  Any blood disorders you have.  Any surgeries you have had.  Any medical conditions you have.  Whether you are pregnant or may be pregnant. What happens before the procedure? No special preparation is needed. Eat and drink normally. What happens during the procedure?  In order to produce an image of your heart, gel will be applied to your chest and a wand-like tool (transducer) will be moved over your chest. The gel will help transmit the sound waves from the transducer. The sound waves will harmlessly bounce off your heart to allow the heart images to be captured in real-time motion. These images will then be recorded.  You may need an IV to receive a medicine that improves the quality of the pictures. What happens after the procedure? You may return to your normal schedule including diet, activities, and medicines, unless your health care provider tells you otherwise. This information is not intended to replace advice given to you by your health care provider. Make sure you discuss any questions you have with your health care provider. Document Released: 10/03/2000 Document Revised: 05/24/2016 Document Reviewed: 06/13/2013 Elsevier Interactive Patient Education  2017 [employer].     Exercise Stress Electrocardiogram An exercise stress electrocardiogram is a test to check how blood flows to your heart. It is done to find areas of poor blood flow. You will need to walk on a treadmill for this test. The electrocardiogram will record your heartbeat when you are at rest and when you are exercising. What  happens before the procedure?  Do not have drinks with caffeine or foods with caffeine for 24 hours before the test, or as told by your doctor. This includes coffee, tea (even decaf  tea), sodas, chocolate, and cocoa.  Follow your doctor's instructions about eating and drinking before the test.  Ask your doctor what medicines you should or should not take before the test. Take your medicines with water unless told by your doctor not to.  If you use an inhaler, bring it with you to the test.  Bring a snack to eat after the test.  Do not  smoke for 4 hours before the test.  Do not put lotions, powders, creams, or oils on your chest before the test.  Wear comfortable shoes and clothing. What happens during the procedure?  You will have patches put on your chest. Small areas of your chest may need to be shaved. Wires will be connected to the patches.  Your heart rate will be watched while you are resting and while you are exercising.  You will walk on the treadmill. The treadmill will slowly get faster to raise your heart rate.  The test will take about 1-2 hours. What happens after the procedure?  Your heart rate and blood pressure will be watched after the test.  You may return to your normal diet, activities, and medicines or as told by your doctor. This information is not intended to replace advice given to you by your health care provider. Make sure you discuss any questions you have with your health care provider. Document Released: 03/24/2008 Document Revised: 06/04/2016 Document Reviewed: 06/13/2013 Elsevier Interactive Patient Education  [employer].

## 2017-07-17 ENCOUNTER — Encounter: Payer: Self-pay | Admitting: Internal Medicine

## 2017-07-17 DIAGNOSIS — R0609 Other forms of dyspnea: Principal | ICD-10-CM

## 2017-07-17 DIAGNOSIS — I739 Peripheral vascular disease, unspecified: Secondary | ICD-10-CM

## 2017-07-17 DIAGNOSIS — I779 Disorder of arteries and arterioles, unspecified: Secondary | ICD-10-CM | POA: Insufficient documentation

## 2017-07-17 DIAGNOSIS — R011 Cardiac murmur, unspecified: Secondary | ICD-10-CM | POA: Insufficient documentation

## 2017-07-17 DIAGNOSIS — R0600 Dyspnea, unspecified: Secondary | ICD-10-CM | POA: Insufficient documentation

## 2017-07-17 DIAGNOSIS — R9431 Abnormal electrocardiogram [ECG] [EKG]: Secondary | ICD-10-CM | POA: Insufficient documentation

## 2017-07-17 MED ORDER — ASPIRIN EC 81 MG PO TBEC: 81.0000 mg | DELAYED_RELEASE_TABLET | Freq: Every day | ORAL | 3 refills | Status: DC

## 2017-07-22 NOTE — Telephone Encounter (Signed)
Visit completed.

## 2017-07-24 ENCOUNTER — Telehealth: Payer: Self-pay | Admitting: Nurse Practitioner

## 2017-07-24 ENCOUNTER — Telehealth: Payer: Self-pay | Admitting: Cardiology

## 2017-07-24 NOTE — Telephone Encounter (Signed)
Left voicemail message with instructions and time of stress test scheduled on Monday with instructions to call back if any questions.

## 2017-07-24 NOTE — Telephone Encounter (Signed)
Returned page to Mr. Kean. He wanted to know if he should take his metoprolol the morning of his stress test coming up on 07/27/17. I advised him to hold his metoprolol that day. Also reminded him to remain NPO after midnight the night prior. He verbalized understanding.   Arbutus Leas 7:12 PM

## 2017-07-27 ENCOUNTER — Ambulatory Visit (INDEPENDENT_AMBULATORY_CARE_PROVIDER_SITE_OTHER): Payer: Medicare Other

## 2017-07-27 DIAGNOSIS — R0600 Dyspnea, unspecified: Secondary | ICD-10-CM

## 2017-07-27 DIAGNOSIS — R0609 Other forms of dyspnea: Secondary | ICD-10-CM

## 2017-07-27 DIAGNOSIS — R011 Cardiac murmur, unspecified: Secondary | ICD-10-CM

## 2017-07-28 LAB — EXERCISE TOLERANCE TEST
Estimated workload: 12.1 METS
Exercise duration (min): 10 min
Exercise duration (sec): 15 s
MPHR: 150 {beats}/min
Peak HR: 133 {beats}/min
Percent HR: 88 %
Rest HR: 59 {beats}/min

## 2017-07-29 DIAGNOSIS — M4180 Other forms of scoliosis, site unspecified: Secondary | ICD-10-CM | POA: Insufficient documentation

## 2017-07-29 DIAGNOSIS — S22000A Wedge compression fracture of unspecified thoracic vertebra, initial encounter for closed fracture: Secondary | ICD-10-CM | POA: Diagnosis not present

## 2017-07-29 DIAGNOSIS — M4150 Other secondary scoliosis, site unspecified: Secondary | ICD-10-CM | POA: Insufficient documentation

## 2017-07-29 DIAGNOSIS — M48062 Spinal stenosis, lumbar region with neurogenic claudication: Secondary | ICD-10-CM | POA: Diagnosis not present

## 2017-07-30 ENCOUNTER — Ambulatory Visit (INDEPENDENT_AMBULATORY_CARE_PROVIDER_SITE_OTHER): Payer: Medicare Other | Admitting: Family Medicine

## 2017-07-30 VITALS — BP 120/46 | HR 64 | Temp 98.5°F | Resp 14 | Wt 160.8 lb

## 2017-07-30 DIAGNOSIS — I10 Essential (primary) hypertension: Secondary | ICD-10-CM | POA: Diagnosis not present

## 2017-07-30 DIAGNOSIS — G8929 Other chronic pain: Secondary | ICD-10-CM | POA: Diagnosis not present

## 2017-07-30 DIAGNOSIS — Z23 Encounter for immunization: Secondary | ICD-10-CM | POA: Diagnosis not present

## 2017-07-30 DIAGNOSIS — Z1211 Encounter for screening for malignant neoplasm of colon: Secondary | ICD-10-CM | POA: Diagnosis not present

## 2017-07-30 DIAGNOSIS — R351 Nocturia: Secondary | ICD-10-CM | POA: Diagnosis not present

## 2017-07-30 DIAGNOSIS — G629 Polyneuropathy, unspecified: Secondary | ICD-10-CM | POA: Diagnosis not present

## 2017-07-30 DIAGNOSIS — S22069A Unspecified fracture of T7-T8 vertebra, initial encounter for closed fracture: Secondary | ICD-10-CM | POA: Diagnosis not present

## 2017-07-30 DIAGNOSIS — N401 Enlarged prostate with lower urinary tract symptoms: Secondary | ICD-10-CM

## 2017-07-30 LAB — IFOBT (OCCULT BLOOD): IFOBT: NEGATIVE

## 2017-07-30 MED ORDER — OXYCODONE HCL 15 MG PO TABS: 15.0000 mg | ORAL_TABLET | Freq: Three times a day (TID) | ORAL | 0 refills | Status: DC | PRN

## 2017-07-30 MED ORDER — B-12 1000 MCG PO CAPS: ORAL_CAPSULE | ORAL | 0 refills | Status: DC

## 2017-07-30 MED ORDER — OXYCODONE HCL 15 MG PO TABS: 15.0000 mg | ORAL_TABLET | Freq: Four times a day (QID) | ORAL | 0 refills | Status: DC | PRN

## 2017-07-30 MED ORDER — FOLIC ACID 400 MCG PO TABS
400.0000 ug | ORAL_TABLET | Freq: Every day | ORAL | 0 refills | Status: AC
Start: 2017-07-30 — End: ?

## 2017-07-30 NOTE — Progress Notes (Signed)
Patient: Daniel Owens, Male    DOB: 27-Apr-1946, 71 y.o.   MRN: [id number] Visit Date: 07/30/2017  Today's Provider: [name], MD   Chief Complaint  Patient presents with  . Annual Exam  . Hypertension  . Pain   Subjective:  Daniel Owens is a 71 y.o. male who presents today for physical exam. Patient saw [facility] for Wellness visit on 06/12/17. Patient states he has noticed that his blood pressure has been running high the past few times it has been checked.  BP Readings from Last 3 Encounters:  07/30/17 (!) 120/46  07/15/17 (!) 154/71  05/20/17 120/60    Last colonoscopy was 12/24/12 showed gastritis mucus, otherwise normal. Dr. Comer Locket. Routine lab work was done in August 2018. 6CIT Screen 06/12/2017  What Year? 0 points  What month? 0 points  What time? 0 points  Count back from 20 0 points  Months in reverse 0 points  Repeat phrase 2 points  Total Score 2    Fall Risk  06/12/2017 06/10/2016 06/05/2015  Falls in the past year? Yes Yes No  Number falls in past yr: 1 1 -  Injury with Fall? Yes No -  Comment 2 broken vertebreas  - -  Follow up Falls prevention discussed - -     Review of Systems  Constitutional: Negative.   HENT: Negative.   Eyes: Negative.   Respiratory: Negative.   Cardiovascular: Positive for leg swelling.  Gastrointestinal: Negative.   Endocrine: Positive for cold intolerance.  Genitourinary: Positive for difficulty urinating.  Musculoskeletal: Positive for arthralgias, back pain, neck pain and neck stiffness.  Skin: Negative.   Allergic/Immunologic: Negative.   Neurological: Negative.   Hematological: Bruises/bleeds easily.  Psychiatric/Behavioral: Negative.     Social History   Social History  . Marital status: Married    Spouse name: N/A  . Number of children: N/A  . Years of education: N/A   Occupational History  . Not on file.   Social History Main Topics  . Smoking status: Former Smoker    Quit date: 10/19/1977  .  Smokeless tobacco: Current User    Types: Chew  . Alcohol use 0.0 oz/week     Comment: very seldom- wine  . Drug use: No  . Sexual activity: Not on file   Other Topics Concern  . Not on file   Social History Narrative  . No narrative on file    Patient Active Problem List   Diagnosis Date Noted  . DOE (dyspnea on exertion) 07/17/2017  . Heart murmur 07/17/2017  . Abnormal EKG 07/17/2017  . Carotid artery disease ([place]) 07/17/2017  . Aortic atherosclerosis ([place]) 05/20/2017  . Hyperlipemia 05/20/2017  . BPH associated with nocturia 08/05/2016  . Esophageal reflux 03/18/2015  . Arthritis 03/18/2015  . Back pain, chronic 03/18/2015  . Cardiac conduction disorder 03/18/2015  . Narrowing of intervertebral disc space 03/18/2015  . Essential hypertension 03/18/2015  . History of anemia 03/18/2015  . History of atrial fibrillation 03/18/2015  . Weak pulse 03/18/2015  . Palpitations 03/18/2015  . Peyronie's disease 03/18/2015  . Raynaud's syndrome 03/18/2015  . Complete rotator cuff rupture of left shoulder 05/12/2013    Past Surgical History:  Procedure Laterality Date  . APPENDECTOMY    . BILATERAL KNEE ARTHROSCOPY    . HERNIA REPAIR    . NECK SURGERY     involving disc  . SHOULDER SURGERY     Right-x 3 overall  . TENDON REPAIR  involving Tricep tendon    His family history includes Allergic rhinitis in his brother; Heart attack in his maternal grandfather and maternal grandmother; Prostate cancer in his maternal uncle and paternal uncle; Stroke in his mother; Throat cancer in his father; Uterine cancer in his mother.     Outpatient Encounter Prescriptions as of 07/30/2017  Medication Sig Note  . Ascorbic Acid (VITAMIN C) 1000 MG tablet Take 1,000 mg by mouth daily.   Marland Kitchen aspirin EC 81 MG tablet Take 1 tablet (81 mg total) by mouth daily.   . B Complex Vitamins (VITAMIN B COMPLEX PO) Take 1 tablet by mouth daily.   . beta carotene 25000 UNIT capsule Take 25,000 Units  by mouth daily.   . celecoxib (CELEBREX) 200 MG capsule TAKE 1 CAPSULE BY MOUTH EVERY DAY   . cholecalciferol (VITAMIN D) 1000 UNITS tablet Take 1,000 Units by mouth daily.   . fish oil-omega-3 fatty acids 1000 MG capsule Take 1 g by mouth daily.   Marland Kitchen GLUCOSAMINE-CHONDROIT-VIT C-MN PO Take by mouth daily.  03/18/2015: OTC Received from: [facility]  . metoprolol succinate (TOPROL-XL) 50 MG 24 hr tablet Take with or immediately following a meal.   . Multiple Vitamin (MULTIVITAMIN WITH MINERALS) TABS tablet Take 1 tablet by mouth daily.   Marland Kitchen oxyCODONE (ROXICODONE) 15 MG immediate release tablet Take 1 tablet (15 mg total) by mouth every 8 (eight) hours as needed for pain.   . rosuvastatin (CRESTOR) 5 MG tablet Take 1 tablet (5 mg total) by mouth daily.   . Selenium 200 MCG TABS Take 200 mcg by mouth daily.   . tadalafil (CIALIS) 5 MG tablet ! Table daily for BPH, not ED    No facility-administered encounter medications on file as of 07/30/2017.     Patient Care Team: Jerrol Banana., MD as PCP - General (Family Medicine) Anell Barr, OD as Consulting Physician (Optometry) Kristeen Miss, MD as Consulting Physician (Neurosurgery)      Objective:   Vitals:  Vitals:   07/30/17 1053  BP: (!) 120/46  Pulse: 64  Resp: 14  Temp: 98.5 F (36.9 C)  Weight: 160 lb 12.8 oz (72.9 kg)    Physical Exam  Constitutional: He is oriented to person, place, and time. He appears well-developed and well-nourished.  HENT:  Head: Normocephalic and atraumatic.  Right Ear: External ear normal.  Left Ear: External ear normal.  Mouth/Throat: Oropharynx is clear and moist.  Eyes: Pupils are equal, round, and reactive to light. Conjunctivae are normal.  Neck: Normal range of motion. Neck supple.  Cardiovascular: Normal rate, regular rhythm, normal heart sounds and intact distal pulses.  Exam reveals no gallop.   No murmur heard. Pulmonary/Chest: Effort normal and breath sounds  normal. No respiratory distress. He has no wheezes.  Abdominal: Soft. He exhibits no distension. There is no tenderness.  Genitourinary: Rectum normal, prostate normal and penis normal. Rectal exam shows guaiac negative stool. No penile tenderness.  Neurological: He is alert and oriented to person, place, and time.  Skin: No rash noted.  Psychiatric: He has a normal mood and affect. His behavior is normal. Judgment and thought content normal.   Depression Screen PHQ 2/9 Scores 06/12/2017 06/10/2016 06/05/2015  PHQ - 2 Score 0 0 0   Assessment & Plan:    1. Essential (primary) hypertension Stable.  2. Other chronic pain Refills x 3 given. - oxyCODONE (ROXICODONE) 15 MG immediate release tablet; Take 1 tablet (15 mg total) by  mouth every 8 (eight) hours as needed for pain.  Dispense: 120 tablet; Refill: 0 - oxyCODONE (ROXICODONE) 15 MG immediate release tablet; Take 1 tablet (15 mg total) by mouth every 8 (eight) hours as needed for pain.  Dispense: 120 tablet; Refill: 0  3. BPH associated with nocturia 4. Neuropathy Patient advised to try OTC I10 and Folic acid. - Cyanocobalamin (B-12) 1000 MCG CAPS; 1 capsule once daily  Dispense: 30 capsule; Refill: 0 - folic acid (FOLVITE) 301 MCG tablet; Take 1 tablet (400 mcg total) by mouth daily.  Dispense: 30 tablet; Refill: 0  5. Need for immunization against influenza - Flu vaccine HIGH DOSE PF (Fluzone High dose)  7. Colon cancer screening - IFOBT POC (occult bld, rslt in office); Future  HPI, Exam and A&P transcribed by Tiffany Kocher, RMA under direction and in the presence of Miguel Aschoff, MD. I have done the exam and reviewed the chart and it is accurate to the best of my knowledge. Development worker, community has been used and  any errors in dictation or transcription are unintentional. Miguel Aschoff M.D. [facility]

## 2017-07-30 NOTE — Patient Instructions (Signed)
For the burning/numbness sensation try over the counter B12 1000 mg 1 daily and Folic Acid 893 mg 1 daily.

## 2017-08-03 ENCOUNTER — Telehealth: Payer: Self-pay | Admitting: Internal Medicine

## 2017-08-03 ENCOUNTER — Ambulatory Visit (INDEPENDENT_AMBULATORY_CARE_PROVIDER_SITE_OTHER): Payer: Medicare Other

## 2017-08-03 ENCOUNTER — Other Ambulatory Visit: Payer: Self-pay

## 2017-08-03 ENCOUNTER — Telehealth: Payer: Self-pay | Admitting: Pulmonary Disease

## 2017-08-03 DIAGNOSIS — R011 Cardiac murmur, unspecified: Secondary | ICD-10-CM | POA: Diagnosis not present

## 2017-08-03 DIAGNOSIS — I779 Disorder of arteries and arterioles, unspecified: Secondary | ICD-10-CM | POA: Diagnosis not present

## 2017-08-03 DIAGNOSIS — R0609 Other forms of dyspnea: Secondary | ICD-10-CM

## 2017-08-03 DIAGNOSIS — R0600 Dyspnea, unspecified: Secondary | ICD-10-CM

## 2017-08-03 DIAGNOSIS — I739 Peripheral vascular disease, unspecified: Principal | ICD-10-CM

## 2017-08-03 NOTE — Telephone Encounter (Signed)
Patient returning call.

## 2017-08-03 NOTE — Telephone Encounter (Signed)
Patient wants to be seen sooner than 11/6 fu testing.   Patient aware he will be added to the waitlist   He was told stress test was abnormal and doesn't want to wait

## 2017-08-03 NOTE — Telephone Encounter (Signed)
Error

## 2017-08-03 NOTE — Telephone Encounter (Signed)
Wife states patient still in the office having ultrasound, will try again later.

## 2017-08-03 NOTE — Telephone Encounter (Signed)
S/w patient. Patient is not having any chest pain or symptoms at this time. He wanted to check and see if there had been any cancellations. At this time we do not. Assured patient he is on the waiting list. He was agreeable to plan and would await results of echo from today.

## 2017-08-04 ENCOUNTER — Encounter: Payer: Self-pay | Admitting: Family Medicine

## 2017-08-05 ENCOUNTER — Telehealth: Payer: Self-pay | Admitting: Internal Medicine

## 2017-08-05 LAB — VAS US CAROTID
LEFT ECA DIAS: -10 cm/s
LEFT VERTEBRAL DIAS: -16 cm/s
Left CCA dist dias: -21 cm/s
Left CCA dist sys: -93 cm/s
Left CCA prox dias: 14 cm/s
Left CCA prox sys: 144 cm/s
Left ICA dist dias: -21 cm/s
Left ICA dist sys: -88 cm/s
Left ICA prox dias: -19 cm/s
Left ICA prox sys: -95 cm/s
RIGHT ECA DIAS: 0 cm/s
RIGHT VERTEBRAL DIAS: 0 cm/s
Right CCA prox dias: 12 cm/s
Right CCA prox sys: 103 cm/s
Right cca dist sys: -118 cm/s

## 2017-08-05 NOTE — Telephone Encounter (Signed)
Pt added 10/23, 3:40pm. S/w wife who is agreeable

## 2017-08-05 NOTE — Telephone Encounter (Signed)
Discussed results of echo (normal) and recent exercise tolerance test that showed ST depressions at peak exercise and hypertensive blood pressure response. Mr. Scarlata does not report any symptoms but several times asks about further blood pressure monitoring. I recommend that he return to see [place] in the office next week to address his abnormal stress test and blood pressure.  Nelva Bush, MD [facility] Pager: [phone]

## 2017-08-11 ENCOUNTER — Ambulatory Visit (INDEPENDENT_AMBULATORY_CARE_PROVIDER_SITE_OTHER): Payer: Medicare Other | Admitting: Nurse Practitioner

## 2017-08-11 ENCOUNTER — Encounter: Payer: Self-pay | Admitting: Nurse Practitioner

## 2017-08-11 VITALS — BP 150/60 | HR 56 | Ht 72.0 in | Wt 157.5 lb

## 2017-08-11 DIAGNOSIS — R9439 Abnormal result of other cardiovascular function study: Secondary | ICD-10-CM

## 2017-08-11 DIAGNOSIS — I10 Essential (primary) hypertension: Secondary | ICD-10-CM | POA: Diagnosis not present

## 2017-08-11 NOTE — Progress Notes (Addendum)
Office Visit    Patient Name: Daniel Owens Date of Encounter: 08/11/2017  Primary Care Provider:  Jerrol Banana., MD Primary Cardiologist:  Andree Coss, MD   Chief Complaint    71 year old ? with a history of hypertension, palpitations, systolic murmur, mild carotid disease, and finding of aortic atherosclerosis, who presents for follow-up after recent echo and stress test.  Past Medical History    Past Medical History:  Diagnosis Date  . Abnormal stress test    a. 07/2017 ETT: Ex 10:15, no chest pain, HTN response, 31mm ST dep in II, II, aVF, V4-V6 - returned to baseline in 5 mins-->Intermediate risk study.  . Aortic atherosclerosis ([place])    a. Incidentally noted on Chest CT in 2016.  . Carotid arterial disease ([place])    a. 07/2014 carotid u/s: mild <50%, bilat ICA stenosis.  . Hypertension   . Palpitations    a. ? prior dx of PAF.  Marland Kitchen Systolic murmur    a. Reports h/o rheumatic fever as a child;  b. 07/2017 Echo: EF 55-60%, no rwma, triv AI, nl RV fxn.   Past Surgical History:  Procedure Laterality Date  . APPENDECTOMY    . BILATERAL KNEE ARTHROSCOPY    . HERNIA REPAIR    . NECK SURGERY     involving disc  . SHOULDER SURGERY     Right-x 3 overall  . TENDON REPAIR     involving Tricep tendon    Allergies  No Known Allergies  History of Present Illness    71 year old male with the above past medical history including hypertension, mild carotid artery plaquing, palpitations, and systolic murmur.  He believes that at one point, he was diagnosed with paroxysmal atrial fibrillation, though it does not appear there was ever any workup and he has never been on anticoagulation.  He was recently evaluated in clinic in September by Dr. Saunders Revel in the setting of finding of aortic atherosclerosis on prior CT of the chest and more recently chest x-ray in April 2018.  When evaluated in September, he reported feeling well but did report mild exertional dyspnea when climbing stairs  or doing other vigorous activities.  ECG showed poor R wave progression.  As a result, echocardiogram was arranged and showed normal LV function without any significant valvular disease.  An exercise treadmill test was also arranged and though he showed good exercise tolerance, walking 10 minutes and 15 seconds, he was noted to have 2 mm inferolateral ST segment depression in the absence of chest pain.  He did have a hypertensive response to exercise.  Study was felt to be intermediate risk for coronary disease and follow-up was arranged.  Since his last visit and stress test, he reports feeling well.  He denies chest pain and also goes on to deny any prior history of dyspnea on exertion.  He thinks we must have misunderstood him the last time he was here.  He remains active without limitations other than those created by severe back pain for which he uses oxycodone.  He denies any recent palpitations.  He is interested in additional testing given intermediate stress test but has many questions and is not necessarily sure which way to proceed.  We discussed his stress test results at length as well as options for additional testing including nuclear stress testing, cardiac CT angiography with fractional flow reserve, and diagnostic catheterization.  We agreed that in the absence of significant symptoms, that catheterization would be reserved for  the case of markedly abnormal noninvasive testing.  We also discussed his blood pressure which is elevated today.  It has been running in the high 130s at home.  At this time, he does not wish to start an additional agent.  He denies PND, orthopnea, dizziness, syncope, edema, or early satiety.  Home Medications    Prior to Admission medications   Medication Sig Start Date End Date Taking? Authorizing Provider  Ascorbic Acid (VITAMIN C) 1000 MG tablet Take 1,000 mg by mouth daily.   Yes [provider]  aspirin EC 81 MG tablet Take 1 tablet (81 mg total)  by mouth daily. 07/17/17  Yes End, Harrell Gave, MD  B Complex Vitamins (VITAMIN B COMPLEX PO) Take 1 tablet by mouth daily.   Yes [provider]  beta carotene 25000 UNIT capsule Take 25,000 Units by mouth daily.   Yes [provider]  celecoxib (CELEBREX) 200 MG capsule TAKE 1 CAPSULE BY MOUTH EVERY DAY 07/06/17  Yes Jerrol Banana., MD  cholecalciferol (VITAMIN D) 1000 UNITS tablet Take 1,000 Units by mouth daily.   Yes [provider]  Cyanocobalamin (B-12) 1000 MCG CAPS 1 capsule once daily 07/30/17  Yes Jerrol Banana., MD  fish oil-omega-3 fatty acids 1000 MG capsule Take 1 g by mouth daily.   Yes [provider]  folic acid (FOLVITE) 350 MCG tablet Take 1 tablet (400 mcg total) by mouth daily. 07/30/17  Yes Jerrol Banana., MD  GLUCOSAMINE-CHONDROIT-VIT C-MN PO Take by mouth daily.  01/16/15  Yes [provider]  metoprolol succinate (TOPROL-XL) 50 MG 24 hr tablet Take with or immediately following a meal. 12/22/16  Yes Jerrol Banana., MD  Multiple Vitamin (MULTIVITAMIN WITH MINERALS) TABS tablet Take 1 tablet by mouth daily.   Yes [provider]  oxyCODONE (ROXICODONE) 15 MG immediate release tablet Take 1 tablet (15 mg total) by mouth every 6 (six) hours as needed for pain. 07/30/17  Yes Jerrol Banana., MD  rosuvastatin (CRESTOR) 5 MG tablet Take 1 tablet (5 mg total) by mouth daily. 05/21/17  Yes Jerrol Banana., MD  Selenium 200 MCG TABS Take 200 mcg by mouth daily.   Yes [provider]  tadalafil (CIALIS) 5 MG tablet ! Table daily for BPH, not ED 01/01/17  Yes Jerrol Banana., MD    Review of Systems    He denies chest pain, palpitations, dyspnea, pnd, orthopnea, n, v, dizziness, syncope, edema, weight gain, or early satiety.  All other systems reviewed and are otherwise negative except as noted above.  Physical Exam    VS:  BP (!) 150/60 (BP Location: Left Arm, Patient  Position: Sitting, Cuff Size: Normal)   Pulse (!) 56   Ht 6' (1.829 m)   Wt 157 lb 8 oz (71.4 kg)   BMI 21.36 kg/m  , BMI Body mass index is 21.36 kg/m. GEN: Well nourished, well developed, in no acute distress.  HEENT: normal.  Neck: Supple, no JVD, carotid bruits, or masses. Cardiac: RRR, 2/6 systolic murmur at the apex, no rubs, or gallops. No clubbing, cyanosis, edema.  Radials/DP/PT 2+ and equal bilaterally.  Respiratory:  Respirations regular and unlabored, clear to auscultation bilaterally. GI: Soft, nontender, nondistended, BS + x 4. MS: no deformity or atrophy. Skin: warm and dry, no rash. Neuro:  Strength and sensation are intact. Psych: Normal affect.  Accessory Clinical Findings    Echocardiogram and exercise treadmill testing results  discussed at length and updated in the past medical history above.  Assessment & Plan    1.  Abnormal stress test: Patient was recently evaluated for an incidental finding of aortic atherosclerosis on chest x-ray and prior CT scan.  He reported dyspnea at higher levels of activity and subsequently underwent echocardiography which showed normal LV function without valvular abnormalities, and stress testing which showed 2 mm inferolateral ST segment depression at peak exercise.  Patient showed very good exercise tolerance, walking 10 minutes and 15 seconds without chest pain.  He did have a hypertensive response to exercise.  Given intermediate risk findings on stress testing, additional noninvasive testing has been recommended to further clarify his cardiac risk.  We discussed options for evaluation including doing nothing, nuclear stress testing, CT angiography with fractional flow reserve, and diagnostic catheterization.  As he does well at home without any significant symptoms or limitations, diagnostic catheterization would be reserved only in the case where he had markedly abnormal noninvasive testing.  In that setting, we talked for 40 minutes  about  the risks and benefits of cardiac CT versus stress testing.  He would like to pursue cardiac CTA and we will ask our prior authorization team and schedulers to look into the feasibility of this for him.  If he is willing to proceed with scheduling of CTA, we will need to arrange for a basic metabolic panel prior to this taking place.  He is already on a beta-blocker and his heart rate is well controlled.  He otherwise remains on aspirin and statin therapy.    2.  Essential hypertension: Blood pressure is elevated at 150/60 today.  His cuff pressures at home have been in the high 130s-140s.  I did recommend that we initiate low-dose losartan therapy in addition to his current dose of metoprolol.  He is not sure he wants to start another medicine and therefore we will continue to follow his blood pressures at home, twice a day, and contact us with results.  3.  Hyperlipidemia: On Crestor.  LDL was 44 in August.  HDL was 84.  4.  PAF:  ? H/o PAF.  H/o palpitations but he says that he was told it was PAF in the past.  No recent palpitations.  Plan for event monitoring @ some point to further include/exclude Afib - can look into this following testing described above.  CHA2DS2VASc @ least 2.  5.  Disposition: We will look into the feasibility and cost of scheduling cardiac CTA and contact patient.  If he chooses to proceed, we will plan on a basic metabolic panel prior to testing.  Previous creatinine was 0.81 on August 1.  We will arrange follow-up once it is clear what additional testing he will have and when he will have it.  Murray Hodgkins, NP 08/11/2017, 5:02 PM

## 2017-08-11 NOTE — Patient Instructions (Signed)
Medication Instructions:  Your physician recommends that you continue on your current medications as directed. Please refer to the Current Medication list given to you today.    Labwork: Your physician recommends that you return for lab work WITHIN 30 DAYS OF CT (BMP).   Testing/Procedures: Please arrive at the [facility] main entrance of [facility] at xx:xx AM (30-45 minutes prior to test start time)  [facility],  [id number] [phone]  Proceed to the [facility] Radiology Department (First Floor).  Please follow these instructions carefully (unless otherwise directed):  Hold all erectile dysfunction medications at least 48 hours prior to test.  On the Night Before the Test: . Drink plenty of water. . Do not consume any caffeinated/decaffeinated beverages or chocolate 12 hours prior to your test. . Do not take any antihistamines 12 hours prior to your test. . If you take Metformin do not take 24 hours prior to test. . If the patient has contrast allergy: ? Patient will need a prescription for Prednisone and very clear instructions (as follows): 1. Prednisone 50 mg - take 13 hours prior to test 2. Take another Prednisone 50 mg 7 hours prior to test 3. Take another Prednisone 50 mg 1 hour prior to test 4. Take Benadryl 50 mg 1 hour prior to test . Patient must complete all four doses of above prophylactic medications. . Patient will need a ride after test due to Benadryl.  On the Day of the Test: . Drink plenty of water. Do not drink any water within one hour of the test. . Do not eat any food 4 hours prior to the test. . You may take your regular medications prior to the test. . IF NOT ON A BETA BLOCKER - Take 50 mg of lopressor (metoprolol) one hour before the test. . HOLD Furosemide morning of the test.  After the Test: . Drink plenty of water. . After receiving IV contrast, you may experience a mild flushed  feeling. This is normal. . On occasion, you may experience a mild rash up to 24 hours after the test. This is not dangerous. If this occurs, you can take Benadryl 25 mg and increase your fluid intake. . If you experience trouble breathing, this can be serious. If it is severe call 911 IMMEDIATELY. If it is mild, please call our office. . If you take any of these medications: Glipizide/Metformin, Avandament, Glucavance, please do not take 48 hours after completing test.   Follow-Up: Your physician recommends that you schedule a follow-up appointment in: AFTER CT.   If you need a refill on your cardiac medications before your next appointment, please call your pharmacy.

## 2017-08-12 ENCOUNTER — Telehealth: Payer: Self-pay | Admitting: Nurse Practitioner

## 2017-08-12 NOTE — Telephone Encounter (Signed)
Pt calling stating he was seen yesterday  During the appt Gerald Stabs motioned another BP medication to help control it  He is wanting to research it but can't remember the name of the medication   Please advise

## 2017-08-12 NOTE — Telephone Encounter (Signed)
Called patient. Daniel Bayley, NP had mentioned in his office visit note the possible addition of Losartan; however, patient declined at that time. Patient given the name of the medication so he can do his own research. He was also given the number, [phone], to call [facility] to ask about how much his Cardiac CTA would cost and instructions to then call insurance company to see how much they would cover. Patient verbalized understanding.

## 2017-08-19 ENCOUNTER — Telehealth: Payer: Self-pay | Admitting: *Deleted

## 2017-08-19 ENCOUNTER — Other Ambulatory Visit: Payer: Self-pay | Admitting: *Deleted

## 2017-08-19 DIAGNOSIS — Z01810 Encounter for preprocedural cardiovascular examination: Secondary | ICD-10-CM

## 2017-08-19 DIAGNOSIS — I700 Atherosclerosis of aorta: Secondary | ICD-10-CM

## 2017-08-19 DIAGNOSIS — I10 Essential (primary) hypertension: Secondary | ICD-10-CM

## 2017-08-19 NOTE — Telephone Encounter (Signed)
Patient is having a a Coronary CT on 09/03/17. He will need BMP prior to CT. Patient is requesting to have blood work drawn at Dr Alben Spittle office, his PCP. Called Dr Alben Spittle office and they are able to do lab work there. They need an order given to patient to bring on day he comes for labs. No appointment needed. Lab order entered and printed to give to patient. Will place lab slip at front desk.   No answer when called patient. Left message to call back.

## 2017-08-19 NOTE — Telephone Encounter (Signed)
Called patient back. He does not have to have at Dr Alben Spittle just wanted to be able to have lab work in [place]. Patient verbalized understanding to go to [facility] this week or next week for BMP. Patient out of town and will go as soon as he is back.

## 2017-08-19 NOTE — Telephone Encounter (Signed)
Pt retuning our call He states he's out of town  He was on the train last time we called  Would like a call back when free

## 2017-08-21 ENCOUNTER — Telehealth: Payer: Self-pay | Admitting: Internal Medicine

## 2017-08-21 ENCOUNTER — Other Ambulatory Visit
Admission: RE | Admit: 2017-08-21 | Discharge: 2017-08-21 | Disposition: A | Discharge status: Home / Self Care | Payer: Medicare Other | Source: Ambulatory Visit | Attending: Nurse Practitioner | Admitting: Nurse Practitioner

## 2017-08-21 DIAGNOSIS — I10 Essential (primary) hypertension: Secondary | ICD-10-CM

## 2017-08-21 DIAGNOSIS — Z01810 Encounter for preprocedural cardiovascular examination: Secondary | ICD-10-CM

## 2017-08-21 DIAGNOSIS — I700 Atherosclerosis of aorta: Secondary | ICD-10-CM | POA: Insufficient documentation

## 2017-08-21 LAB — BASIC METABOLIC PANEL
Anion gap: 5 (ref 5–15)
BUN: 18 mg/dL (ref 6–20)
CO2: 27 mmol/L (ref 22–32)
Calcium: 9.2 mg/dL (ref 8.9–10.3)
Chloride: 105 mmol/L (ref 101–111)
Creatinine, Ser: 0.78 mg/dL (ref 0.61–1.24)
GFR calc Af Amer: >60 mL/min (ref >60)
GFR calc non Af Amer: >60 mL/min (ref >60)
Glucose, Bld: 99 mg/dL (ref 65–99)
Potassium: 4.5 mmol/L (ref 3.5–5.1)
Sodium: 137 mmol/L (ref 135–145)

## 2017-08-21 MED ORDER — LOSARTAN POTASSIUM 25 MG PO TABS: 25.0000 mg | ORAL_TABLET | Freq: Every day | ORAL | 6 refills | Status: DC

## 2017-08-21 NOTE — Telephone Encounter (Signed)
Pt was told by Dr. Saunders Revel and Sharolyn Douglas that if his BP remains high he may want to start taking losartan Patient states his BP has remained high so he wants to begin the medication Please call to discuss

## 2017-08-21 NOTE — Telephone Encounter (Signed)
Spoke with patient and he states that since monitoring his blood pressures have remain elevated. He is currently headed to get labs done and does not have all of his readings with him. This AM it was 155/77 and last night it was 168/74. He states that per his discussion with Dr. Saunders Revel and Ignacia Bayley NP that if this continued they would try a low dose of losartan 25 mg once daily and he thinks that he should probably try this. Will route to both providers for orders.

## 2017-08-21 NOTE — Telephone Encounter (Signed)
His BMP looks normal today. I think it is reasonable to start losartan 25 mg daily and recheck a BMP in ~2 weeks. He should let us know if his blood pressure remains >140/90 in 2 weeks.  Nelva Bush, MD [facility] Pager: [phone]

## 2017-08-21 NOTE — Telephone Encounter (Signed)
Spoke with patient and reviewed Dr. Darnelle Bos recommendations. Reviewed results today were normal, script sent in for losartan 25 mg once daily, and then recheck labs in 2 weeks. Instructed him to continue monitoring blood pressures and call if they continue to persistently stay greater than 140/90. He was agreeable with plan, verbalized understanding, and had no further questions at this time.

## 2017-08-25 ENCOUNTER — Ambulatory Visit: Payer: Medicare Other | Admitting: Nurse Practitioner

## 2017-09-02 ENCOUNTER — Other Ambulatory Visit
Admission: RE | Admit: 2017-09-02 | Discharge: 2017-09-02 | Disposition: A | Discharge status: Home / Self Care | Payer: Medicare Other | Source: Ambulatory Visit | Attending: Internal Medicine | Admitting: Internal Medicine

## 2017-09-02 DIAGNOSIS — I10 Essential (primary) hypertension: Secondary | ICD-10-CM | POA: Insufficient documentation

## 2017-09-02 LAB — BASIC METABOLIC PANEL
Anion gap: 9 (ref 5–15)
BUN: 23 mg/dL — ABNORMAL HIGH (ref 6–20)
CO2: 28 mmol/L (ref 22–32)
Calcium: 9.3 mg/dL (ref 8.9–10.3)
Chloride: 100 mmol/L — ABNORMAL LOW (ref 101–111)
Creatinine, Ser: 0.96 mg/dL (ref 0.61–1.24)
GFR calc Af Amer: >60 mL/min (ref >60)
GFR calc non Af Amer: >60 mL/min (ref >60)
Glucose, Bld: 94 mg/dL (ref 65–99)
Potassium: 4.8 mmol/L (ref 3.5–5.1)
Sodium: 137 mmol/L (ref 135–145)

## 2017-09-03 ENCOUNTER — Telehealth: Payer: Self-pay | Admitting: Internal Medicine

## 2017-09-03 ENCOUNTER — Ambulatory Visit (HOSPITAL_COMMUNITY)
Admission: RE | Admit: 2017-09-03 | Discharge: 2017-09-03 | Disposition: A | Discharge status: Home / Self Care | Payer: Medicare Other | Source: Ambulatory Visit | Attending: Nurse Practitioner | Admitting: Nurse Practitioner

## 2017-09-03 DIAGNOSIS — R079 Chest pain, unspecified: Secondary | ICD-10-CM | POA: Diagnosis not present

## 2017-09-03 DIAGNOSIS — Z79899 Other long term (current) drug therapy: Secondary | ICD-10-CM

## 2017-09-03 DIAGNOSIS — R9439 Abnormal result of other cardiovascular function study: Secondary | ICD-10-CM | POA: Diagnosis not present

## 2017-09-03 DIAGNOSIS — I700 Atherosclerosis of aorta: Secondary | ICD-10-CM | POA: Insufficient documentation

## 2017-09-03 DIAGNOSIS — I10 Essential (primary) hypertension: Secondary | ICD-10-CM

## 2017-09-03 MED ORDER — LOSARTAN POTASSIUM 50 MG PO TABS: 50.0000 mg | ORAL_TABLET | Freq: Every day | ORAL | 3 refills | Status: DC

## 2017-09-03 MED ORDER — NITROGLYCERIN 0.4 MG SL SUBL
0.8000 mg | SUBLINGUAL_TABLET | Freq: Once | SUBLINGUAL | Status: AC
  Administered 2017-09-03: 0.8 mg via SUBLINGUAL

## 2017-09-03 MED ORDER — METOPROLOL SUCCINATE ER 50 MG PO TB24: ORAL_TABLET | ORAL | 3 refills | Status: DC

## 2017-09-03 MED ORDER — NITROGLYCERIN 0.4 MG SL SUBL
SUBLINGUAL_TABLET | SUBLINGUAL | Status: AC
  Filled 2017-09-03: qty 2

## 2017-09-03 NOTE — Telephone Encounter (Signed)
Called patient. He verbalized understanding of Coronary CT results.  He also wanted to update Dr End on his BP/HR. 11/9 AM 148/61, 55  PM 142/61 11/10 AM 131/65, 50  PM 118/56 11/11 AM 141/67, 53  PM 129/61 11/12 AM 143/72, 56  PM 138/64 11/13 AM 152/70,   PM 136/64 11/14 AM 139/67, 53  PM 132/60 11/15 AM 128/55, 53  Patient states HR is always in the 50's. He takes his losartan and metoprolol at the same time in the morning with breakfast. AM blood pressures are before taking morning meds. PM blood pressures are before bedtime. Patient denies any symptoms of any kind. Advised I will route to Dr End for review and advice on when patient needs follow.

## 2017-09-03 NOTE — Telephone Encounter (Signed)
Called patient. He verbalized understanding of medication changes and to get lab work at [facility] in 1-2 weeks. Rx's sent to pharmacy.

## 2017-09-03 NOTE — Telephone Encounter (Signed)
It appears that his blood pressures are typically upper normal to mildly elevated with heart rates less than 60.  I suggest that he cut his metoprolol in half and double his losartan.  We should recheck a basic metabolic panel in 1-2 weeks to ensure stable renal function.

## 2017-09-03 NOTE — Telephone Encounter (Signed)
Patient wants to talk about results for ct he had today not sure if he needs to be seen

## 2017-09-03 NOTE — Progress Notes (Signed)
CT scan completed. Tolerated well. D/C home with wife, awake and alert. In no distress.

## 2017-09-04 ENCOUNTER — Telehealth: Payer: Self-pay | Admitting: Internal Medicine

## 2017-09-04 NOTE — Telephone Encounter (Signed)
Though his coronary arteries are free of stenosis and calcification, he has documented aortic atherosclerosis and at least mild plaquing of the carotid arteries. Unless he is experiencing side effects from rosuvastatin, I suggest that he continue taking rosuvastatin to prevent of his peripheral vascular disease.  Nelva Bush, MD [facility] Pager: [phone]

## 2017-09-04 NOTE — Telephone Encounter (Signed)
Patient stated his CT calcium score was 0 and wonders does he still need to take the Rosuvastatin. Last lipid panel was 05/20/17. He says he will take it if he needs to but was questioning whether he needed to. Stated what started all this was when he had a scan that showed he may have plaque in his aorta but now his Coronary CT was normal.  Advised patient I would route to Dr End for advice.

## 2017-09-04 NOTE — Telephone Encounter (Signed)
Patient verbalized understanding and will continue rosuvastatin.

## 2017-09-04 NOTE — Telephone Encounter (Signed)
Pt calling stating since he was told that his CT was good  Does he still need to be on Rosuvastatin 5 mg   Please advise.

## 2017-09-11 ENCOUNTER — Other Ambulatory Visit
Admission: RE | Admit: 2017-09-11 | Discharge: 2017-09-11 | Disposition: A | Discharge status: Home / Self Care | Payer: Medicare Other | Source: Ambulatory Visit | Attending: Internal Medicine | Admitting: Internal Medicine

## 2017-09-11 DIAGNOSIS — I10 Essential (primary) hypertension: Secondary | ICD-10-CM

## 2017-09-11 DIAGNOSIS — Z79899 Other long term (current) drug therapy: Secondary | ICD-10-CM | POA: Diagnosis not present

## 2017-09-11 LAB — BASIC METABOLIC PANEL
Anion gap: 6 (ref 5–15)
BUN: 24 mg/dL — ABNORMAL HIGH (ref 6–20)
CO2: 27 mmol/L (ref 22–32)
Calcium: 8.9 mg/dL (ref 8.9–10.3)
Chloride: 102 mmol/L (ref 101–111)
Creatinine, Ser: 0.83 mg/dL (ref 0.61–1.24)
GFR calc Af Amer: >60 mL/min (ref >60)
GFR calc non Af Amer: >60 mL/min (ref >60)
Glucose, Bld: 100 mg/dL — ABNORMAL HIGH (ref 65–99)
Potassium: 4.4 mmol/L (ref 3.5–5.1)
Sodium: 135 mmol/L (ref 135–145)

## 2017-09-14 ENCOUNTER — Other Ambulatory Visit: Payer: Self-pay | Admitting: Neurological Surgery

## 2017-09-14 ENCOUNTER — Telehealth: Payer: Self-pay | Admitting: *Deleted

## 2017-09-14 DIAGNOSIS — M48062 Spinal stenosis, lumbar region with neurogenic claudication: Secondary | ICD-10-CM

## 2017-09-14 NOTE — Telephone Encounter (Signed)
BP still a bit up, especially in the AM. He should continue to monitor and let us know if the readings are consistent above 140/90. If that is the case, we will need to consider increasing losartan.  Nelva Bush, MD [facility] Pager: [phone]

## 2017-09-14 NOTE — Telephone Encounter (Signed)
S/w patient and he verbalized understanding.

## 2017-09-14 NOTE — Telephone Encounter (Signed)
Results called to pt. Pt verbalized understanding. We discussed his most recent blood pressures. Morning blood pressures were taken prior to taking losartan and metoprolol which he takes in the morning.  11/21 AM 136/65, 67 11/22 AM 127/64, 58  PM 122/62, 55 11/23 AM 138/65, 63  PM 142/64, 59 11/24 AM 144/65, 61  PM 135/62, 56 11/25 AM 144/64, 60  PM 129/55, 62 11/26 AM 161/80, 63  Patient has no other complaints at this time.  Advised that his blood pressures were stable and to take his morning blood pressure about 2 hours after taking morning medications for the next week and to call me back with the readings if they continue to be greater than 140/90.  He verbalized understanding.

## 2017-09-14 NOTE — Telephone Encounter (Signed)
-----   Message from Nelva Bush, MD sent at 09/13/2017  7:08 AM EST ----- Please let Mr. Deere know that his kidney function and electrolytes are normal. He should continue his current medications and let us know if his BP remains above 140/90.

## 2017-10-01 ENCOUNTER — Telehealth: Payer: Self-pay | Admitting: Internal Medicine

## 2017-10-01 NOTE — Telephone Encounter (Signed)
S/w patient. He reported the blood pressure in previous entry of this encounter. He is currently taking Losartan 50 mg every morning and metoprolol succinate 25 mg every morning. Patient denies any symptoms of chest pain, shortness of breath, dizziness, or palpitations. He wanted to make sure these readings were fine. Advised patient to continue medications and let us know if BP remains consistantly greater than 140/90 per Dr End from previous telephone encounter. Advised ok to monitor BP once a day unless BP significantly changes. I offered f/u appointment to discuss any further questions about CT or BP. Patient denied at this time and verbalized he knew his CT calcium score was zero.

## 2017-10-01 NOTE — Telephone Encounter (Signed)
Pt calling in his BP readings  09/14/17  AM 146/69 HR 64 PM 134/68 HR 58 09/15/17  AM 153/67 HR 54 PM 129/64 HR 53 09/16/17 AM 139/70 HR 69 PM 137/64 HR 53 09/17/17 AM 117/57 HR 70 PM 138/66 HR 68 09/18/17  AM 115/56 HR 65 PM 117/58 HR 55 09/19/17  AM 139/66 HR 53 PM 125/60 HR 59 09/20/17  AM 140/63 HR 60 PM 119/64 HR 58  Please advise. He is not sure if he is to take the medication or change the dose based on these readings

## 2017-11-05 DIAGNOSIS — D692 Other nonthrombocytopenic purpura: Secondary | ICD-10-CM | POA: Diagnosis not present

## 2017-11-05 DIAGNOSIS — D0462 Carcinoma in situ of skin of left upper limb, including shoulder: Secondary | ICD-10-CM | POA: Diagnosis not present

## 2017-11-05 DIAGNOSIS — D225 Melanocytic nevi of trunk: Secondary | ICD-10-CM | POA: Diagnosis not present

## 2017-11-05 DIAGNOSIS — L570 Actinic keratosis: Secondary | ICD-10-CM | POA: Diagnosis not present

## 2017-11-05 DIAGNOSIS — D485 Neoplasm of uncertain behavior of skin: Secondary | ICD-10-CM | POA: Diagnosis not present

## 2017-11-05 DIAGNOSIS — C44619 Basal cell carcinoma of skin of left upper limb, including shoulder: Secondary | ICD-10-CM | POA: Diagnosis not present

## 2017-11-05 DIAGNOSIS — X32XXXA Exposure to sunlight, initial encounter: Secondary | ICD-10-CM | POA: Diagnosis not present

## 2017-11-05 DIAGNOSIS — D2272 Melanocytic nevi of left lower limb, including hip: Secondary | ICD-10-CM | POA: Diagnosis not present

## 2017-11-05 DIAGNOSIS — D2261 Melanocytic nevi of right upper limb, including shoulder: Secondary | ICD-10-CM | POA: Diagnosis not present

## 2017-11-10 ENCOUNTER — Ambulatory Visit (INDEPENDENT_AMBULATORY_CARE_PROVIDER_SITE_OTHER): Payer: Medicare Other | Admitting: Family Medicine

## 2017-11-10 VITALS — BP 120/44 | HR 76 | Temp 98.9°F | Resp 16 | Wt 161.4 lb

## 2017-11-10 DIAGNOSIS — G8929 Other chronic pain: Secondary | ICD-10-CM | POA: Diagnosis not present

## 2017-11-10 DIAGNOSIS — R351 Nocturia: Secondary | ICD-10-CM | POA: Diagnosis not present

## 2017-11-10 DIAGNOSIS — N401 Enlarged prostate with lower urinary tract symptoms: Secondary | ICD-10-CM

## 2017-11-10 DIAGNOSIS — I7300 Raynaud's syndrome without gangrene: Secondary | ICD-10-CM

## 2017-11-10 DIAGNOSIS — E7800 Pure hypercholesterolemia, unspecified: Secondary | ICD-10-CM | POA: Diagnosis not present

## 2017-11-10 DIAGNOSIS — I10 Essential (primary) hypertension: Secondary | ICD-10-CM | POA: Diagnosis not present

## 2017-11-10 MED ORDER — OXYCODONE HCL 15 MG PO TABS: 15.0000 mg | ORAL_TABLET | Freq: Four times a day (QID) | ORAL | 0 refills | Status: DC | PRN

## 2017-11-10 MED ORDER — AMLODIPINE BESYLATE 5 MG PO TABS: 5.0000 mg | ORAL_TABLET | Freq: Every day | ORAL | 3 refills | Status: DC

## 2017-11-10 NOTE — Progress Notes (Signed)
Daniel Owens  MRN: [id number] DOB: 07/30/1946  Subjective:  HPI  Patient is here for routine follow up. LOV was on 07/30/17.  Chronic back pain: patient needs refill on Oxycodone.  Hypertension; patient has been checking his b/p sometimes not as often as usual. Readings around 140s/60s-70. No cardiac symptoms. Wt Readings from Last 3 Encounters:  11/10/17 161 lb 6.4 oz (73.2 kg)  08/11/17 157 lb 8 oz (71.4 kg)  07/30/17 160 lb 12.8 oz (72.9 kg)   BP Readings from Last 3 Encounters:  11/10/17 (!) 120/44  09/03/17 115/68  08/11/17 (!) 150/60   Patient did want to talk about his hands and feet freezing all the time especially hands. He said he was told before that he had Raynauds syndrome. He wonders if he can do anything to help that. Routine lab work was done on 05/20/17.  Patient Active Problem List   Diagnosis Date Noted  . DOE (dyspnea on exertion) 07/17/2017  . Heart murmur 07/17/2017  . Abnormal EKG 07/17/2017  . Carotid artery disease ([place]) 07/17/2017  . Aortic atherosclerosis ([place]) 05/20/2017  . Hyperlipemia 05/20/2017  . BPH associated with nocturia 08/05/2016  . Esophageal reflux 03/18/2015  . Arthritis 03/18/2015  . Back pain, chronic 03/18/2015  . Cardiac conduction disorder 03/18/2015  . Narrowing of intervertebral disc space 03/18/2015  . Essential hypertension 03/18/2015  . History of anemia 03/18/2015  . History of atrial fibrillation 03/18/2015  . Weak pulse 03/18/2015  . Palpitations 03/18/2015  . Peyronie's disease 03/18/2015  . Raynaud's syndrome 03/18/2015  . Complete rotator cuff rupture of left shoulder 05/12/2013    Past Medical History:  Diagnosis Date  . Abnormal stress test    a. 07/2017 ETT: Ex 10:15, no chest pain, HTN response, 97mm ST dep in II, II, aVF, V4-V6 - returned to baseline in 5 mins-->Intermediate risk study.  . Aortic atherosclerosis ([place])    a. Incidentally noted on Chest CT in 2016.  . Carotid arterial disease ([place])    a.  07/2014 carotid u/s: mild <50%, bilat ICA stenosis.  . Hypertension   . Palpitations    a. ? prior dx of PAF.  Marland Kitchen Systolic murmur    a. Reports h/o rheumatic fever as a child;  b. 07/2017 Echo: EF 55-60%, no rwma, triv AI, nl RV fxn.    Social History   Socioeconomic History  . Marital status: Married    Spouse name: Not on file  . Number of children: Not on file  . Years of education: Not on file  . Highest education level: Not on file  Social Needs  . Financial resource strain: Not on file  . Food insecurity - worry: Not on file  . Food insecurity - inability: Not on file  . Transportation needs - medical: Not on file  . Transportation needs - non-medical: Not on file  Occupational History  . Not on file  Tobacco Use  . Smoking status: Former Smoker    Last attempt to quit: 10/19/1977    Years since quitting: 40.0  . Smokeless tobacco: Current User    Types: Chew  Substance and Sexual Activity  . Alcohol use: Yes    Alcohol/week: 0.0 oz    Comment: very seldom- wine  . Drug use: No  . Sexual activity: Not on file  Other Topics Concern  . Not on file  Social History Narrative  . Not on file    Outpatient Encounter Medications as of 11/10/2017  Medication Sig  Note  . Ascorbic Acid (VITAMIN C) 1000 MG tablet Take 1,000 mg by mouth daily.   Marland Kitchen aspirin EC 81 MG tablet Take 1 tablet (81 mg total) by mouth daily.   . B Complex Vitamins (VITAMIN B COMPLEX PO) Take 1 tablet by mouth daily.   . beta carotene 25000 UNIT capsule Take 25,000 Units by mouth daily.   . celecoxib (CELEBREX) 200 MG capsule TAKE 1 CAPSULE BY MOUTH EVERY DAY   . cholecalciferol (VITAMIN D) 1000 UNITS tablet Take 1,000 Units by mouth daily.   . Cyanocobalamin (B-12) 1000 MCG CAPS 1 capsule once daily   . fish oil-omega-3 fatty acids 1000 MG capsule Take 1 g by mouth daily.   . folic acid (FOLVITE) 270 MCG tablet Take 1 tablet (400 mcg total) by mouth daily.   Marland Kitchen GLUCOSAMINE-CHONDROIT-VIT C-MN PO Take  by mouth daily.  03/18/2015: OTC Received from: [facility]  . losartan (COZAAR) 50 MG tablet Take 1 tablet (50 mg total) daily by mouth.   . metoprolol succinate (TOPROL-XL) 50 MG 24 hr tablet Take 1/2 tablet (25 mg) by mouth once a day. Take with or immediately following a meal.   . Multiple Vitamin (MULTIVITAMIN WITH MINERALS) TABS tablet Take 1 tablet by mouth daily.   Marland Kitchen oxyCODONE (ROXICODONE) 15 MG immediate release tablet Take 1 tablet (15 mg total) by mouth every 6 (six) hours as needed for pain.   . rosuvastatin (CRESTOR) 5 MG tablet Take 1 tablet (5 mg total) by mouth daily.   . Selenium 200 MCG TABS Take 200 mcg by mouth daily.   . tadalafil (CIALIS) 5 MG tablet ! Table daily for BPH, not ED    No facility-administered encounter medications on file as of 11/10/2017.     No Known Allergies  Review of Systems  Constitutional: Negative.   Respiratory: Negative.   Cardiovascular: Negative.        Pt c/o Raynauds of finger tips  Musculoskeletal: Positive for back pain and joint pain.  Endo/Heme/Allergies: Negative.        Cold intolerance  Psychiatric/Behavioral: Negative.     Objective:  BP (!) 120/44   Pulse 76   Temp 98.9 F (37.2 C)   Resp 16   Wt 161 lb 6.4 oz (73.2 kg)   BMI 21.89 kg/m   Physical Exam  Assessment and Plan :  1. Other chronic pain Stable. Refills X 3.  - oxyCODONE (ROXICODONE) 15 MG immediate release tablet; Take 1 tablet (15 mg total) by mouth every 6 (six) hours as needed for pain.  Dispense: 120 tablet; Refill: 0  2. Essential (primary) hypertension A little on the low side today. Switch from Losartan to Amlodipine. Also Amlodipine should help with Raynauds syndrome also. Follow up in 1 month. - amLODipine (NORVASC) 5 MG tablet; Take 1 tablet (5 mg total) by mouth daily.  Dispense: 90 tablet; Refill: 3  3. BPH associated with nocturia Stable.  4. Pure hypercholesterolemia 5. Raynaud's disease without gangrene Amlodipine  could help with this. Re check in 1 month - amLODipine (NORVASC) 5 MG tablet; Take 1 tablet (5 mg total) by mouth daily.  Dispense: 90 tablet; Refill: 3 6.Chronic Back Pain/DDD  HPI, Exam and A&P transcribed by Tiffany Kocher, RMA under direction and in the presence of Miguel Aschoff, MD. I have done the exam and reviewed the chart and it is accurate to the best of my knowledge. Development worker, community has been used and  any errors in dictation  or transcription are unintentional. Miguel Aschoff M.D. [facility]

## 2017-12-09 ENCOUNTER — Ambulatory Visit (INDEPENDENT_AMBULATORY_CARE_PROVIDER_SITE_OTHER): Payer: Medicare Other | Admitting: Family Medicine

## 2017-12-09 VITALS — BP 128/60 | HR 58 | Temp 98.1°F | Resp 16 | Wt 162.0 lb

## 2017-12-09 DIAGNOSIS — L03211 Cellulitis of face: Secondary | ICD-10-CM | POA: Diagnosis not present

## 2017-12-09 MED ORDER — DOXYCYCLINE HYCLATE 100 MG PO TABS: 100.0000 mg | ORAL_TABLET | Freq: Two times a day (BID) | ORAL | 1 refills | Status: DC

## 2017-12-09 NOTE — Progress Notes (Signed)
Daniel Owens  MRN: [id number] DOB: [birth year]  Subjective:  HPI   The patient is a 72 year old male who presents for evaluation of redness and soreness of is nose.  He states that over the lat 2-3 weeks he has had a pimple on the inside of his nose.  He reports that he sterilized a needle and then punctured it into the pimple.  He said both times it had pus in it that drained and then got better. Then 3-4 days ago he developed redness and soreness on the outer tip of his nose and it is more sore on the right side. It is of note that the pimple both times were on the right.  Patient Active Problem List   Diagnosis Date Noted  . DOE (dyspnea on exertion) 07/17/2017  . Heart murmur 07/17/2017  . Abnormal EKG 07/17/2017  . Carotid artery disease ([place]) 07/17/2017  . Aortic atherosclerosis ([place]) 05/20/2017  . Hyperlipemia 05/20/2017  . BPH associated with nocturia 08/05/2016  . Esophageal reflux 03/18/2015  . Arthritis 03/18/2015  . Back pain, chronic 03/18/2015  . Cardiac conduction disorder 03/18/2015  . Narrowing of intervertebral disc space 03/18/2015  . Essential hypertension 03/18/2015  . History of anemia 03/18/2015  . History of atrial fibrillation 03/18/2015  . Weak pulse 03/18/2015  . Palpitations 03/18/2015  . Peyronie's disease 03/18/2015  . Raynaud's syndrome 03/18/2015  . Complete rotator cuff rupture of left shoulder 05/12/2013    Past Medical History:  Diagnosis Date  . Abnormal stress test    a. 07/2017 ETT: Ex 10:15, no chest pain, HTN response, 103mm ST dep in II, II, aVF, V4-V6 - returned to baseline in 5 mins-->Intermediate risk study.  . Aortic atherosclerosis ([place])    a. Incidentally noted on Chest CT in 2016.  . Carotid arterial disease ([place])    a. 07/2014 carotid u/s: mild <50%, bilat ICA stenosis.  . Hypertension   . Palpitations    a. ? prior dx of PAF.  Marland Kitchen Systolic murmur    a. Reports h/o rheumatic fever as a child;  b. 07/2017 Echo: EF 55-60%, no  rwma, triv AI, nl RV fxn.    Social History   Socioeconomic History  . Marital status: Married    Spouse name: Not on file  . Number of children: Not on file  . Years of education: Not on file  . Highest education level: Not on file  Social Needs  . Financial resource strain: Not on file  . Food insecurity - worry: Not on file  . Food insecurity - inability: Not on file  . Transportation needs - medical: Not on file  . Transportation needs - non-medical: Not on file  Occupational History  . Not on file  Tobacco Use  . Smoking status: Former Smoker    Last attempt to quit: 10/19/1977    Years since quitting: 40.1  . Smokeless tobacco: Current User    Types: Chew  Substance and Sexual Activity  . Alcohol use: Yes    Alcohol/week: 0.0 oz    Comment: very seldom- wine  . Drug use: No  . Sexual activity: Not on file  Other Topics Concern  . Not on file  Social History Narrative  . Not on file    Outpatient Encounter Medications as of 12/09/2017  Medication Sig Note  . amLODipine (NORVASC) 5 MG tablet Take 1 tablet (5 mg total) by mouth daily.   . Ascorbic Acid (VITAMIN C) 1000 MG  tablet Take 1,000 mg by mouth daily.   Marland Kitchen aspirin EC 81 MG tablet Take 1 tablet (81 mg total) by mouth daily.   . B Complex Vitamins (VITAMIN B COMPLEX PO) Take 1 tablet by mouth daily.   . beta carotene 25000 UNIT capsule Take 25,000 Units by mouth daily.   . celecoxib (CELEBREX) 200 MG capsule TAKE 1 CAPSULE BY MOUTH EVERY DAY   . cholecalciferol (VITAMIN D) 1000 UNITS tablet Take 1,000 Units by mouth daily.   . Cyanocobalamin (B-12) 1000 MCG CAPS 1 capsule once daily   . fish oil-omega-3 fatty acids 1000 MG capsule Take 1 g by mouth daily.   . folic acid (FOLVITE) 782 MCG tablet Take 1 tablet (400 mcg total) by mouth daily.   Marland Kitchen GLUCOSAMINE-CHONDROIT-VIT C-MN PO Take by mouth daily.  03/18/2015: OTC Received from: [facility]  . metoprolol succinate (TOPROL-XL) 50 MG 24 hr tablet  Take 1/2 tablet (25 mg) by mouth once a day. Take with or immediately following a meal.   . Multiple Vitamin (MULTIVITAMIN WITH MINERALS) TABS tablet Take 1 tablet by mouth daily.   Marland Kitchen oxyCODONE (ROXICODONE) 15 MG immediate release tablet Take 1 tablet (15 mg total) by mouth every 6 (six) hours as needed for pain.   . rosuvastatin (CRESTOR) 5 MG tablet Take 1 tablet (5 mg total) by mouth daily.   . Selenium 200 MCG TABS Take 200 mcg by mouth daily.   . tadalafil (CIALIS) 5 MG tablet ! Table daily for BPH, not ED    No facility-administered encounter medications on file as of 12/09/2017.     No Known Allergies  Review of Systems  Constitutional: Negative for fever.  Eyes: Negative.   Respiratory: Negative for cough and shortness of breath.   Cardiovascular: Negative for chest pain, palpitations and leg swelling.  Gastrointestinal: Negative.   Musculoskeletal: Positive for back pain and joint pain.  Skin: Negative for itching and rash.  Endo/Heme/Allergies: Negative.   Psychiatric/Behavioral: Negative.     Objective:  BP 128/60 (BP Location: Right Arm, Patient Position: Sitting, Cuff Size: Normal)   Pulse (!) 58   Temp 98.1 F (36.7 C) (Oral)   Resp 16   Wt 162 lb (73.5 kg)   BMI 21.97 kg/m   Physical Exam  Constitutional: He is well-developed, well-nourished, and in no distress.  HENT:  Head: Normocephalic and atraumatic.  Right Ear: External ear normal.  Left Ear: External ear normal.  Tip of nose erythematous and tender.  Eyes: Conjunctivae are normal. No scleral icterus.  Cardiovascular: Normal rate, regular rhythm and normal heart sounds.  Pulmonary/Chest: Effort normal and breath sounds normal.  Lymphadenopathy:    He has no cervical adenopathy.  Skin: There is erythema.    Assessment and Plan :  1. Cellulitis of face/tip of nose  - doxycycline (VIBRA-TABS) 100 MG tablet; Take 1 tablet (100 mg total) by mouth 2 (two) times daily.  Dispense: 20 tablet; Refill:  1  I have done the exam and reviewed the chart and it is accurate to the best of my knowledge. Development worker, community has been used and  any errors in dictation or transcription are unintentional. Miguel Aschoff M.D. [facility]

## 2017-12-10 DIAGNOSIS — D0462 Carcinoma in situ of skin of left upper limb, including shoulder: Secondary | ICD-10-CM | POA: Diagnosis not present

## 2017-12-10 DIAGNOSIS — C44629 Squamous cell carcinoma of skin of left upper limb, including shoulder: Secondary | ICD-10-CM | POA: Diagnosis not present

## 2017-12-14 ENCOUNTER — Ambulatory Visit: Payer: Medicare Other | Admitting: Family Medicine

## 2017-12-16 ENCOUNTER — Ambulatory Visit (INDEPENDENT_AMBULATORY_CARE_PROVIDER_SITE_OTHER): Payer: Medicare Other | Admitting: Family Medicine

## 2017-12-16 ENCOUNTER — Encounter: Payer: Self-pay | Admitting: Family Medicine

## 2017-12-16 DIAGNOSIS — I7300 Raynaud's syndrome without gangrene: Secondary | ICD-10-CM

## 2017-12-16 DIAGNOSIS — I10 Essential (primary) hypertension: Secondary | ICD-10-CM | POA: Diagnosis not present

## 2017-12-16 MED ORDER — AMLODIPINE BESYLATE 10 MG PO TABS: 10.0000 mg | ORAL_TABLET | Freq: Every day | ORAL | 11 refills | Status: DC

## 2017-12-16 NOTE — Progress Notes (Signed)
Patient: Daniel Owens Male    DOB: [birth year]   72 y.o.   MRN: [id number] Visit Date: 12/16/2017  Today's Provider: [name], MD   Chief Complaint  Patient presents with  . Hypertension   Subjective:    HPI  Hypertension, follow-up:  BP Readings from Last 3 Encounters:  12/16/17 (!) 146/62  12/09/17 128/60  11/10/17 (!) 120/44    He was last seen for hypertension 1 months ago.  BP at that visit was 128/360. Management since that visit includes Switch from Losartan to Amlodipine. He reports good compliance with treatment. He is not having side effects.  He is exercising. He is adherent to low salt diet.   Outside blood pressures are 140's/80's. Patient denies chest pain, chest pressure/discomfort, claudication, dyspnea, exertional chest pressure/discomfort, fatigue, irregular heart beat, lower extremity edema, near-syncope, orthopnea, palpitations, paroxysmal nocturnal dyspnea, syncope and tachypnea.    Wt Readings from Last 3 Encounters:  12/16/17 160 lb (72.6 kg)  12/09/17 162 lb (73.5 kg)  11/10/17 161 lb 6.4 oz (73.2 kg)   ------------------------------------------------------------------------  Raynaud's disease-  Pt was started on Amlodipine. Pt reports that his hands still feel cold but it hard to tell this time of year.       No Known Allergies   Current Outpatient Medications:  .  amLODipine (NORVASC) 5 MG tablet, Take 1 tablet (5 mg total) by mouth daily., Disp: 90 tablet, Rfl: 3 .  Ascorbic Acid (VITAMIN C) 1000 MG tablet, Take 1,000 mg by mouth daily., Disp: , Rfl:  .  aspirin EC 81 MG tablet, Take 1 tablet (81 mg total) by mouth daily., Disp: 90 tablet, Rfl: 3 .  B Complex Vitamins (VITAMIN B COMPLEX PO), Take 1 tablet by mouth daily., Disp: , Rfl:  .  beta carotene 25000 UNIT capsule, Take 25,000 Units by mouth daily., Disp: , Rfl:  .  celecoxib (CELEBREX) 200 MG capsule, TAKE 1 CAPSULE BY MOUTH EVERY DAY, Disp: 90 capsule, Rfl: 3 .   cholecalciferol (VITAMIN D) 1000 UNITS tablet, Take 1,000 Units by mouth daily., Disp: , Rfl:  .  Cyanocobalamin (B-12) 1000 MCG CAPS, 1 capsule once daily, Disp: 30 capsule, Rfl: 0 .  doxycycline (VIBRA-TABS) 100 MG tablet, Take 1 tablet (100 mg total) by mouth 2 (two) times daily., Disp: 20 tablet, Rfl: 1 .  fish oil-omega-3 fatty acids 1000 MG capsule, Take 1 g by mouth daily., Disp: , Rfl:  .  folic acid (FOLVITE) 409 MCG tablet, Take 1 tablet (400 mcg total) by mouth daily., Disp: 30 tablet, Rfl: 0 .  GLUCOSAMINE-CHONDROIT-VIT C-MN PO, Take by mouth daily. , Disp: , Rfl:  .  metoprolol succinate (TOPROL-XL) 50 MG 24 hr tablet, Take 1/2 tablet (25 mg) by mouth once a day. Take with or immediately following a meal., Disp: 90 tablet, Rfl: 3 .  Multiple Vitamin (MULTIVITAMIN WITH MINERALS) TABS tablet, Take 1 tablet by mouth daily., Disp: , Rfl:  .  oxyCODONE (ROXICODONE) 15 MG immediate release tablet, Take 1 tablet (15 mg total) by mouth every 6 (six) hours as needed for pain., Disp: 120 tablet, Rfl: 0 .  rosuvastatin (CRESTOR) 5 MG tablet, Take 1 tablet (5 mg total) by mouth daily., Disp: 90 tablet, Rfl: 3 .  Selenium 200 MCG TABS, Take 200 mcg by mouth daily., Disp: , Rfl:  .  tadalafil (CIALIS) 5 MG tablet, ! Table daily for BPH, not ED, Disp: 90 tablet, Rfl: 3  Review  of Systems  Constitutional: Negative.   HENT: Negative.   Eyes: Negative.   Respiratory: Negative.   Cardiovascular: Negative.   Gastrointestinal: Negative.   Endocrine: Negative.   Genitourinary: Negative.   Musculoskeletal: Negative.   Skin: Negative.   Allergic/Immunologic: Negative.   Neurological: Negative.   Hematological: Negative.   Psychiatric/Behavioral: Negative.     Social History   Tobacco Use  . Smoking status: Former Smoker    Last attempt to quit: 10/19/1977    Years since quitting: 40.1  . Smokeless tobacco: Current User    Types: Chew  Substance Use Topics  . Alcohol use: Yes     Alcohol/week: 0.0 oz    Comment: very seldom- wine   Objective:   BP (!) 146/62 (BP Location: Left Arm, Patient Position: Sitting, Cuff Size: Normal)   Pulse 64   Temp 98.8 F (37.1 C) (Oral)   Resp 16   Wt 160 lb (72.6 kg)   SpO2 97%   BMI 21.70 kg/m  Vitals:   12/16/17 1144  BP: (!) 146/62  Pulse: 64  Resp: 16  Temp: 98.8 F (37.1 C)  TempSrc: Oral  SpO2: 97%  Weight: 160 lb (72.6 kg)     Physical Exam  Constitutional: He is oriented to person, place, and time. He appears well-developed and well-nourished.  HENT:  Head: Normocephalic and atraumatic.  Eyes: Conjunctivae are normal.  Neck: No thyromegaly present.  Cardiovascular: Normal rate, regular rhythm and normal heart sounds.  Pulmonary/Chest: Effort normal and breath sounds normal.  Abdominal: Soft.  Neurological: He is alert and oriented to person, place, and time.  Skin: Skin is warm and dry.  Psychiatric: He has a normal mood and affect. His behavior is normal. Judgment and thought content normal.        Assessment & Plan:     1. Essential (primary) hypertension  - amLODipine (NORVASC) 10 MG tablet; Take 1 tablet (10 mg total) by mouth daily.  Dispense: 30 tablet; Refill: 11  2. Raynaud's disease without gangrene Improved some--increase dose. - amLODipine (NORVASC) 10 MG tablet; Take 1 tablet (10 mg total) by mouth daily.  Dispense: 30 tablet; Refill: 11      I have done the exam and reviewed the above chart and it is accurate to the best of my knowledge. Development worker, community has been used in this note in any air is in the dictation or transcription are unintentional.  [name], MD  [place]

## 2017-12-24 DIAGNOSIS — C44619 Basal cell carcinoma of skin of left upper limb, including shoulder: Secondary | ICD-10-CM | POA: Diagnosis not present

## 2017-12-24 DIAGNOSIS — L905 Scar conditions and fibrosis of skin: Secondary | ICD-10-CM | POA: Diagnosis not present

## 2018-01-28 ENCOUNTER — Other Ambulatory Visit: Payer: Self-pay | Admitting: *Deleted

## 2018-01-28 ENCOUNTER — Ambulatory Visit (INDEPENDENT_AMBULATORY_CARE_PROVIDER_SITE_OTHER): Payer: Medicare Other | Admitting: Family Medicine

## 2018-01-28 ENCOUNTER — Encounter: Payer: Self-pay | Admitting: Family Medicine

## 2018-01-28 VITALS — BP 132/58 | HR 62 | Temp 98.5°F | Resp 16 | Wt 159.0 lb

## 2018-01-28 DIAGNOSIS — G8929 Other chronic pain: Secondary | ICD-10-CM

## 2018-01-28 DIAGNOSIS — R351 Nocturia: Secondary | ICD-10-CM

## 2018-01-28 DIAGNOSIS — N3090 Cystitis, unspecified without hematuria: Secondary | ICD-10-CM

## 2018-01-28 DIAGNOSIS — N401 Enlarged prostate with lower urinary tract symptoms: Secondary | ICD-10-CM | POA: Diagnosis not present

## 2018-01-28 MED ORDER — OXYCODONE HCL 15 MG PO TABS: 15.0000 mg | ORAL_TABLET | Freq: Four times a day (QID) | ORAL | 0 refills | Status: DC | PRN

## 2018-01-28 MED ORDER — OXYCODONE HCL 15 MG PO TABS
15.0000 mg | ORAL_TABLET | Freq: Four times a day (QID) | ORAL | 0 refills | Status: DC | PRN
Start: 2018-01-28 — End: 2018-04-29

## 2018-01-28 NOTE — Progress Notes (Signed)
Patient: Daniel Owens Male    DOB: 09-Dec-1945   72 y.o.   MRN: [id number] Visit Date: 01/28/2018  Today's Provider: [name], MD   Chief Complaint  Patient presents with  . Hypertension  . Pain   Subjective:    HPI  Hypertension, follow-up:  BP Readings from Last 3 Encounters:  01/28/18 (!) 132/58  12/16/17 (!) 146/62  12/09/17 128/60    He was last seen for hypertension 2 months ago.  BP at that visit was 146/62. Management since that visit includes increase amlodipine 10 mg. He reports good compliance with treatment. He is not having side effects.  He is exercising. He is adherent to low salt diet.   Outside blood pressures are not being checked as often. Patient denies chest pain, chest pressure/discomfort, claudication, dyspnea, exertional chest pressure/discomfort, fatigue, irregular heart beat, lower extremity edema, near-syncope, orthopnea, palpitations, paroxysmal nocturnal dyspnea, syncope and tachypnea.   Wt Readings from Last 3 Encounters:  01/28/18 159 lb (72.1 kg)  12/16/17 160 lb (72.6 kg)  12/09/17 162 lb (73.5 kg)   ------------------------------------------------------------------------ He reports that his raynaud's is better, but the weather is also warmer so he can not tell.      No Known Allergies   Current Outpatient Medications:  .  amLODipine (NORVASC) 10 MG tablet, Take 1 tablet (10 mg total) by mouth daily., Disp: 30 tablet, Rfl: 11 .  Ascorbic Acid (VITAMIN C) 1000 MG tablet, Take 1,000 mg by mouth daily., Disp: , Rfl:  .  aspirin EC 81 MG tablet, Take 1 tablet (81 mg total) by mouth daily., Disp: 90 tablet, Rfl: 3 .  B Complex Vitamins (VITAMIN B COMPLEX PO), Take 1 tablet by mouth daily., Disp: , Rfl:  .  beta carotene 25000 UNIT capsule, Take 25,000 Units by mouth daily., Disp: , Rfl:  .  celecoxib (CELEBREX) 200 MG capsule, TAKE 1 CAPSULE BY MOUTH EVERY DAY, Disp: 90 capsule, Rfl: 3 .  cholecalciferol (VITAMIN D) 1000  UNITS tablet, Take 1,000 Units by mouth daily., Disp: , Rfl:  .  Cyanocobalamin (B-12) 1000 MCG CAPS, 1 capsule once daily, Disp: 30 capsule, Rfl: 0 .  fish oil-omega-3 fatty acids 1000 MG capsule, Take 1 g by mouth daily., Disp: , Rfl:  .  folic acid (FOLVITE) 606 MCG tablet, Take 1 tablet (400 mcg total) by mouth daily., Disp: 30 tablet, Rfl: 0 .  GLUCOSAMINE-CHONDROIT-VIT C-MN PO, Take by mouth daily. , Disp: , Rfl:  .  metoprolol succinate (TOPROL-XL) 50 MG 24 hr tablet, Take 1/2 tablet (25 mg) by mouth once a day. Take with or immediately following a meal., Disp: 90 tablet, Rfl: 3 .  Multiple Vitamin (MULTIVITAMIN WITH MINERALS) TABS tablet, Take 1 tablet by mouth daily., Disp: , Rfl:  .  oxyCODONE (ROXICODONE) 15 MG immediate release tablet, Take 1 tablet (15 mg total) by mouth every 6 (six) hours as needed for pain., Disp: 120 tablet, Rfl: 0 .  rosuvastatin (CRESTOR) 5 MG tablet, Take 1 tablet (5 mg total) by mouth daily., Disp: 90 tablet, Rfl: 3 .  Selenium 200 MCG TABS, Take 200 mcg by mouth daily., Disp: , Rfl:  .  tadalafil (CIALIS) 5 MG tablet, ! Table daily for BPH, not ED, Disp: 90 tablet, Rfl: 3 .  doxycycline (VIBRA-TABS) 100 MG tablet, Take 1 tablet (100 mg total) by mouth 2 (two) times daily. (Patient not taking: Reported on 01/28/2018), Disp: 20 tablet, Rfl: 1  Review  of Systems  Constitutional: Negative.   HENT: Negative.   Eyes: Negative.   Respiratory: Negative.   Cardiovascular: Negative.   Gastrointestinal: Negative.   Endocrine: Negative.   Genitourinary: Positive for frequency.  Musculoskeletal: Negative.   Skin: Negative.   Allergic/Immunologic: Negative.   Neurological: Negative.   Hematological: Negative.   Psychiatric/Behavioral: Negative.     Social History   Tobacco Use  . Smoking status: Former Smoker    Last attempt to quit: 10/19/1977    Years since quitting: 40.3  . Smokeless tobacco: Current User    Types: Chew  Substance Use Topics  .  Alcohol use: Yes    Alcohol/week: 0.0 oz    Comment: very seldom- wine   Objective:   BP (!) 132/58 (BP Location: Left Arm, Patient Position: Sitting, Cuff Size: Normal)   Pulse 62   Temp 98.5 F (36.9 C) (Oral)   Resp 16   Wt 159 lb (72.1 kg)   BMI 21.56 kg/m  Vitals:   01/28/18 1135  BP: (!) 132/58  Pulse: 62  Resp: 16  Temp: 98.5 F (36.9 C)  TempSrc: Oral  Weight: 159 lb (72.1 kg)     Physical Exam  Constitutional: He is oriented to person, place, and time. He appears well-developed and well-nourished.  HENT:  Head: Normocephalic and atraumatic.  Eyes: No scleral icterus.  Neck: No thyromegaly present.  Cardiovascular: Normal rate, regular rhythm and normal heart sounds.  Pulmonary/Chest: Effort normal and breath sounds normal.  Abdominal: Soft.  Lymphadenopathy:    He has no cervical adenopathy.  Neurological: He is alert and oriented to person, place, and time.  Skin: Skin is warm and dry.  Psychiatric: He has a normal mood and affect. His behavior is normal. Judgment and thought content normal.        Assessment & Plan:     1. Other chronic pain RF narcotic times 3. 2.Cystitis/prostatitis possible Fluids,cranberry juice,Azo. Urine C and S.       I have done the exam and reviewed the chart and it is accurate to the best of my knowledge. Development worker, community has been used and  any errors in dictation or transcription are unintentional. Miguel Aschoff M.D. [place], MD  [facility]

## 2018-01-28 NOTE — Telephone Encounter (Signed)
I need someone to walk me through how to do multiple Rx at once--thx

## 2018-01-28 NOTE — Telephone Encounter (Signed)
Patient stated he was here early for ov. Patient states he usually gets 3 oxycodone 15 mg rx's at a time, but only 1 rx was sent to pharmacy this time. Patient is requesting someone call pharmacy with other 2 rx's?

## 2018-02-03 ENCOUNTER — Other Ambulatory Visit: Payer: Self-pay | Admitting: Family Medicine

## 2018-02-03 DIAGNOSIS — R351 Nocturia: Secondary | ICD-10-CM

## 2018-02-03 MED ORDER — TADALAFIL 5 MG PO TABS: ORAL_TABLET | ORAL | 3 refills | Status: DC

## 2018-02-03 NOTE — Telephone Encounter (Signed)
[facility] faxed a refill request for the following medication. Thanks CC  tadalafil (CIALIS) 5 MG tablet

## 2018-02-04 ENCOUNTER — Telehealth: Payer: Self-pay | Admitting: Family Medicine

## 2018-02-04 NOTE — Telephone Encounter (Signed)
Pt stated that he is out of town visiting his daughter. Pt stated that he thinks he has a sinus infection. Pt stated it started Monday 02/01/18. Pt stated his has a bad sore throat that gets worse when he swallows, cough, & congestion. Pt stated that he is blowing out thick and green discharge from his nose. Pt also stated he is has a lot of facial and head pain due to sinus pressure. Pt stated that he feels to bad to come in town to try to get an OV. Pt is requesting that Dr. [name] send in an antibiotic to [place] Phone# [phone]. Please advise. Thanks TNP

## 2018-02-04 NOTE — Telephone Encounter (Signed)
Mucinex and flonase and rinses.

## 2018-02-05 DIAGNOSIS — J0140 Acute pansinusitis, unspecified: Secondary | ICD-10-CM | POA: Diagnosis not present

## 2018-02-05 NOTE — Telephone Encounter (Signed)
FYI-Patient was extremely upset that he did not get a phone call and treatment yesterday after calling.  He said he did not believe the girl who took the message gave you the right information because he knew if he was able to talk to you, you would treat him with what he wanted.  He is upset that he is only being treated with OTC medications.  He states he is feeling worse, can't breath, has sore throat and before hanging up stated he felt like his throat was so bad it was closing up.  I had explained why treatment over the phone is not the best practice and that if he needed more than what was recommended he really needed to be seen by someone.   Patient said he did not think our recommendation would help him and he reluctantly conceded he needed to be seen and was going to seek treatment.

## 2018-02-10 ENCOUNTER — Telehealth: Payer: Self-pay | Admitting: Family Medicine

## 2018-02-10 ENCOUNTER — Other Ambulatory Visit: Payer: Self-pay

## 2018-02-10 MED ORDER — LOSARTAN POTASSIUM 25 MG PO TABS: 25.0000 mg | ORAL_TABLET | Freq: Every day | ORAL | 12 refills | Status: DC

## 2018-02-10 NOTE — Telephone Encounter (Signed)
Advised  ED 

## 2018-02-10 NOTE — Telephone Encounter (Signed)
It is almost certainly from heat plus begger dose of Norvasc for BP/Raynauds. Normal side effect.

## 2018-02-10 NOTE — Telephone Encounter (Signed)
LMTCB-Med has been sent to pharmacy, he is to take the 25 mg daily

## 2018-02-10 NOTE — Telephone Encounter (Signed)
Please advise 

## 2018-02-10 NOTE — Telephone Encounter (Signed)
Pt has been having a lot of swelling in his feet and ankles since he changed his blood pressure medication.   Please advise [phone]  Thanks Con Memos

## 2018-02-10 NOTE — Telephone Encounter (Signed)
LMTCB ED 

## 2018-02-10 NOTE — Telephone Encounter (Signed)
Patient states that he has swelling so bad that he had broken blood vessels in his feet, feels like he is walking on water.    Per Dr [name], he is to discontinue the Amlodipine and he can go back to a lower dose of the losartan and be seen in 1 month

## 2018-02-10 NOTE — Telephone Encounter (Signed)
Patient is returning your call and requesting a call back. °

## 2018-02-11 ENCOUNTER — Other Ambulatory Visit: Payer: Self-pay

## 2018-02-11 ENCOUNTER — Telehealth: Payer: Self-pay | Admitting: Family Medicine

## 2018-02-11 MED ORDER — LOSARTAN POTASSIUM 25 MG PO TABS: 25.0000 mg | ORAL_TABLET | Freq: Every day | ORAL | 3 refills | Status: DC

## 2018-02-11 NOTE — Telephone Encounter (Signed)
Pt stated that he had requested a 90 day supply of losartan (COZAAR) 25 MG tablet to be sent to [facility]. Pt would like a new Rx sent to [facility]. Please advise. Thanks TNP

## 2018-03-09 DIAGNOSIS — H2513 Age-related nuclear cataract, bilateral: Secondary | ICD-10-CM | POA: Diagnosis not present

## 2018-03-10 ENCOUNTER — Encounter: Payer: Self-pay | Admitting: Family Medicine

## 2018-03-10 ENCOUNTER — Ambulatory Visit (INDEPENDENT_AMBULATORY_CARE_PROVIDER_SITE_OTHER): Payer: Medicare Other | Admitting: Family Medicine

## 2018-03-10 VITALS — BP 120/54 | Temp 98.8°F | Resp 16 | Wt 160.0 lb

## 2018-03-10 DIAGNOSIS — M5441 Lumbago with sciatica, right side: Secondary | ICD-10-CM

## 2018-03-10 DIAGNOSIS — M5442 Lumbago with sciatica, left side: Secondary | ICD-10-CM

## 2018-03-10 DIAGNOSIS — I10 Essential (primary) hypertension: Secondary | ICD-10-CM | POA: Diagnosis not present

## 2018-03-10 DIAGNOSIS — M75122 Complete rotator cuff tear or rupture of left shoulder, not specified as traumatic: Secondary | ICD-10-CM | POA: Diagnosis not present

## 2018-03-10 DIAGNOSIS — G8929 Other chronic pain: Secondary | ICD-10-CM | POA: Diagnosis not present

## 2018-03-10 NOTE — Progress Notes (Signed)
Patient: Daniel Owens Male    DOB: [birth year]   72 y.o.   MRN: [id number] Visit Date: 03/10/2018  Today's Provider: [name], MD   Chief Complaint  Patient presents with  . Hypertension  . Foot Swelling   Subjective:    HPI Patient comes in today to follow up on HTN. His medications were changed about 1 month ago due to bilateral swelling in his ankles and feet. He was advised to discontinue the Amlodipine and start Losartan 25mg  daily. He reports that his swelling is much better, and has been tolerating the med changes well.     No Known Allergies   Current Outpatient Medications:  .  Ascorbic Acid (VITAMIN C) 1000 MG tablet, Take 1,000 mg by mouth daily., Disp: , Rfl:  .  aspirin EC 81 MG tablet, Take 1 tablet (81 mg total) by mouth daily., Disp: 90 tablet, Rfl: 3 .  B Complex Vitamins (VITAMIN B COMPLEX PO), Take 1 tablet by mouth daily., Disp: , Rfl:  .  beta carotene 25000 UNIT capsule, Take 25,000 Units by mouth daily., Disp: , Rfl:  .  cholecalciferol (VITAMIN D) 1000 UNITS tablet, Take 1,000 Units by mouth daily., Disp: , Rfl:  .  Cyanocobalamin (B-12) 1000 MCG CAPS, 1 capsule once daily, Disp: 30 capsule, Rfl: 0 .  fish oil-omega-3 fatty acids 1000 MG capsule, Take 1 g by mouth daily., Disp: , Rfl:  .  folic acid (FOLVITE) 751 MCG tablet, Take 1 tablet (400 mcg total) by mouth daily., Disp: 30 tablet, Rfl: 0 .  GLUCOSAMINE-CHONDROIT-VIT C-MN PO, Take by mouth daily. , Disp: , Rfl:  .  losartan (COZAAR) 25 MG tablet, Take 1 tablet (25 mg total) by mouth daily., Disp: 90 tablet, Rfl: 3 .  metoprolol succinate (TOPROL-XL) 50 MG 24 hr tablet, Take 1/2 tablet (25 mg) by mouth once a day. Take with or immediately following a meal., Disp: 90 tablet, Rfl: 3 .  Multiple Vitamin (MULTIVITAMIN WITH MINERALS) TABS tablet, Take 1 tablet by mouth daily., Disp: , Rfl:  .  oxyCODONE (ROXICODONE) 15 MG immediate release tablet, Take 1 tablet (15 mg total) by mouth every 6  (six) hours as needed for pain., Disp: 120 tablet, Rfl: 0 .  oxyCODONE (ROXICODONE) 15 MG immediate release tablet, Take 1 tablet (15 mg total) by mouth every 6 (six) hours as needed for pain., Disp: 120 tablet, Rfl: 0 .  rosuvastatin (CRESTOR) 5 MG tablet, Take 1 tablet (5 mg total) by mouth daily., Disp: 90 tablet, Rfl: 3 .  Selenium 200 MCG TABS, Take 200 mcg by mouth daily., Disp: , Rfl:  .  tadalafil (CIALIS) 5 MG tablet, ! Table daily for BPH, not ED, Disp: 90 tablet, Rfl: 3 .  celecoxib (CELEBREX) 200 MG capsule, TAKE 1 CAPSULE BY MOUTH EVERY DAY, Disp: 90 capsule, Rfl: 3 .  doxycycline (VIBRA-TABS) 100 MG tablet, Take 1 tablet (100 mg total) by mouth 2 (two) times daily. (Patient not taking: Reported on 01/28/2018), Disp: 20 tablet, Rfl: 1  Review of Systems  Constitutional: Negative for activity change and fatigue.  HENT: Negative.   Eyes: Negative.   Respiratory: Negative for cough.   Cardiovascular: Negative for chest pain, palpitations and leg swelling.  Musculoskeletal: Positive for arthralgias, back pain and myalgias. Negative for gait problem and neck pain.  Allergic/Immunologic: Negative.   Neurological: Negative for dizziness and headaches.  Psychiatric/Behavioral: Negative.     Social History   Tobacco  Use  . Smoking status: Former Smoker    Last attempt to quit: 10/19/1977    Years since quitting: 40.4  . Smokeless tobacco: Current User    Types: Chew  Substance Use Topics  . Alcohol use: Yes    Alcohol/week: 0.0 oz    Comment: very seldom- wine   Objective:   BP (!) 120/54 (BP Location: Right Arm, Patient Position: Sitting, Cuff Size: Normal)   Temp 98.8 F (37.1 C)   Resp 16   Wt 160 lb (72.6 kg)   BMI 21.70 kg/m  Vitals:   03/10/18 1541  BP: (!) 120/54  Resp: 16  Temp: 98.8 F (37.1 C)  Weight: 160 lb (72.6 kg)     Physical Exam  Constitutional: He is oriented to person, place, and time. He appears well-developed and well-nourished.  HENT:    Head: Normocephalic and atraumatic.  Eyes: Conjunctivae are normal. No scleral icterus.  Neck: No thyromegaly present.  Cardiovascular: Normal rate, regular rhythm and normal heart sounds.  Pulmonary/Chest: Effort normal and breath sounds normal.  Abdominal: Soft.  Musculoskeletal:  Mildly tender over right greater trochanter.  Neurological: He is alert and oriented to person, place, and time.  Skin: Skin is warm and dry.  Psychiatric: He has a normal mood and affect. His behavior is normal. Judgment and thought content normal.        Assessment & Plan:      HTN Controlled and pt feeling better on present regimen. Chronic Back/shoulder pain      I have done the exam and reviewed the above chart and it is accurate to the best of my knowledge. Development worker, community has been used in this note in any air is in the dictation or transcription are unintentional.  [name], MD  [facility]

## 2018-03-11 DIAGNOSIS — H1045 Other chronic allergic conjunctivitis: Secondary | ICD-10-CM | POA: Diagnosis not present

## 2018-03-16 DIAGNOSIS — M5417 Radiculopathy, lumbosacral region: Secondary | ICD-10-CM | POA: Diagnosis not present

## 2018-03-16 DIAGNOSIS — M4726 Other spondylosis with radiculopathy, lumbar region: Secondary | ICD-10-CM | POA: Diagnosis not present

## 2018-04-05 ENCOUNTER — Other Ambulatory Visit: Payer: Self-pay | Admitting: Family Medicine

## 2018-04-05 DIAGNOSIS — I10 Essential (primary) hypertension: Secondary | ICD-10-CM

## 2018-04-09 DIAGNOSIS — S51801A Unspecified open wound of right forearm, initial encounter: Secondary | ICD-10-CM | POA: Diagnosis not present

## 2018-04-15 DIAGNOSIS — D2272 Melanocytic nevi of left lower limb, including hip: Secondary | ICD-10-CM | POA: Diagnosis not present

## 2018-04-15 DIAGNOSIS — I872 Venous insufficiency (chronic) (peripheral): Secondary | ICD-10-CM | POA: Diagnosis not present

## 2018-04-15 DIAGNOSIS — L570 Actinic keratosis: Secondary | ICD-10-CM | POA: Diagnosis not present

## 2018-04-15 DIAGNOSIS — Z85828 Personal history of other malignant neoplasm of skin: Secondary | ICD-10-CM | POA: Diagnosis not present

## 2018-04-15 DIAGNOSIS — L814 Other melanin hyperpigmentation: Secondary | ICD-10-CM | POA: Diagnosis not present

## 2018-04-15 DIAGNOSIS — X32XXXA Exposure to sunlight, initial encounter: Secondary | ICD-10-CM | POA: Diagnosis not present

## 2018-04-15 DIAGNOSIS — S51811A Laceration without foreign body of right forearm, initial encounter: Secondary | ICD-10-CM | POA: Diagnosis not present

## 2018-04-15 DIAGNOSIS — Z08 Encounter for follow-up examination after completed treatment for malignant neoplasm: Secondary | ICD-10-CM | POA: Diagnosis not present

## 2018-04-15 DIAGNOSIS — L565 Disseminated superficial actinic porokeratosis (DSAP): Secondary | ICD-10-CM | POA: Diagnosis not present

## 2018-04-15 DIAGNOSIS — D2261 Melanocytic nevi of right upper limb, including shoulder: Secondary | ICD-10-CM | POA: Diagnosis not present

## 2018-04-15 DIAGNOSIS — D2262 Melanocytic nevi of left upper limb, including shoulder: Secondary | ICD-10-CM | POA: Diagnosis not present

## 2018-04-29 ENCOUNTER — Ambulatory Visit (INDEPENDENT_AMBULATORY_CARE_PROVIDER_SITE_OTHER): Payer: Medicare Other | Admitting: Family Medicine

## 2018-04-29 VITALS — BP 120/60 | HR 76 | Temp 98.1°F | Resp 16 | Wt 157.0 lb

## 2018-04-29 DIAGNOSIS — G8929 Other chronic pain: Secondary | ICD-10-CM

## 2018-04-29 DIAGNOSIS — I10 Essential (primary) hypertension: Secondary | ICD-10-CM | POA: Diagnosis not present

## 2018-04-29 DIAGNOSIS — R3912 Poor urinary stream: Secondary | ICD-10-CM | POA: Diagnosis not present

## 2018-04-29 DIAGNOSIS — R51 Headache: Secondary | ICD-10-CM

## 2018-04-29 DIAGNOSIS — R519 Headache, unspecified: Secondary | ICD-10-CM

## 2018-04-29 DIAGNOSIS — M1711 Unilateral primary osteoarthritis, right knee: Secondary | ICD-10-CM | POA: Diagnosis not present

## 2018-04-29 DIAGNOSIS — N401 Enlarged prostate with lower urinary tract symptoms: Secondary | ICD-10-CM

## 2018-04-29 LAB — POCT URINALYSIS DIPSTICK
Bilirubin, UA: NEGATIVE
Blood, UA: NEGATIVE
Glucose, UA: NEGATIVE
Ketones, UA: NEGATIVE
Leukocytes, UA: NEGATIVE
Nitrite, UA: NEGATIVE
Protein, UA: NEGATIVE
Spec Grav, UA: 1.015 (ref 1.010–1.025)
Urobilinogen, UA: 0.2 E.U./dL
pH, UA: 5 (ref 5.0–8.0)

## 2018-04-29 MED ORDER — OXYCODONE HCL 15 MG PO TABS: 15.0000 mg | ORAL_TABLET | Freq: Four times a day (QID) | ORAL | 0 refills | Status: DC | PRN

## 2018-04-29 NOTE — Progress Notes (Signed)
Daniel Owens  MRN: [id number] DOB: 01-17-1946  Subjective:  HPI   The patient is a 72 year old male who presents for follow up of chronic pain.  He was last seen for this 3 months ago and received his prescription narcotic pain medication at that time.  He returns today for same. He has seen optometry for some eye pain recently for which he recommended ESR. No headache or visual disturbance.No other symptoms.  Patient Active Problem List   Diagnosis Date Noted  . DOE (dyspnea on exertion) 07/17/2017  . Heart murmur 07/17/2017  . Abnormal EKG 07/17/2017  . Carotid artery disease ([place]) 07/17/2017  . Aortic atherosclerosis ([place]) 05/20/2017  . Hyperlipemia 05/20/2017  . BPH associated with nocturia 08/05/2016  . Esophageal reflux 03/18/2015  . Arthritis 03/18/2015  . Back pain, chronic 03/18/2015  . Cardiac conduction disorder 03/18/2015  . Narrowing of intervertebral disc space 03/18/2015  . Essential hypertension 03/18/2015  . History of anemia 03/18/2015  . History of atrial fibrillation 03/18/2015  . Weak pulse 03/18/2015  . Palpitations 03/18/2015  . Peyronie's disease 03/18/2015  . Raynaud's syndrome 03/18/2015  . Complete rotator cuff rupture of left shoulder 05/12/2013    Past Medical History:  Diagnosis Date  . Abnormal stress test    a. 07/2017 ETT: Ex 10:15, no chest pain, HTN response, 110m ST dep in II, II, aVF, V4-V6 - returned to baseline in 5 mins-->Intermediate risk study.  . Aortic atherosclerosis ([facility]    a. Incidentally noted on Chest CT in 2016.  . Carotid arterial disease ([facility]    a. 07/2014 carotid u/s: mild <50%, bilat ICA stenosis.  . Hypertension   . Palpitations    a. ? prior dx of PAF.  .Marland KitchenSystolic murmur    a. Reports h/o rheumatic fever as a child;  b. 07/2017 Echo: EF 55-60%, no rwma, triv AI, nl RV fxn.    Social History   Socioeconomic History  . Marital status: Married    Spouse name: Not on file  . Number of children: Not on file    . Years of education: Not on file  . Highest education level: Not on file  Occupational History  . Not on file  Social Needs  . Financial resource strain: Not on file  . Food insecurity:    Worry: Not on file    Inability: Not on file  . Transportation needs:    Medical: Not on file    Non-medical: Not on file  Tobacco Use  . Smoking status: Former Smoker    Last attempt to quit: 10/19/1977    Years since quitting: 40.5  . Smokeless tobacco: Current User    Types: Chew  Substance and Sexual Activity  . Alcohol use: Yes    Alcohol/week: 0.0 oz    Comment: very seldom- wine  . Drug use: No  . Sexual activity: Not on file  Lifestyle  . Physical activity:    Days per week: Not on file    Minutes per session: Not on file  . Stress: Not on file  Relationships  . Social connections:    Talks on phone: Not on file    Gets together: Not on file    Attends religious service: Not on file    Active member of club or organization: Not on file    Attends meetings of clubs or organizations: Not on file    Relationship status: Not on file  . Intimate partner violence:  Fear of current or ex partner: Not on file    Emotionally abused: Not on file    Physically abused: Not on file    Forced sexual activity: Not on file  Other Topics Concern  . Not on file  Social History Narrative  . Not on file    Outpatient Encounter Medications as of 04/29/2018  Medication Sig Note  . Ascorbic Acid (VITAMIN C) 1000 MG tablet Take 1,000 mg by mouth daily.   Marland Kitchen aspirin EC 81 MG tablet Take 1 tablet (81 mg total) by mouth daily.   . B Complex Vitamins (VITAMIN B COMPLEX PO) Take 1 tablet by mouth daily.   . beta carotene 25000 UNIT capsule Take 25,000 Units by mouth daily.   . celecoxib (CELEBREX) 200 MG capsule TAKE 1 CAPSULE BY MOUTH EVERY DAY   . cholecalciferol (VITAMIN D) 1000 UNITS tablet Take 1,000 Units by mouth daily.   . Cyanocobalamin (B-12) 1000 MCG CAPS 1 capsule once daily   .  fish oil-omega-3 fatty acids 1000 MG capsule Take 1 g by mouth daily.   . folic acid (FOLVITE) 761 MCG tablet Take 1 tablet (400 mcg total) by mouth daily.   Marland Kitchen GLUCOSAMINE-CHONDROIT-VIT C-MN PO Take by mouth daily.  03/18/2015: OTC Received from: [facility]  . losartan (COZAAR) 25 MG tablet Take 1 tablet (25 mg total) by mouth daily.   . metoprolol succinate (TOPROL-XL) 50 MG 24 hr tablet Take 1/2 tablet (25 mg) by mouth once a day. Take with or immediately following a meal.   . metoprolol succinate (TOPROL-XL) 50 MG 24 hr tablet TAKE 1 TABLET BY MOUTH ONCE DAILY, WITH OR IMMEDIATELY FOLLOWING A MEAL   . Multiple Vitamin (MULTIVITAMIN WITH MINERALS) TABS tablet Take 1 tablet by mouth daily.   Marland Kitchen oxyCODONE (ROXICODONE) 15 MG immediate release tablet Take 1 tablet (15 mg total) by mouth every 6 (six) hours as needed for pain.   Marland Kitchen oxyCODONE (ROXICODONE) 15 MG immediate release tablet Take 1 tablet (15 mg total) by mouth every 6 (six) hours as needed for pain.   . rosuvastatin (CRESTOR) 5 MG tablet Take 1 tablet (5 mg total) by mouth daily.   . Selenium 200 MCG TABS Take 200 mcg by mouth daily.   . tadalafil (CIALIS) 5 MG tablet ! Table daily for BPH, not ED   . [DISCONTINUED] doxycycline (VIBRA-TABS) 100 MG tablet Take 1 tablet (100 mg total) by mouth 2 (two) times daily.    No facility-administered encounter medications on file as of 04/29/2018.     No Known Allergies  Review of Systems  Constitutional: Negative for fever and malaise/fatigue.  HENT: Negative.   Eyes: Positive for pain.  Respiratory: Negative for cough, shortness of breath and wheezing.   Cardiovascular: Positive for leg swelling (minimal, goes down overnight). Negative for chest pain, palpitations, orthopnea and claudication.  Gastrointestinal: Negative.   Musculoskeletal: Positive for back pain, joint pain and neck pain. Negative for falls and myalgias.  Skin: Negative.   Neurological: Negative for  headaches.  Endo/Heme/Allergies: Negative.   Psychiatric/Behavioral: Negative.     Objective:  BP 120/60 (BP Location: Right Arm, Patient Position: Sitting, Cuff Size: Normal)   Pulse 76   Temp 98.1 F (36.7 C) (Oral)   Resp 16   Wt 157 lb (71.2 kg)   BMI 21.29 kg/m   Physical Exam  Constitutional: He is oriented to person, place, and time and well-developed, well-nourished, and in no distress.  HENT:  Head:  Normocephalic and atraumatic.  Right Ear: External ear normal.  Left Ear: External ear normal.  Nose: Nose normal.  Mouth/Throat: Oropharynx is clear and moist.  Eyes: Conjunctivae are normal. No scleral icterus.  Neck: No thyromegaly present.  Cardiovascular: Normal rate, regular rhythm and normal heart sounds.  Pulmonary/Chest: Effort normal and breath sounds normal.  Abdominal: Soft.  Neurological: He is alert and oriented to person, place, and time. No cranial nerve deficit. Gait normal. GCS score is 15.  Skin: Skin is warm and dry.  Psychiatric: Mood, memory, affect and judgment normal.    Assessment and Plan :  1. Other chronic pain  - oxyCODONE (ROXICODONE) 15 MG immediate release tablet; Take 1 tablet (15 mg total) by mouth every 6 (six) hours as needed for pain.  Dispense: 120 tablet; Refill: 0 - oxyCODONE (ROXICODONE) 15 MG immediate release tablet; Take 1 tablet (15 mg total) by mouth every 6 (six) hours as needed for pain.  Dispense: 120 tablet; Refill: 0  2. Essential hypertension  - Lipid panel - Comprehensive metabolic panel - TSH  3. Osteoarthritis of right knee, unspecified osteoarthritis type  - Sed Rate (ESR) - C-reactive protein  4. Nonintractable headache, unspecified chronicity pattern, unspecified headache type/Eye pain Doubt temporal arteritis. - CBC with Differential/Platelet  5. Benign prostatic hyperplasia with weak urinary stream  - PSA - POCT urinalysis dipstick

## 2018-05-04 DIAGNOSIS — R3912 Poor urinary stream: Secondary | ICD-10-CM | POA: Diagnosis not present

## 2018-05-04 DIAGNOSIS — M1711 Unilateral primary osteoarthritis, right knee: Secondary | ICD-10-CM | POA: Diagnosis not present

## 2018-05-04 DIAGNOSIS — E538 Deficiency of other specified B group vitamins: Secondary | ICD-10-CM | POA: Diagnosis not present

## 2018-05-04 DIAGNOSIS — R51 Headache: Secondary | ICD-10-CM | POA: Diagnosis not present

## 2018-05-04 DIAGNOSIS — N401 Enlarged prostate with lower urinary tract symptoms: Secondary | ICD-10-CM | POA: Diagnosis not present

## 2018-05-04 DIAGNOSIS — I10 Essential (primary) hypertension: Secondary | ICD-10-CM | POA: Diagnosis not present

## 2018-05-05 LAB — CBC WITH DIFFERENTIAL/PLATELET
Basophils Absolute: 0 10*3/uL (ref 0.0–0.2)
Basos: 0 %
EOS (ABSOLUTE): 0.1 10*3/uL (ref 0.0–0.4)
Eos: 1 %
Hematocrit: 37.3 % — ABNORMAL LOW (ref 37.5–51.0)
Hemoglobin: 12.3 g/dL — ABNORMAL LOW (ref 13.0–17.7)
Immature Grans (Abs): 0 10*3/uL (ref 0.0–0.1)
Immature Granulocytes: 0 %
Lymphocytes Absolute: 1.4 10*3/uL (ref 0.7–3.1)
Lymphs: 33 %
MCH: 35 pg — ABNORMAL HIGH (ref 26.6–33.0)
MCHC: 33 g/dL (ref 31.5–35.7)
MCV: 106 fL — ABNORMAL HIGH (ref 79–97)
Monocytes Absolute: 0.4 10*3/uL (ref 0.1–0.9)
Monocytes: 8 %
Neutrophils Absolute: 2.5 10*3/uL (ref 1.4–7.0)
Neutrophils: 58 %
Platelets: 185 10*3/uL (ref 150–450)
RBC: 3.51 x10E6/uL — ABNORMAL LOW (ref 4.14–5.80)
RDW: 13.6 % (ref 12.3–15.4)
WBC: 4.3 10*3/uL (ref 3.4–10.8)

## 2018-05-05 LAB — COMPREHENSIVE METABOLIC PANEL
ALT: 18 IU/L (ref 0–44)
AST: 22 IU/L (ref 0–40)
Albumin/Globulin Ratio: 2.4 — ABNORMAL HIGH (ref 1.2–2.2)
Albumin: 4.5 g/dL (ref 3.5–4.8)
Alkaline Phosphatase: 47 IU/L (ref 39–117)
BUN/Creatinine Ratio: 24 (ref 10–24)
BUN: 24 mg/dL (ref 8–27)
Bilirubin Total: 0.4 mg/dL (ref 0.0–1.2)
CO2: 25 mmol/L (ref 20–29)
Calcium: 9.3 mg/dL (ref 8.6–10.2)
Chloride: 101 mmol/L (ref 96–106)
Creatinine, Ser: 1 mg/dL (ref 0.76–1.27)
GFR calc Af Amer: 88 mL/min/{1.73_m2} (ref >59)
GFR calc non Af Amer: 76 mL/min/{1.73_m2} (ref >59)
Globulin, Total: 1.9 g/dL (ref 1.5–4.5)
Glucose: 82 mg/dL (ref 65–99)
Potassium: 4 mmol/L (ref 3.5–5.2)
Sodium: 140 mmol/L (ref 134–144)
Total Protein: 6.4 g/dL (ref 6.0–8.5)

## 2018-05-05 LAB — LIPID PANEL
Chol/HDL Ratio: 1.7 ratio (ref 0.0–5.0)
Cholesterol, Total: 164 mg/dL (ref 100–199)
HDL: 98 mg/dL (ref >39)
LDL Calculated: 51 mg/dL (ref 0–99)
Triglycerides: 76 mg/dL (ref 0–149)
VLDL Cholesterol Cal: 15 mg/dL (ref 5–40)

## 2018-05-05 LAB — PSA: Prostate Specific Ag, Serum: 1 ng/mL (ref 0.0–4.0)

## 2018-05-05 LAB — C-REACTIVE PROTEIN: CRP: <1 mg/L (ref 0–10)

## 2018-05-05 LAB — TSH: TSH: 2.25 u[IU]/mL (ref 0.450–4.500)

## 2018-05-05 LAB — SEDIMENTATION RATE: Sed Rate: 5 mm/hr (ref 0–30)

## 2018-05-18 ENCOUNTER — Other Ambulatory Visit: Payer: Self-pay | Admitting: Family Medicine

## 2018-05-18 DIAGNOSIS — I700 Atherosclerosis of aorta: Secondary | ICD-10-CM

## 2018-05-18 LAB — B12 AND FOLATE PANEL
Folate: >20 ng/mL (ref >3.0)
Vitamin B-12: 1398 pg/mL — ABNORMAL HIGH (ref 232–1245)

## 2018-05-18 LAB — SPECIMEN STATUS REPORT

## 2018-05-19 DIAGNOSIS — G8929 Other chronic pain: Secondary | ICD-10-CM | POA: Diagnosis not present

## 2018-05-19 DIAGNOSIS — M170 Bilateral primary osteoarthritis of knee: Secondary | ICD-10-CM | POA: Diagnosis not present

## 2018-05-19 DIAGNOSIS — M25561 Pain in right knee: Secondary | ICD-10-CM | POA: Diagnosis not present

## 2018-05-20 ENCOUNTER — Telehealth: Payer: Self-pay

## 2018-05-20 NOTE — Telephone Encounter (Signed)
LMTCB and schedule AWV prior to CPE on 06/14/18. There is a 9:20 opening on [facility] schedule prior to 10 o'clock CPE. Will block slot until pt CB. -MM

## 2018-05-25 NOTE — Telephone Encounter (Signed)
Spoke with wife who states pt is not available and they will be out of town until tomorrow afternoon. Pt to CB and if not by 8/8, I will CB again. -MM

## 2018-06-01 DIAGNOSIS — M25561 Pain in right knee: Secondary | ICD-10-CM | POA: Diagnosis not present

## 2018-06-01 DIAGNOSIS — G8929 Other chronic pain: Secondary | ICD-10-CM | POA: Diagnosis not present

## 2018-06-01 DIAGNOSIS — M25562 Pain in left knee: Secondary | ICD-10-CM | POA: Diagnosis not present

## 2018-06-01 DIAGNOSIS — M170 Bilateral primary osteoarthritis of knee: Secondary | ICD-10-CM | POA: Diagnosis not present

## 2018-06-01 NOTE — Telephone Encounter (Signed)
Scheduled for 06/14/18 @ 2 PM. -MM

## 2018-06-14 ENCOUNTER — Encounter: Payer: Self-pay | Admitting: Family Medicine

## 2018-06-14 ENCOUNTER — Ambulatory Visit (INDEPENDENT_AMBULATORY_CARE_PROVIDER_SITE_OTHER): Payer: Medicare Other

## 2018-06-14 VITALS — BP 110/52 | HR 52 | Temp 98.7°F | Ht 72.0 in | Wt 160.8 lb

## 2018-06-14 DIAGNOSIS — Z0000 Encounter for general adult medical examination without abnormal findings: Secondary | ICD-10-CM | POA: Diagnosis not present

## 2018-06-14 NOTE — Patient Instructions (Addendum)
Daniel Owens , Thank you for taking time to come for your Medicare Wellness Visit. I appreciate your ongoing commitment to your health goals. Please review the following plan we discussed and let me know if I can assist you in the future.   Screening recommendations/referrals: Colonoscopy: Up to date Recommended yearly ophthalmology/optometry visit for glaucoma screening and checkup Recommended yearly dental visit for hygiene and checkup  Vaccinations: Influenza vaccine: Up to date Pneumococcal vaccine: Up to date Tdap vaccine: Up to date Shingles vaccine: Pt declines today.     Advanced directives: Please bring a copy of your POA (Power of Attorney) and/or Living Will to your next appointment.   Conditions/risks identified:  Recommend to cut back on tea consumption and avoid drinking it before bed.    Next appointment: 08/26/18 @ 10:20 AM with Dr [name].   Preventive Care 35 Years and Older, Male Preventive care refers to lifestyle choices and visits with your health care provider that can promote health and wellness. What does preventive care include?  A yearly physical exam. This is also called an annual well check.  Dental exams once or twice a year.  Routine eye exams. Ask your health care provider how often you should have your eyes checked.  Personal lifestyle choices, including:  Daily care of your teeth and gums.  Regular physical activity.  Eating a healthy diet.  Avoiding tobacco and drug use.  Limiting alcohol use.  Practicing safe sex.  Taking low doses of aspirin every day.  Taking vitamin and mineral supplements as recommended by your health care provider. What happens during an annual well check? The services and screenings done by your health care provider during your annual well check will depend on your age, overall health, lifestyle risk factors, and family history of disease. Counseling  Your health care provider may ask you questions about  your:  Alcohol use.  Tobacco use.  Drug use.  Emotional well-being.  Home and relationship well-being.  Sexual activity.  Eating habits.  History of falls.  Memory and ability to understand (cognition).  Work and work Statistician. Screening  You may have the following tests or measurements:  Height, weight, and BMI.  Blood pressure.  Lipid and cholesterol levels. These may be checked every 5 years, or more frequently if you are over 30 years old.  Skin check.  Lung cancer screening. You may have this screening every year starting at age 56 if you have a 30-pack-year history of smoking and currently smoke or have quit within the past 15 years.  Fecal occult blood test (FOBT) of the stool. You may have this test every year starting at age 41.  Flexible sigmoidoscopy or colonoscopy. You may have a sigmoidoscopy every 5 years or a colonoscopy every 10 years starting at age 60.  Prostate cancer screening. Recommendations will vary depending on your family history and other risks.  Hepatitis C blood test.  Hepatitis B blood test.  Sexually transmitted disease (STD) testing.  Diabetes screening. This is done by checking your blood sugar (glucose) after you have not eaten for a while (fasting). You may have this done every 1-3 years.  Abdominal aortic aneurysm (AAA) screening. You may need this if you are a current or former smoker.  Osteoporosis. You may be screened starting at age 73 if you are at high risk. Talk with your health care provider about your test results, treatment options, and if necessary, the need for more tests. Vaccines  Your health care provider  may recommend certain vaccines, such as:  Influenza vaccine. This is recommended every year.  Tetanus, diphtheria, and acellular pertussis (Tdap, Td) vaccine. You may need a Td booster every 10 years.  Zoster vaccine. You may need this after age 70.  Pneumococcal 13-valent conjugate (PCV13) vaccine.  One dose is recommended after age 37.  Pneumococcal polysaccharide (PPSV23) vaccine. One dose is recommended after age 37. Talk to your health care provider about which screenings and vaccines you need and how often you need them. This information is not intended to replace advice given to you by your health care provider. Make sure you discuss any questions you have with your health care provider. Document Released: 11/02/2015 Document Revised: 06/25/2016 Document Reviewed: 08/07/2015 Elsevier Interactive Patient Education  2017 [place] Prevention in the Home Falls can cause injuries. They can happen to people of all ages. There are many things you can do to make your home safe and to help prevent falls. What can I do on the outside of my home?  Regularly fix the edges of walkways and driveways and fix any cracks.  Remove anything that might make you trip as you walk through a door, such as a raised step or threshold.  Trim any bushes or trees on the path to your home.  Use bright outdoor lighting.  Clear any walking paths of anything that might make someone trip, such as rocks or tools.  Regularly check to see if handrails are loose or broken. Make sure that both sides of any steps have handrails.  Any raised decks and porches should have guardrails on the edges.  Have any leaves, snow, or ice cleared regularly.  Use sand or salt on walking paths during winter.  Clean up any spills in your garage right away. This includes oil or grease spills. What can I do in the bathroom?  Use night lights.  Install grab bars by the toilet and in the tub and shower. Do not use towel bars as grab bars.  Use non-skid mats or decals in the tub or shower.  If you need to sit down in the shower, use a plastic, non-slip stool.  Keep the floor dry. Clean up any water that spills on the floor as soon as it happens.  Remove soap buildup in the tub or shower regularly.  Attach bath  mats securely with double-sided non-slip rug tape.  Do not have throw rugs and other things on the floor that can make you trip. What can I do in the bedroom?  Use night lights.  Make sure that you have a light by your bed that is easy to reach.  Do not use any sheets or blankets that are too big for your bed. They should not hang down onto the floor.  Have a firm chair that has side arms. You can use this for support while you get dressed.  Do not have throw rugs and other things on the floor that can make you trip. What can I do in the kitchen?  Clean up any spills right away.  Avoid walking on wet floors.  Keep items that you use a lot in easy-to-reach places.  If you need to reach something above you, use a strong step stool that has a grab bar.  Keep electrical cords out of the way.  Do not use floor polish or wax that makes floors slippery. If you must use wax, use non-skid floor wax.  Do not have throw rugs  and other things on the floor that can make you trip. What can I do with my stairs?  Do not leave any items on the stairs.  Make sure that there are handrails on both sides of the stairs and use them. Fix handrails that are broken or loose. Make sure that handrails are as long as the stairways.  Check any carpeting to make sure that it is firmly attached to the stairs. Fix any carpet that is loose or worn.  Avoid having throw rugs at the top or bottom of the stairs. If you do have throw rugs, attach them to the floor with carpet tape.  Make sure that you have a light switch at the top of the stairs and the bottom of the stairs. If you do not have them, ask someone to add them for you. What else can I do to help prevent falls?  Wear shoes that:  Do not have high heels.  Have rubber bottoms.  Are comfortable and fit you well.  Are closed at the toe. Do not wear sandals.  If you use a stepladder:  Make sure that it is fully opened. Do not climb a closed  stepladder.  Make sure that both sides of the stepladder are locked into place.  Ask someone to hold it for you, if possible.  Clearly mark and make sure that you can see:  Any grab bars or handrails.  First and last steps.  Where the edge of each step is.  Use tools that help you move around (mobility aids) if they are needed. These include:  Canes.  Walkers.  Scooters.  Crutches.  Turn on the lights when you go into a dark area. Replace any light bulbs as soon as they burn out.  Set up your furniture so you have a clear path. Avoid moving your furniture around.  If any of your floors are uneven, fix them.  If there are any pets around you, be aware of where they are.  Review your medicines with your doctor. Some medicines can make you feel dizzy. This can increase your chance of falling. Ask your doctor what other things that you can do to help prevent falls. This information is not intended to replace advice given to you by your health care provider. Make sure you discuss any questions you have with your health care provider. Document Released: 08/02/2009 Document Revised: 03/13/2016 Document Reviewed: 11/10/2014 Elsevier Interactive Patient Education  2017 [employer].

## 2018-06-14 NOTE — Progress Notes (Signed)
Subjective:   Daniel Owens is a 72 y.o. male who presents for Medicare Annual/Subsequent preventive examination.  Review of Systems:  N/A  Cardiac Risk Factors include: advanced age (>33men, >73 women);dyslipidemia;hypertension;male gender     Objective:    Vitals: BP (!) 110/52 (BP Location: Right Arm)   Pulse (!) 54   Temp 98.7 F (37.1 C) (Oral)   Ht 6' (1.829 m)   Wt 160 lb 12.8 oz (72.9 kg)   BMI 21.81 kg/m   Body mass index is 21.81 kg/m.  Advanced Directives 06/14/2018 06/12/2017 02/11/2017 03/24/2016 06/05/2015  Does Patient Have a Medical Advance Directive? Yes Yes Yes Yes Yes  Type of Paramedic of [place];Living will [place];Living will [place] will  Does patient want to make changes to medical advance directive? - - - No - Patient declined -  Copy of [place] in Chart? No - copy requested No - copy requested - No - copy requested -    Tobacco Social History   Tobacco Use  Smoking Status Former Smoker  . Types: Cigarettes  . Last attempt to quit: 10/19/1977  . Years since quitting: 40.6  Smokeless Tobacco Current User  . Types: Chew     Ready to quit: Yes Counseling given: Yes   Clinical Intake:  Pre-visit preparation completed: Yes  Pain : 0-10 Pain Score: 4  Pain Type: Chronic pain Pain Location: Back(and knees) Pain Descriptors / Indicators: Aching, Dull Pain Frequency: Constant     Nutritional Status: BMI of 19-24  Normal Nutritional Risks: None Diabetes: No  How often do you need to have someone help you when you read instructions, pamphlets, or other written materials from your doctor or pharmacy?: 1 - Never  Interpreter Needed?: No  Information entered by :: [facility], LPN  Past Medical History:  Diagnosis Date  . Abnormal stress test    a. 07/2017 ETT: Ex 10:15, no chest pain, HTN response, 72mm ST dep in II, II, aVF, V4-V6 - returned to  baseline in 5 mins-->Intermediate risk study.  . Aortic atherosclerosis ([place])    a. Incidentally noted on Chest CT in 2016.  . Carotid arterial disease ([place])    a. 07/2014 carotid u/s: mild <50%, bilat ICA stenosis.  . Hypertension   . osteoarthritis   . Palpitations    a. ? prior dx of PAF.  Marland Kitchen Raynauds syndrome   . Systolic murmur    a. Reports h/o rheumatic fever as a child;  b. 07/2017 Echo: EF 55-60%, no rwma, triv AI, nl RV fxn.   Past Surgical History:  Procedure Laterality Date  . APPENDECTOMY    . BILATERAL KNEE ARTHROSCOPY    . HERNIA REPAIR    . NECK SURGERY     involving disc  . SHOULDER SURGERY     Right-x 3 overall  . TENDON REPAIR     involving Tricep tendon   Family History  Problem Relation Age of Onset  . Stroke Mother   . Uterine cancer Mother   . Throat cancer Father   . Allergic rhinitis Brother   . Prostate cancer Maternal Uncle   . Prostate cancer Paternal Uncle   . Heart attack Maternal Grandmother   . Heart attack Maternal Grandfather    Social History   Socioeconomic History  . Marital status: Married    Spouse name: Not on file  . Number of children: 2  . Years of  education: Not on file  . Highest education level: Associate degree: academic program  Occupational History  . Occupation: retired  Scientific laboratory technician  . Financial resource strain: Not hard at all  . Food insecurity:    Worry: Never true    Inability: Never true  . Transportation needs:    Medical: No    Non-medical: No  Tobacco Use  . Smoking status: Former Smoker    Types: Cigarettes    Last attempt to quit: 10/19/1977    Years since quitting: 40.6  . Smokeless tobacco: Current User    Types: Chew  Substance and Sexual Activity  . Alcohol use: Yes    Alcohol/week: 0.0 standard drinks    Comment: very seldom- wine  . Drug use: No  . Sexual activity: Not on file  Lifestyle  . Physical activity:    Days per week: Not on file    Minutes per session: Not on file  .  Stress: Only a little  Relationships  . Social connections:    Talks on phone: Not on file    Gets together: Not on file    Attends religious service: Not on file    Active member of club or organization: Not on file    Attends meetings of clubs or organizations: Not on file    Relationship status: Not on file  Other Topics Concern  . Not on file  Social History Narrative  . Not on file    Outpatient Encounter Medications as of 06/14/2018  Medication Sig  . Ascorbic Acid (VITAMIN C) 1000 MG tablet Take 1,000 mg by mouth daily.  Marland Kitchen aspirin EC 81 MG tablet Take 1 tablet (81 mg total) by mouth daily.  . B Complex Vitamins (VITAMIN B COMPLEX PO) Take 1 tablet by mouth daily.  . beta carotene 25000 UNIT capsule Take 25,000 Units by mouth daily.  . celecoxib (CELEBREX) 200 MG capsule TAKE 1 CAPSULE BY MOUTH EVERY DAY  . cholecalciferol (VITAMIN D) 1000 UNITS tablet Take 1,000 Units by mouth daily.  . Cyanocobalamin (B-12) 1000 MCG CAPS 1 capsule once daily  . fish oil-omega-3 fatty acids 1000 MG capsule Take 1 g by mouth daily.  . folic acid (FOLVITE) 562 MCG tablet Take 1 tablet (400 mcg total) by mouth daily.  Marland Kitchen GLUCOSAMINE-CHONDROIT-VIT C-MN PO Take by mouth daily.   Marland Kitchen losartan (COZAAR) 25 MG tablet Take 1 tablet (25 mg total) by mouth daily.  . metoprolol succinate (TOPROL-XL) 50 MG 24 hr tablet TAKE 1 TABLET BY MOUTH ONCE DAILY, WITH OR IMMEDIATELY FOLLOWING A MEAL  . Multiple Vitamin (MULTIVITAMIN WITH MINERALS) TABS tablet Take 1 tablet by mouth daily.  Marland Kitchen oxyCODONE (ROXICODONE) 15 MG immediate release tablet Take 1 tablet (15 mg total) by mouth every 6 (six) hours as needed for pain.  . rosuvastatin (CRESTOR) 5 MG tablet TAKE 1 TABLET(5 MG) BY MOUTH DAILY  . Selenium 200 MCG TABS Take 200 mcg by mouth daily.  . tadalafil (CIALIS) 5 MG tablet ! Table daily for BPH, not ED  . metoprolol succinate (TOPROL-XL) 50 MG 24 hr tablet Take 1/2 tablet (25 mg) by mouth once a day. Take with or  immediately following a meal.  . oxyCODONE (ROXICODONE) 15 MG immediate release tablet Take 1 tablet (15 mg total) by mouth every 6 (six) hours as needed for pain. (Patient not taking: Reported on 06/14/2018)  . oxyCODONE (ROXICODONE) 15 MG immediate release tablet Take 1 tablet (15 mg total) by mouth every 6 (  six) hours as needed for pain. (Patient not taking: Reported on 06/14/2018)   No facility-administered encounter medications on file as of 06/14/2018.     Activities of Daily Living In your present state of health, do you have any difficulty performing the following activities: 06/14/2018  Hearing? N  Vision? N  Difficulty concentrating or making decisions? N  Walking or climbing stairs? Y  Comment Due to back and knee pain.   Dressing or bathing? N  Doing errands, shopping? N  Preparing Food and eating ? N  Using the Toilet? N  In the past six months, have you accidently leaked urine? N  Do you have problems with loss of bowel control? N  Managing your Medications? N  Managing your Finances? N  Housekeeping or managing your Housekeeping? N  Some recent data might be hidden    Patient Care Team: Jerrol Banana., MD as PCP - General (Family Medicine) Anell Barr, OD as Consulting Physician (Optometry) Kristeen Miss, MD as Consulting Physician (Neurosurgery) Marry Guan, Laurice Record, MD (Orthopedic Surgery)   Assessment:   This is a routine wellness examination for Daniel Owens.  Exercise Activities and Dietary recommendations Current Exercise Habits: The patient does not participate in regular exercise at present, Exercise limited by: orthopedic condition(s)  Goals    . Increase water intake     Recommend increasing water intake to 4-6 glasses a day.     . Reduce caffeine intake     Recommend to cut back on tea intake and avoid completely at bedtime.        Fall Risk Fall Risk  06/14/2018 06/12/2017 06/10/2016 06/05/2015  Falls in the past year? No Yes Yes No  Number falls  in past yr: - 1 1 -  Injury with Fall? - Yes No -  Comment - 2 broken vertebreas  - -  Follow up - Falls prevention discussed - -   Is the patient's home free of loose throw rugs in walkways, pet beds, electrical cords, etc?   yes      Grab bars in the bathroom? yes      Handrails on the stairs?   no      Adequate lighting?   yes  Timed Get Up and Go Performed: N/A  Depression Screen PHQ 2/9 Scores 06/14/2018 06/12/2017 06/10/2016 06/05/2015  PHQ - 2 Score 0 0 0 0    Cognitive Function     6CIT Screen 06/14/2018 06/12/2017  What Year? 0 points 0 points  What month? 0 points 0 points  What time? 0 points 0 points  Count back from 20 0 points 0 points  Months in reverse 0 points 0 points  Repeat phrase 0 points 2 points  Total Score 0 2    Immunization History  Administered Date(s) Administered  . Influenza, High Dose Seasonal PF 07/30/2015, 07/30/2017  . Pneumococcal Conjugate-13 08/08/2014  . Pneumococcal Polysaccharide-23 11/15/2012  . Td 06/12/2016  . Zoster 04/09/2010    Qualifies for Shingles Vaccine? Due for Shingles vaccine. Declined my offer to administer today. Education has been provided regarding the importance of this vaccine. Pt has been advised to call her insurance company to determine her out of pocket expense. Advised she may also receive this vaccine at her local pharmacy or Health Dept. Verbalized acceptance and understanding.  Screening Tests Health Maintenance  Topic Date Due  . INFLUENZA VACCINE  05/20/2018  . COLONOSCOPY  12/25/2022  . TETANUS/TDAP  06/12/2026  . Hepatitis C Screening  Completed  . PNA vac Low Risk Adult  Completed   Cancer Screenings: Lung: Low Dose CT Chest recommended if Age 45-80 years, 30 pack-year currently smoking OR have quit w/in 15years. Patient does not qualify. Colorectal: Up to date  Additional Screenings:  Hepatitis C Screening: Up to date      Plan:  I have personally reviewed and addressed the Medicare Annual  Wellness questionnaire and have noted the following in the patient's chart:  A. Medical and social history B. Use of alcohol, tobacco or illicit drugs  C. Current medications and supplements D. Functional ability and status E.  Nutritional status F.  Physical activity G. Advance directives H. List of other physicians I.  Hospitalizations, surgeries, and ER visits in previous 12 months J.  [place] such as hearing and vision if needed, cognitive and depression L. Referrals and appointments - none  In addition, I have reviewed and discussed with patient certain preventive protocols, quality metrics, and best practice recommendations. A written personalized care plan for preventive services as well as general preventive health recommendations were provided to patient.  See attached scanned questionnaire for additional information.   Signed,  Fabio Neighbors, LPN Nurse Health Advisor   Nurse Recommendations: None.

## 2018-07-21 ENCOUNTER — Other Ambulatory Visit: Payer: Self-pay | Admitting: Family Medicine

## 2018-07-21 DIAGNOSIS — G8929 Other chronic pain: Secondary | ICD-10-CM

## 2018-07-21 NOTE — Telephone Encounter (Signed)
Please review. Thanks!  

## 2018-07-21 NOTE — Telephone Encounter (Signed)
Pt called saying he will needs a refill on his Oxycodone 15 mg immediate release for the month of Oct.  He will be out around the 18th.  He has a physical first of November.  He uses [facility] in [place]  CB#  [phone]  Thanks Con Memos

## 2018-07-26 MED ORDER — OXYCODONE HCL 15 MG PO TABS: 15.0000 mg | ORAL_TABLET | Freq: Four times a day (QID) | ORAL | 0 refills | Status: DC | PRN

## 2018-07-27 DIAGNOSIS — Z23 Encounter for immunization: Secondary | ICD-10-CM | POA: Diagnosis not present

## 2018-08-26 ENCOUNTER — Encounter: Payer: Self-pay | Admitting: Family Medicine

## 2018-08-26 ENCOUNTER — Ambulatory Visit (INDEPENDENT_AMBULATORY_CARE_PROVIDER_SITE_OTHER): Payer: Medicare Other | Admitting: Family Medicine

## 2018-08-26 VITALS — BP 110/58 | HR 62 | Temp 99.0°F | Resp 16 | Ht 72.0 in | Wt 163.0 lb

## 2018-08-26 DIAGNOSIS — D649 Anemia, unspecified: Secondary | ICD-10-CM

## 2018-08-26 DIAGNOSIS — M1990 Unspecified osteoarthritis, unspecified site: Secondary | ICD-10-CM | POA: Diagnosis not present

## 2018-08-26 DIAGNOSIS — E7800 Pure hypercholesterolemia, unspecified: Secondary | ICD-10-CM

## 2018-08-26 DIAGNOSIS — G8929 Other chronic pain: Secondary | ICD-10-CM

## 2018-08-26 DIAGNOSIS — I10 Essential (primary) hypertension: Secondary | ICD-10-CM

## 2018-08-26 NOTE — Progress Notes (Signed)
Patient: Daniel Owens Male    DOB: 08-20-1946   72 y.o.   MRN: [id number] Visit Date: 08/26/2018  Today's Provider: [name], MD   Chief Complaint  Patient presents with  . Hypertension  . Hyperlipidemia  . Knee Pain   Subjective:   Patient saw [facility] for AWE on 06/14/18.   HPI  Hypertension, follow-up:  BP Readings from Last 3 Encounters:  08/26/18 (!) 110/58  06/14/18 (!) 110/52  04/29/18 120/60    He was last seen for hypertension 6 months ago.  BP at that visit was 120/60. Management since that visit includes no changes. He reports good compliance with treatment. He is not having side effects.  He is not exercising, but he does stay active.  He is adherent to low salt diet.   Outside blood pressures are checked daily.  Patient denies fatigue, lower extremity edema and palpitations.   Cardiovascular risk factors include dyslipidemia.   Weight trend: stable Wt Readings from Last 3 Encounters:  08/26/18 163 lb (73.9 kg)  06/14/18 160 lb 12.8 oz (72.9 kg)  04/29/18 157 lb (71.2 kg)    Current diet: well balanced    Knee pain Patient reports that he is having pain in both knees that has been ongoing for several months. He reports that he has seen ortho for this, and had to have injections in both knees, but it has not helped. He reports that he experiences pain when he goes from sitting to standing.   No Known Allergies   Current Outpatient Medications:  .  Ascorbic Acid (VITAMIN C) 1000 MG tablet, Take 1,000 mg by mouth daily., Disp: , Rfl:  .  aspirin EC 81 MG tablet, Take 1 tablet (81 mg total) by mouth daily., Disp: 90 tablet, Rfl: 3 .  B Complex Vitamins (VITAMIN B COMPLEX PO), Take 1 tablet by mouth daily., Disp: , Rfl:  .  beta carotene 25000 UNIT capsule, Take 25,000 Units by mouth daily., Disp: , Rfl:  .  celecoxib (CELEBREX) 200 MG capsule, TAKE 1 CAPSULE BY MOUTH EVERY DAY, Disp: 90 capsule, Rfl: 3 .  cholecalciferol (VITAMIN D)  1000 UNITS tablet, Take 1,000 Units by mouth daily., Disp: , Rfl:  .  Cyanocobalamin (B-12) 1000 MCG CAPS, 1 capsule once daily, Disp: 30 capsule, Rfl: 0 .  fish oil-omega-3 fatty acids 1000 MG capsule, Take 1 g by mouth daily., Disp: , Rfl:  .  folic acid (FOLVITE) 382 MCG tablet, Take 1 tablet (400 mcg total) by mouth daily., Disp: 30 tablet, Rfl: 0 .  GLUCOSAMINE-CHONDROIT-VIT C-MN PO, Take by mouth daily. , Disp: , Rfl:  .  losartan (COZAAR) 25 MG tablet, Take 1 tablet (25 mg total) by mouth daily., Disp: 90 tablet, Rfl: 3 .  metoprolol succinate (TOPROL-XL) 50 MG 24 hr tablet, Take 1/2 tablet (25 mg) by mouth once a day. Take with or immediately following a meal., Disp: 90 tablet, Rfl: 3 .  metoprolol succinate (TOPROL-XL) 50 MG 24 hr tablet, TAKE 1 TABLET BY MOUTH ONCE DAILY, WITH OR IMMEDIATELY FOLLOWING A MEAL, Disp: 90 tablet, Rfl: 3 .  Multiple Vitamin (MULTIVITAMIN WITH MINERALS) TABS tablet, Take 1 tablet by mouth daily., Disp: , Rfl:  .  oxyCODONE (ROXICODONE) 15 MG immediate release tablet, Take 1 tablet (15 mg total) by mouth every 6 (six) hours as needed for pain., Disp: 120 tablet, Rfl: 0 .  rosuvastatin (CRESTOR) 5 MG tablet, TAKE 1 TABLET(5 MG) BY  MOUTH DAILY, Disp: 90 tablet, Rfl: 3 .  Selenium 200 MCG TABS, Take 200 mcg by mouth daily., Disp: , Rfl:  .  tadalafil (CIALIS) 5 MG tablet, ! Table daily for BPH, not ED, Disp: 90 tablet, Rfl: 3 .  oxyCODONE (ROXICODONE) 15 MG immediate release tablet, Take 1 tablet (15 mg total) by mouth every 6 (six) hours as needed for pain. (Patient not taking: Reported on 06/14/2018), Disp: 120 tablet, Rfl: 0 .  oxyCODONE (ROXICODONE) 15 MG immediate release tablet, Take 1 tablet (15 mg total) by mouth every 6 (six) hours as needed for pain. (Patient not taking: Reported on 06/14/2018), Disp: 120 tablet, Rfl: 0  Review of Systems  Constitutional: Negative.   HENT: Negative.   Eyes: Negative.   Respiratory: Negative.   Cardiovascular: Negative.     Gastrointestinal: Negative.   Endocrine: Negative.   Genitourinary: Negative.   Musculoskeletal: Positive for arthralgias, back pain and myalgias.  Skin: Negative.   Allergic/Immunologic: Negative.   Neurological: Negative.   Hematological: Negative.   Psychiatric/Behavioral: Negative.     Social History   Tobacco Use  . Smoking status: Former Smoker    Types: Cigarettes    Last attempt to quit: 10/19/1977    Years since quitting: 40.8  . Smokeless tobacco: Current User    Types: Chew  Substance Use Topics  . Alcohol use: Yes    Alcohol/week: 0.0 standard drinks    Comment: very seldom- wine   Objective:   BP (!) 110/58 (BP Location: Left Arm, Patient Position: Sitting, Cuff Size: Normal)   Pulse 62   Temp 99 F (37.2 C)   Resp 16   Ht 6' (1.829 m)   Wt 163 lb (73.9 kg)   SpO2 99%   BMI 22.11 kg/m  Vitals:   08/26/18 1043  BP: (!) 110/58  Pulse: 62  Resp: 16  Temp: 99 F (37.2 C)  SpO2: 99%  Weight: 163 lb (73.9 kg)  Height: 6' (1.829 m)     Physical Exam  Constitutional: He is oriented to person, place, and time. He appears well-developed and well-nourished.  HENT:  Head: Normocephalic and atraumatic.  Right Ear: External ear normal.  Left Ear: External ear normal.  Nose: Nose normal.  Eyes: Conjunctivae are normal. No scleral icterus.  Neck: No thyromegaly present.  Cardiovascular: Normal rate, regular rhythm and normal heart sounds.  Pulmonary/Chest: Effort normal and breath sounds normal.  Abdominal: Soft.  Musculoskeletal: He exhibits no edema.  Neurological: He is alert and oriented to person, place, and time.  Skin: Skin is warm and dry.  Psychiatric: He has a normal mood and affect. His behavior is normal. Judgment and thought content normal.        Assessment & Plan:     1. Anemia, unspecified type  - CBC with Differential/Platelet  2. Other chronic pain   - oxyCODONE (ROXICODONE) 15 MG immediate release tablet; Take 1 tablet (15  mg total) by mouth every 6 (six) hours as needed for pain.  Dispense: 120 tablet; Refill: 0 - oxyCODONE (ROXICODONE) 15 MG immediate release tablet; Take 1 tablet (15 mg total) by mouth every 6 (six) hours as needed for pain.  Dispense: 120 tablet; Refill: 0  3. Arthritis Pt with Knee and shoulder OA issues. May need surgeries.  4. Essential hypertension   5. Pure hypercholesterolemia       I have done the exam and reviewed the above chart and it is accurate to the best of my  knowledge. Development worker, community has been used in this note in any air is in the dictation or transcription are unintentional.  [name], MD  [place]

## 2018-08-27 LAB — CBC WITH DIFFERENTIAL/PLATELET
Basophils Absolute: 0 10*3/uL (ref 0.0–0.2)
Basos: 0 %
EOS (ABSOLUTE): 0 10*3/uL (ref 0.0–0.4)
Eos: 1 %
Hematocrit: 35.9 % — ABNORMAL LOW (ref 37.5–51.0)
Hemoglobin: 12.1 g/dL — ABNORMAL LOW (ref 13.0–17.7)
Immature Grans (Abs): 0 10*3/uL (ref 0.0–0.1)
Immature Granulocytes: 0 %
Lymphocytes Absolute: 0.9 10*3/uL (ref 0.7–3.1)
Lymphs: 14 %
MCH: 35.1 pg — ABNORMAL HIGH (ref 26.6–33.0)
MCHC: 33.7 g/dL (ref 31.5–35.7)
MCV: 104 fL — ABNORMAL HIGH (ref 79–97)
Monocytes Absolute: 0.7 10*3/uL (ref 0.1–0.9)
Monocytes: 11 %
Neutrophils Absolute: 4.7 10*3/uL (ref 1.4–7.0)
Neutrophils: 74 %
Platelets: 173 10*3/uL (ref 150–450)
RBC: 3.45 x10E6/uL — ABNORMAL LOW (ref 4.14–5.80)
RDW: 12 % — ABNORMAL LOW (ref 12.3–15.4)
WBC: 6.3 10*3/uL (ref 3.4–10.8)

## 2018-08-30 ENCOUNTER — Telehealth: Payer: Self-pay | Admitting: Family Medicine

## 2018-08-30 DIAGNOSIS — D7589 Other specified diseases of blood and blood-forming organs: Secondary | ICD-10-CM

## 2018-08-30 NOTE — Telephone Encounter (Signed)
Pt wants to go ahead with being referred to a hematologist.  Pt also wants a call back to let him know.  Thanks, [employer]

## 2018-08-30 NOTE — Telephone Encounter (Signed)
Please advise 

## 2018-09-03 MED ORDER — OXYCODONE HCL 15 MG PO TABS: 15.0000 mg | ORAL_TABLET | Freq: Four times a day (QID) | ORAL | 0 refills | Status: DC | PRN

## 2018-09-21 ENCOUNTER — Telehealth: Payer: Self-pay | Admitting: Family Medicine

## 2018-09-21 NOTE — Telephone Encounter (Signed)
Pt is checking on the status of the referral or was Dr. [name] going to keep a check on his blood that was a concern from recent labs?  Please call pt back to advise him.  Thanks, [employer]

## 2018-09-21 NOTE — Telephone Encounter (Signed)
Dr. [name] please review over telephone encounter dated from 08/30/18. Patient di0d request than a referral to hematology, message was sent to you but I didn't see a ordered placed in chart for hematology referral. Can you please review chart and advise. KW

## 2018-09-22 NOTE — Telephone Encounter (Signed)
Refer to Hematology for Macrocytosis  With normal B12. thanks

## 2018-09-22 NOTE — Telephone Encounter (Signed)
Order was placed.

## 2018-09-27 NOTE — Telephone Encounter (Signed)
Order was sent on 09/22/18. Their office will contact patient.

## 2018-09-27 NOTE — Telephone Encounter (Signed)
Okay to refer to hematology for macrocytosis

## 2018-09-29 ENCOUNTER — Inpatient Hospital Stay: Payer: Medicare Other

## 2018-09-29 ENCOUNTER — Inpatient Hospital Stay: Payer: Medicare Other | Attending: Oncology | Admitting: Oncology

## 2018-09-29 ENCOUNTER — Other Ambulatory Visit: Payer: Self-pay | Admitting: Oncology

## 2018-09-29 ENCOUNTER — Encounter: Payer: Self-pay | Admitting: Oncology

## 2018-09-29 ENCOUNTER — Other Ambulatory Visit: Payer: Self-pay

## 2018-09-29 VITALS — BP 112/58 | HR 68 | Temp 96.6°F | Resp 18 | Ht 72.0 in | Wt 161.0 lb

## 2018-09-29 DIAGNOSIS — D539 Nutritional anemia, unspecified: Secondary | ICD-10-CM | POA: Diagnosis not present

## 2018-09-29 DIAGNOSIS — D7589 Other specified diseases of blood and blood-forming organs: Secondary | ICD-10-CM

## 2018-09-29 DIAGNOSIS — Z87891 Personal history of nicotine dependence: Secondary | ICD-10-CM | POA: Diagnosis not present

## 2018-09-29 LAB — CBC WITH DIFFERENTIAL/PLATELET
Abs Immature Granulocytes: 0.02 10*3/uL (ref 0.00–0.07)
Basophils Absolute: 0 10*3/uL (ref 0.0–0.1)
Basophils Relative: 1 %
Eosinophils Absolute: 0 10*3/uL (ref 0.0–0.5)
Eosinophils Relative: 1 %
HCT: 36.5 % — ABNORMAL LOW (ref 39.0–52.0)
Hemoglobin: 12.2 g/dL — ABNORMAL LOW (ref 13.0–17.0)
Immature Granulocytes: 0 %
Lymphocytes Relative: 15 %
Lymphs Abs: 0.9 10*3/uL (ref 0.7–4.0)
MCH: 34.8 pg — ABNORMAL HIGH (ref 26.0–34.0)
MCHC: 33.4 g/dL (ref 30.0–36.0)
MCV: 104 fL — ABNORMAL HIGH (ref 80.0–100.0)
Monocytes Absolute: 0.7 10*3/uL (ref 0.1–1.0)
Monocytes Relative: 12 %
Neutro Abs: 4.1 10*3/uL (ref 1.7–7.7)
Neutrophils Relative %: 71 %
Platelets: 160 10*3/uL (ref 150–400)
RBC: 3.51 MIL/uL — ABNORMAL LOW (ref 4.22–5.81)
RDW: 12.4 % (ref 11.5–15.5)
WBC: 5.7 10*3/uL (ref 4.0–10.5)
nRBC: 0 % (ref 0.0–0.2)

## 2018-09-29 LAB — RETIC PANEL
Immature Retic Fract: 5.7 % (ref 2.3–15.9)
RBC.: 3.51 MIL/uL — ABNORMAL LOW (ref 4.22–5.81)
Retic Count, Absolute: 38.3 10*3/uL (ref 19.0–186.0)
Retic Ct Pct: 1.1 % (ref 0.4–3.1)
Reticulocyte Hemoglobin: 37.9 pg (ref >27.9)

## 2018-09-29 LAB — TECHNOLOGIST SMEAR REVIEW: Plt Morphology: NORMAL

## 2018-09-29 LAB — LACTATE DEHYDROGENASE: LDH: 130 U/L (ref 98–192)

## 2018-09-29 NOTE — Progress Notes (Signed)
Hematology/Oncology Consult note [facility] [phone] [phone]   Patient Care Team: Jerrol Banana., MD as PCP - General (Family Medicine) Anell Barr, OD as Consulting Physician (Optometry) Kristeen Miss, MD as Consulting Physician (Neurosurgery) Marry Guan, Laurice Record, MD (Orthopedic Surgery)  REFERRING PROVIDER: Jerrol Banana., MD CHIEF COMPLAINTS/REASON FOR VISIT:  Evaluation of macrocytosis  HISTORY OF PRESENTING ILLNESS:  Daniel Owens is a  72 y.o.  male with PMH listed below who was referred to me for evaluation of macrocytosis Patient has had lab work-up done in November 2019 at PCPs office.  CBC showed hemoglobin 12.1, MCV 104.  WBC 6.3, platelet counts 1 73,000.  Normal differential Reviewed patient's past CBC.  Macrocytosis, chronic onset, since at least 2016.  His hemoglobin 2016 was normal.  Hemoglobin started to trend down since July 2019. Denies hematochezia, hematuria, hematemesis, epistaxis, black tarry stool or easy bruising.  Folate and B12 level has been checked in July 2019 and has been normal. Denies any fever, chills, fatigue, unintentional weight loss.    Review of Systems  Constitutional: Negative for appetite change, chills, fatigue, fever and unexpected weight change.  HENT:   Negative for hearing loss and voice change.   Eyes: Negative for eye problems and icterus.  Respiratory: Negative for chest tightness, cough and shortness of breath.   Cardiovascular: Negative for chest pain and leg swelling.  Gastrointestinal: Negative for abdominal distention and abdominal pain.  Endocrine: Negative for hot flashes.  Genitourinary: Negative for difficulty urinating, dysuria and frequency.   Musculoskeletal: Negative for arthralgias.  Skin: Negative for itching and rash.  Neurological: Negative for light-headedness and numbness.  Hematological: Negative for adenopathy. Does not bruise/bleed easily.    Psychiatric/Behavioral: Negative for confusion.    MEDICAL HISTORY:  Past Medical History:  Diagnosis Date  . Abnormal stress test    a. 07/2017 ETT: Ex 10:15, no chest pain, HTN response, 25m ST dep in II, II, aVF, V4-V6 - returned to baseline in 5 mins-->Intermediate risk study.  . Aortic atherosclerosis ([facility]    a. Incidentally noted on Chest CT in 2016.  . [place] arterial [place] ([place]    a. 07/2014 carotid u/s: mild <50%, bilat ICA stenosis.  . Hypertension   . osteoarthritis   . Palpitations    a. ? prior dx of PAF.  .Marland KitchenRaynauds syndrome   . Systolic murmur    a. Reports h/o rheumatic fever as a child;  b. 07/2017 Echo: EF 55-60%, no rwma, triv AI, nl RV fxn.    SURGICAL HISTORY: Past Surgical History:  Procedure Laterality Date  . APPENDECTOMY    . BILATERAL KNEE ARTHROSCOPY    . HERNIA REPAIR    . NECK SURGERY     involving disc  . NOSE SURGERY    . RETINAL DETACHMENT SURGERY    . SHOULDER SURGERY     Right-x 3 overall  . TENDON REPAIR     involving Tricep tendon    SOCIAL HISTORY: Social History   Socioeconomic History  . Marital status: Married    Spouse name: Not on file  . Number of children: 2  . Years of education: Not on file  . Highest education level: Associate degree: academic program  Occupational History  . Occupation: retired  SScientific laboratory technician . Financial resource strain: Not hard at all  . Food insecurity:    Worry: Never true    Inability: Never true  . Transportation needs:  Medical: No    Non-medical: No  Tobacco Use  . Smoking status: Former Smoker    Types: Cigarettes    Last attempt to quit: 10/19/1977    Years since quitting: 40.9  . Smokeless tobacco: Current User    Types: Chew  Substance and Sexual Activity  . Alcohol use: Yes    Alcohol/week: 0.0 standard drinks    Comment: very seldom- wine  . Drug use: No  . Sexual activity: Not on file  Lifestyle  . Physical activity:    Days per week: Not on file    Minutes  per session: Not on file  . Stress: Only a little  Relationships  . Social connections:    Talks on phone: Not on file    Gets together: Not on file    Attends religious service: Not on file    Active member of club or organization: Not on file    Attends meetings of clubs or organizations: Not on file    Relationship status: Not on file  . Intimate partner violence:    Fear of current or ex partner: Not on file    Emotionally abused: Not on file    Physically abused: Not on file    Forced sexual activity: Not on file  Other Topics Concern  . Not on file  Social History Narrative  . Not on file    FAMILY HISTORY: Family History  Problem Relation Age of Onset  . Stroke Mother   . Uterine cancer Mother   . Throat cancer Father   . Allergic rhinitis Brother   . Prostate cancer Maternal Uncle   . Prostate cancer Paternal Uncle   . Heart attack Maternal Grandmother   . Heart attack Maternal Grandfather     ALLERGIES:  has No Known Allergies.  MEDICATIONS:  Current Outpatient Medications  Medication Sig Dispense Refill  . Ascorbic Acid (VITAMIN C) 1000 MG tablet Take 1,000 mg by mouth daily.    Marland Kitchen aspirin EC 81 MG tablet Take 1 tablet (81 mg total) by mouth daily. 90 tablet 3  . B Complex Vitamins (VITAMIN B COMPLEX PO) Take 1 tablet by mouth daily.    . beta carotene 25000 UNIT capsule Take 25,000 Units by mouth daily.    . celecoxib (CELEBREX) 200 MG capsule TAKE 1 CAPSULE BY MOUTH EVERY DAY (Patient taking differently: daily as needed. ) 90 capsule 3  . fish oil-omega-3 fatty acids 1000 MG capsule Take 1 g by mouth daily.    . folic acid (FOLVITE) 932 MCG tablet Take 1 tablet (400 mcg total) by mouth daily. 30 tablet 0  . GLUCOSAMINE-CHONDROIT-VIT C-MN PO Take by mouth daily.     Marland Kitchen losartan (COZAAR) 25 MG tablet Take 1 tablet (25 mg total) by mouth daily. 90 tablet 3  . metoprolol succinate (TOPROL-XL) 50 MG 24 hr tablet Take 1/2 tablet (25 mg) by mouth once a day. Take  with or immediately following a meal. 90 tablet 3  . Multiple Vitamin (MULTIVITAMIN WITH MINERALS) TABS tablet Take 1 tablet by mouth daily.    Marland Kitchen oxyCODONE (ROXICODONE) 15 MG immediate release tablet Take 1 tablet (15 mg total) by mouth every 6 (six) hours as needed for pain. 120 tablet 0  . rosuvastatin (CRESTOR) 5 MG tablet TAKE 1 TABLET(5 MG) BY MOUTH DAILY 90 tablet 3  . Selenium 200 MCG TABS Take 200 mcg by mouth daily.    . tadalafil (CIALIS) 5 MG tablet ! Table daily for  BPH, not ED 90 tablet 3   No current facility-administered medications for this visit.      PHYSICAL EXAMINATION: ECOG PERFORMANCE STATUS: 0 - Asymptomatic Vitals:   09/30/18 1244  BP: (!) 112/58  Pulse: 68  Resp: 18  Temp: (!) 96.6 F (35.9 C)   Filed Weights   09/30/18 1244  Weight: 161 lb (73 kg)    Physical Exam  Constitutional: He is oriented to person, place, and time. No distress.  HENT:  Head: Normocephalic and atraumatic.  Mouth/Throat: Oropharynx is clear and moist.  Eyes: Pupils are equal, round, and reactive to light. EOM are normal. No scleral icterus.  Neck: Normal range of motion. Neck supple.  Cardiovascular: Normal rate, regular rhythm and normal heart sounds.  Pulmonary/Chest: Effort normal. No respiratory distress. He has no wheezes.  Abdominal: Soft. Bowel sounds are normal. He exhibits no distension and no mass. There is no tenderness.  Musculoskeletal: Normal range of motion. He exhibits no edema or deformity.  Neurological: He is alert and oriented to person, place, and time. No cranial nerve deficit. Coordination normal.  Skin: Skin is warm and dry. No rash noted. No erythema.  Psychiatric: He has a normal mood and affect. His behavior is normal. Thought content normal.     LABORATORY DATA:  I have reviewed the data as listed Lab Results  Component Value Date   WBC 5.7 09/29/2018   HGB 12.2 (L) 09/29/2018   HCT 36.5 (L) 09/29/2018   MCV 104.0 (H) 09/29/2018   PLT 160  09/29/2018   Recent Labs    05/04/18 0905  NA 140  K 4.0  CL 101  CO2 25  GLUCOSE 82  BUN 24  CREATININE 1.00  CALCIUM 9.3  GFRNONAA 76  GFRAA 88  PROT 6.4  ALBUMIN 4.5  AST 22  ALT 18  ALKPHOS 47  BILITOT 0.4   Iron/TIBC/Ferritin/ %Sat No results found for: IRON, TIBC, FERRITIN, IRONPCTSAT      ASSESSMENT & PLAN:  1. Macrocytic anemia    Discussed with patient that microcytic anemia can be secondary to multiple etiologies.  Including vitamin B-12 or folate deficiency, hemolysis, or underlying bone marrow disease. His ankle has been checked 6 months ago and levels were normal.  He occasionally drinks wine. Reviewed patient's medication list and I do not see any medication potentially will increase his MCVs. I recommend check CBC, smear review, LDH, haptoglobin, flow cytometry, multiple myeloma panel.  Orders Placed This Encounter  Procedures  . CBC with Differential/Platelet    Standing Status:   Future    Number of Occurrences:   1    Standing Expiration Date:   09/30/2019  . Retic Panel    Standing Status:   Future    Number of Occurrences:   1    Standing Expiration Date:   09/30/2019  . Lactate dehydrogenase    Standing Status:   Future    Number of Occurrences:   1    Standing Expiration Date:   09/30/2019  . Haptoglobin    Standing Status:   Future    Number of Occurrences:   1    Standing Expiration Date:   09/30/2019  . Multiple Myeloma Panel (SPEP&IFE w/QIG)    Standing Status:   Future    Number of Occurrences:   1    Standing Expiration Date:   09/29/2019  . Kappa/lambda light chains    Standing Status:   Future    Number of Occurrences:  1    Standing Expiration Date:   09/29/2019  . Flow cytometry panel-leukemia/lymphoma work-up    Standing Status:   Future    Number of Occurrences:   1    Standing Expiration Date:   09/30/2019  . Technologist smear review    Standing Status:   Future    Number of Occurrences:   1    Standing  Expiration Date:   09/30/2019    All questions were answered. The patient knows to call the clinic with any problems questions or concerns.  Return of visit: 3 weeks Thank you for this kind referral and the opportunity to participate in the care of this patient. A copy of today's note is routed to referring provider  Total face to face encounter time for this patient visit was 45 min. >50% of the time was  spent in counseling and coordination of care.    Earlie Server, MD, PhD Hematology Oncology [facility] Pager- [phone] 09/29/2018

## 2018-09-29 NOTE — Progress Notes (Signed)
Patient here for initial visit. °

## 2018-09-30 ENCOUNTER — Other Ambulatory Visit: Payer: Self-pay | Admitting: *Deleted

## 2018-09-30 LAB — KAPPA/LAMBDA LIGHT CHAINS
Kappa free light chain: 14.9 mg/L (ref 3.3–19.4)
Kappa, lambda light chain ratio: 1.07 (ref 0.26–1.65)
Lambda free light chains: 13.9 mg/L (ref 5.7–26.3)

## 2018-09-30 LAB — HAPTOGLOBIN: Haptoglobin: 117 mg/dL (ref 34–200)

## 2018-09-30 NOTE — Progress Notes (Signed)
Open in error

## 2018-10-04 LAB — COMP PANEL: LEUKEMIA/LYMPHOMA

## 2018-10-05 LAB — MULTIPLE MYELOMA PANEL, SERUM
Albumin SerPl Elph-Mcnc: 4 g/dL (ref 2.9–4.4)
Albumin/Glob SerPl: 1.7 (ref 0.7–1.7)
Alpha 1: 0.2 g/dL (ref 0.0–0.4)
Alpha2 Glob SerPl Elph-Mcnc: 0.8 g/dL (ref 0.4–1.0)
B-Globulin SerPl Elph-Mcnc: 0.6 g/dL — ABNORMAL LOW (ref 0.7–1.3)
Gamma Glob SerPl Elph-Mcnc: 0.8 g/dL (ref 0.4–1.8)
Globulin, Total: 2.4 g/dL (ref 2.2–3.9)
IgA: 171 mg/dL (ref 61–437)
IgG (Immunoglobin G), Serum: 885 mg/dL (ref 700–1600)
IgM (Immunoglobulin M), Srm: 146 mg/dL — ABNORMAL HIGH (ref 15–143)
Total Protein ELP: 6.4 g/dL (ref 6.0–8.5)

## 2018-10-21 ENCOUNTER — Encounter: Payer: Self-pay | Admitting: Oncology

## 2018-10-21 ENCOUNTER — Other Ambulatory Visit: Payer: Self-pay

## 2018-10-21 ENCOUNTER — Inpatient Hospital Stay: Payer: Medicare Other | Attending: Oncology | Admitting: Oncology

## 2018-10-21 VITALS — BP 148/72 | HR 66 | Temp 97.3°F | Resp 18 | Wt 158.5 lb

## 2018-10-21 DIAGNOSIS — Z87891 Personal history of nicotine dependence: Secondary | ICD-10-CM | POA: Insufficient documentation

## 2018-10-21 DIAGNOSIS — D539 Nutritional anemia, unspecified: Secondary | ICD-10-CM

## 2018-10-21 DIAGNOSIS — Z79899 Other long term (current) drug therapy: Secondary | ICD-10-CM | POA: Insufficient documentation

## 2018-10-21 DIAGNOSIS — D509 Iron deficiency anemia, unspecified: Secondary | ICD-10-CM | POA: Diagnosis not present

## 2018-10-21 DIAGNOSIS — Z7982 Long term (current) use of aspirin: Secondary | ICD-10-CM | POA: Insufficient documentation

## 2018-10-21 DIAGNOSIS — I480 Paroxysmal atrial fibrillation: Secondary | ICD-10-CM | POA: Insufficient documentation

## 2018-10-21 DIAGNOSIS — I10 Essential (primary) hypertension: Secondary | ICD-10-CM | POA: Diagnosis not present

## 2018-10-21 NOTE — Progress Notes (Signed)
Hematology/Oncology Consult note [facility] Telephone:([phone] [phone]   Patient Care Team: Jerrol Banana., MD as PCP - General (Family Medicine) Anell Barr, OD as Consulting Physician (Optometry) Kristeen Miss, MD as Consulting Physician (Neurosurgery) Marry Guan, Laurice Record, MD (Orthopedic Surgery)  REFERRING PROVIDER: Jerrol Banana., MD CHIEF COMPLAINTS/REASON FOR VISIT:  Evaluation of macrocytosis  HISTORY OF PRESENTING ILLNESS:  Daniel Owens is a  73 y.o.  male with PMH listed below who was referred to me for evaluation of macrocytosis Patient has had lab work-up done in November 2019 at PCPs office.  CBC showed hemoglobin 12.1, MCV 104.  WBC 6.3, platelet counts 1 73,000.  Normal differential Reviewed patient's past CBC.  Macrocytosis, chronic onset, since at least 2016.  His hemoglobin 2016 was normal.  Hemoglobin started to trend down since July 2019. Denies hematochezia, hematuria, hematemesis, epistaxis, black tarry stool or easy bruising.  Folate and B12 level has been checked in July 2019 and has been normal. Denies any fever, chills, fatigue, unintentional weight loss.  INTERVAL HISTORY Daniel Owens is a 73 y.o. male who has above history reviewed by me today presents for follow up visit for management of microcytic anemia. Patient has had lab work-up done and present for discussion. No new complaints.    Review of Systems  Constitutional: Negative for appetite change, chills, fatigue, fever and unexpected weight change.  HENT:   Negative for hearing loss and voice change.   Eyes: Negative for eye problems and icterus.  Respiratory: Negative for chest tightness, cough and shortness of breath.   Cardiovascular: Negative for chest pain and leg swelling.  Gastrointestinal: Negative for abdominal distention and abdominal pain.  Endocrine: Negative for hot flashes.  Genitourinary: Negative for difficulty urinating,  dysuria and frequency.   Musculoskeletal: Negative for arthralgias.  Skin: Negative for itching and rash.  Neurological: Negative for light-headedness and numbness.  Hematological: Negative for adenopathy. Does not bruise/bleed easily.  Psychiatric/Behavioral: Negative for confusion.    MEDICAL HISTORY:  Past Medical History:  Diagnosis Date  . Abnormal stress test    a. 07/2017 ETT: Ex 10:15, no chest pain, HTN response, 32m ST dep in II, II, aVF, V4-V6 - returned to baseline in 5 mins-->Intermediate risk study.  . Aortic atherosclerosis ([place]    a. Incidentally noted on Chest CT in 2016.  . Carotid arterial disease ([place]    a. 07/2014 carotid u/s: mild <50%, bilat ICA stenosis.  . Hypertension   . osteoarthritis   . Palpitations    a. ? prior dx of PAF.  .Marland KitchenRaynauds syndrome   . Systolic murmur    a. Reports h/o rheumatic fever as a child;  b. 07/2017 Echo: EF 55-60%, no rwma, triv AI, nl RV fxn.    SURGICAL HISTORY: Past Surgical History:  Procedure Laterality Date  . APPENDECTOMY    . BILATERAL KNEE ARTHROSCOPY    . HERNIA REPAIR    . NECK SURGERY     involving disc  . NOSE SURGERY    . RETINAL DETACHMENT SURGERY    . SHOULDER SURGERY     Right-x 3 overall  . TENDON REPAIR     involving Tricep tendon    SOCIAL HISTORY: Social History   Socioeconomic History  . Marital status: Married    Spouse name: Not on file  . Number of children: 2  . Years of education: Not on file  . Highest education level: Associate degree: academic program  Occupational History  . Occupation: retired  Scientific laboratory technician  . Financial resource strain: Not hard at all  . Food insecurity:    Worry: Never true    Inability: Never true  . Transportation needs:    Medical: No    Non-medical: No  Tobacco Use  . Smoking status: Former Smoker    Types: Cigarettes    Last attempt to quit: 10/19/1977    Years since quitting: 41.0  . Smokeless tobacco: Current User    Types: Chew    Substance and Sexual Activity  . Alcohol use: Yes    Alcohol/week: 0.0 standard drinks    Comment: very seldom- wine  . Drug use: No  . Sexual activity: Not on file  Lifestyle  . Physical activity:    Days per week: Not on file    Minutes per session: Not on file  . Stress: Only a little  Relationships  . Social connections:    Talks on phone: Not on file    Gets together: Not on file    Attends religious service: Not on file    Active member of club or organization: Not on file    Attends meetings of clubs or organizations: Not on file    Relationship status: Not on file  . Intimate partner violence:    Fear of current or ex partner: Not on file    Emotionally abused: Not on file    Physically abused: Not on file    Forced sexual activity: Not on file  Other Topics Concern  . Not on file  Social History Narrative  . Not on file    FAMILY HISTORY: Family History  Problem Relation Age of Onset  . Stroke Mother   . Uterine cancer Mother   . Throat cancer Father   . Allergic rhinitis Brother   . Prostate cancer Maternal Uncle   . Prostate cancer Paternal Uncle   . Heart attack Maternal Grandmother   . Heart attack Maternal Grandfather     ALLERGIES:  has No Known Allergies.  MEDICATIONS:  Current Outpatient Medications  Medication Sig Dispense Refill  . Ascorbic Acid (VITAMIN C) 1000 MG tablet Take 1,000 mg by mouth daily.    Marland Kitchen aspirin EC 81 MG tablet Take 1 tablet (81 mg total) by mouth daily. 90 tablet 3  . B Complex Vitamins (VITAMIN B COMPLEX PO) Take 1 tablet by mouth daily.    . beta carotene 25000 UNIT capsule Take 25,000 Units by mouth daily.    . celecoxib (CELEBREX) 200 MG capsule TAKE 1 CAPSULE BY MOUTH EVERY DAY (Patient taking differently: daily as needed. ) 90 capsule 3  . fish oil-omega-3 fatty acids 1000 MG capsule Take 1 g by mouth daily.    . folic acid (FOLVITE) 811 MCG tablet Take 1 tablet (400 mcg total) by mouth daily. 30 tablet 0  .  GLUCOSAMINE-CHONDROIT-VIT C-MN PO Take by mouth daily.     Marland Kitchen losartan (COZAAR) 25 MG tablet Take 1 tablet (25 mg total) by mouth daily. 90 tablet 3  . metoprolol succinate (TOPROL-XL) 50 MG 24 hr tablet Take 1/2 tablet (25 mg) by mouth once a day. Take with or immediately following a meal. 90 tablet 3  . Multiple Vitamin (MULTIVITAMIN WITH MINERALS) TABS tablet Take 1 tablet by mouth daily.    Marland Kitchen oxyCODONE (ROXICODONE) 15 MG immediate release tablet Take 1 tablet (15 mg total) by mouth every 6 (six) hours as needed for pain. 120 tablet 0  .  rosuvastatin (CRESTOR) 5 MG tablet TAKE 1 TABLET(5 MG) BY MOUTH DAILY 90 tablet 3  . Selenium 200 MCG TABS Take 200 mcg by mouth daily.    . tadalafil (CIALIS) 5 MG tablet ! Table daily for BPH, not ED 90 tablet 3   No current facility-administered medications for this visit.      PHYSICAL EXAMINATION: ECOG PERFORMANCE STATUS: 0 - Asymptomatic Vitals:   10/21/18 1020  BP: (!) 148/72  Pulse: 66  Resp: 18  Temp: (!) 97.3 F (36.3 C)   Filed Weights   10/21/18 1020  Weight: 158 lb 8.2 oz (71.9 kg)    Physical Exam Constitutional:      General: He is not in acute distress. HENT:     Head: Normocephalic and atraumatic.  Eyes:     General: No scleral icterus.    Pupils: Pupils are equal, round, and reactive to light.  Neck:     Musculoskeletal: Normal range of motion and neck supple.  Cardiovascular:     Rate and Rhythm: Normal rate and regular rhythm.     Heart sounds: Normal heart sounds.  Pulmonary:     Effort: Pulmonary effort is normal. No respiratory distress.     Breath sounds: No wheezing.  Abdominal:     General: Bowel sounds are normal. There is no distension.     Palpations: Abdomen is soft. There is no mass.     Tenderness: There is no abdominal tenderness.  Musculoskeletal: Normal range of motion.        General: No deformity.  Skin:    General: Skin is warm and dry.     Findings: No erythema or rash.  Neurological:      Mental Status: He is alert and oriented to person, place, and time.     Cranial Nerves: No cranial nerve deficit.     Coordination: Coordination normal.  Psychiatric:        Behavior: Behavior normal.        Thought Content: Thought content normal.      LABORATORY DATA:  I have reviewed the data as listed Lab Results  Component Value Date   WBC 5.7 09/29/2018   HGB 12.2 (L) 09/29/2018   HCT 36.5 (L) 09/29/2018   MCV 104.0 (H) 09/29/2018   PLT 160 09/29/2018   Recent Labs    05/04/18 0905  NA 140  K 4.0  CL 101  CO2 25  GLUCOSE 82  BUN 24  CREATININE 1.00  CALCIUM 9.3  GFRNONAA 76  GFRAA 88  PROT 6.4  ALBUMIN 4.5  AST 22  ALT 18  ALKPHOS 47  BILITOT 0.4   Lab Results  Component Value Date   TOTALPROTELP 6.4 09/29/2018   Lab Results  Component Value Date   KPAFRELGTCHN 14.9 09/29/2018   LAMBDASER 13.9 09/29/2018   KAPLAMBRATIO 1.07 09/29/2018     08/01/2015 vitamin B12 946 05/04/2018 vitamin B12 1398 05/04/2018 normal TSH 06/12/2017, hepatitis C less than 0.1 05/04/2018 TSH 2.25  09/29/2018  flow cytometry showed no significant immunophenotypic abnormality detected. Normal haptoglobin Normal LDH No remarkable findings multiple myeloma panel, mildly elevated IgM  ASSESSMENT & PLAN:  1. Macrocytic anemia    Labs reviewed and discussed with patient. So far work-up results not explained macrocytic anemia. Reviewed patient's medication list and I do not see any medication potentially will increase his MCVs. Discussed with patient about other etiologies of microcytic anemia including hereditary conditions, chronic liver disease, Underlying MDS is a concern.  Recommend  check additional labs with ultrasound liver, copper level.  And also a bone marrow biopsy for further evaluation.  Patient is very anxious and ask a lot of questions, I tried my best answering to his satisfaction. Also discussed with option of obtaining a second opinion from tertiary  center. Patient prefers to seek second opinion at [facility].  Will refer.   All questions were answered. The patient knows to call the clinic with any problems questions or concerns.  Return of visit: Patient going to [facility] for second opinion.  He is going to call cancer center if he wants to continue his care at [facility]. Total face to face encounter time for this patient visit was 25 min. >50% of the time was  spent in counseling and coordination of care.   Earlie Server, MD, PhD Hematology Oncology [facility] Pager- [phone] 10/21/2018

## 2018-10-21 NOTE — Progress Notes (Signed)
Patient here for follow up. Pt concerned about easy bruising.

## 2018-10-22 ENCOUNTER — Encounter (INDEPENDENT_AMBULATORY_CARE_PROVIDER_SITE_OTHER): Payer: Self-pay

## 2018-10-22 DIAGNOSIS — I10 Essential (primary) hypertension: Secondary | ICD-10-CM | POA: Diagnosis not present

## 2018-10-22 DIAGNOSIS — J018 Other acute sinusitis: Secondary | ICD-10-CM | POA: Diagnosis not present

## 2018-10-22 DIAGNOSIS — E7800 Pure hypercholesterolemia, unspecified: Secondary | ICD-10-CM | POA: Diagnosis not present

## 2018-10-23 ENCOUNTER — Other Ambulatory Visit: Payer: Self-pay | Admitting: Family Medicine

## 2018-10-23 DIAGNOSIS — R351 Nocturia: Secondary | ICD-10-CM

## 2018-10-29 ENCOUNTER — Telehealth: Payer: Self-pay | Admitting: *Deleted

## 2018-10-29 NOTE — Telephone Encounter (Signed)
Referral sent on 1/8. I called patient and notified him.

## 2018-10-29 NOTE — Telephone Encounter (Addendum)
Patient called reporting that he has not heard back from [facility] and he was told to call if he didn't hear from them. Please cehck into this and return his call.

## 2018-11-01 ENCOUNTER — Other Ambulatory Visit: Payer: Self-pay | Admitting: Family Medicine

## 2018-11-01 DIAGNOSIS — G8929 Other chronic pain: Secondary | ICD-10-CM

## 2018-11-01 NOTE — Telephone Encounter (Signed)
Patient needs refill on Oxycodone 15 mg. Sent to [facility] in [place]

## 2018-11-01 NOTE — Telephone Encounter (Signed)
Please review. Thanks!  

## 2018-11-02 ENCOUNTER — Telehealth: Payer: Self-pay | Admitting: *Deleted

## 2018-11-02 MED ORDER — OXYCODONE HCL 15 MG PO TABS: 15.0000 mg | ORAL_TABLET | Freq: Four times a day (QID) | ORAL | 0 refills | Status: DC | PRN

## 2018-11-02 NOTE — Telephone Encounter (Addendum)
Patient called and reports that that appointment at [facility] is scheduled for June 2020 and he feels this is too far in the future. He is asking for assistance getting seen sooner. Please advise

## 2018-11-02 NOTE — Telephone Encounter (Signed)
if he would like to be referred to [facility]. If yes, please send a referral.  Thanks.

## 2018-11-03 NOTE — Telephone Encounter (Signed)
Patient called Daniel Owens and appointment was rescheduled to January 23 at 12pm.

## 2018-11-09 DIAGNOSIS — Z862 Personal history of diseases of the blood and blood-forming organs and certain disorders involving the immune mechanism: Secondary | ICD-10-CM | POA: Insufficient documentation

## 2018-11-09 DIAGNOSIS — D649 Anemia, unspecified: Secondary | ICD-10-CM | POA: Insufficient documentation

## 2018-11-11 DIAGNOSIS — I700 Atherosclerosis of aorta: Secondary | ICD-10-CM | POA: Diagnosis not present

## 2018-11-11 DIAGNOSIS — M1990 Unspecified osteoarthritis, unspecified site: Secondary | ICD-10-CM | POA: Diagnosis not present

## 2018-11-11 DIAGNOSIS — I6523 Occlusion and stenosis of bilateral carotid arteries: Secondary | ICD-10-CM | POA: Diagnosis not present

## 2018-11-11 DIAGNOSIS — Z862 Personal history of diseases of the blood and blood-forming organs and certain disorders involving the immune mechanism: Secondary | ICD-10-CM | POA: Diagnosis not present

## 2018-11-11 DIAGNOSIS — I7300 Raynaud's syndrome without gangrene: Secondary | ICD-10-CM | POA: Diagnosis not present

## 2018-11-11 DIAGNOSIS — D649 Anemia, unspecified: Secondary | ICD-10-CM | POA: Diagnosis not present

## 2018-11-11 DIAGNOSIS — I10 Essential (primary) hypertension: Secondary | ICD-10-CM | POA: Diagnosis not present

## 2018-11-11 DIAGNOSIS — Z87891 Personal history of nicotine dependence: Secondary | ICD-10-CM | POA: Diagnosis not present

## 2018-11-12 ENCOUNTER — Telehealth: Payer: Self-pay | Admitting: Family Medicine

## 2018-11-12 NOTE — Telephone Encounter (Signed)
Dr. Annabelle Harman - Hematologist w/ [facility] is suggesting Dr. [name] test pt for stool for blood and  CT Scan of chest and stomach.  He was letting Dr. [name] know and what the next step to get this started.  Please advise.  Thanks, [employer]

## 2018-11-13 NOTE — Telephone Encounter (Signed)
Please review

## 2018-11-16 NOTE — Telephone Encounter (Signed)
Patient is calling to check on the status of this message.

## 2018-11-18 ENCOUNTER — Telehealth: Payer: Self-pay | Admitting: *Deleted

## 2018-11-18 NOTE — Telephone Encounter (Signed)
Patient has appointment next week

## 2018-11-18 NOTE — Telephone Encounter (Signed)
Patient called requesting we send recommendations from [facility] to Dr [name]. Per Care everywhere :  Recommendations for consideration at this time -stool test for occult blood -CT scan of chest abdomen and pelvis to rule out malignancy that may explain anemia -Consider colonoscopy depending on the results of the above studies -Mr. Alves stated that he would prefer to have his bone marrow biopsy at our hospital; once his evaluation has been completed and depending on the results, I am happy to schedule this for him in our clinic. Thank you for referring Mr. West to the [facility] hematology clinic. I am available to discuss his case anytime as needed by telephone [phone] [pager] or [phone] [cellular]  Murat O. Annabelle Harman, MD, FACP Professor of Medicine Division of Hematology [facility] of Medicine I personally performed the service. (TP) MURAT [name], MD    Electronically signed by Pieter Partridge, MD at 11/12/2018 9:51 AM EST

## 2018-11-18 NOTE — Telephone Encounter (Signed)
We can try to order the CT scan but he might be better off because calling the local hematologist who saw him to try to order this.  T of chest abdomen and pelvis with history of anemia.  I have not looked back to see when his last scope was.  He might need endoscopy and colonoscopy

## 2018-11-18 NOTE — Telephone Encounter (Signed)
I just opened the computer and checked everything on the computer on this patient.  I do not see anything from this doctor.  Cannot order test without seeing what he wants done.

## 2018-11-18 NOTE — Telephone Encounter (Signed)
Dr [name], I spoke with the patient and have advised him to call [facility] and ask them to fax [place] the office note with the information on what they wanted [place] to order.

## 2018-11-19 NOTE — Telephone Encounter (Signed)
Pt calling to check if office received the fax for the request from Dr. Annabelle Harman w/ [facility] for pt to have a stool test and CT Scanning.  Pt printed this off and will bring his copy to Monday's visit with Dr. [name].  Please advise.  Thanks, [employer]

## 2018-11-22 ENCOUNTER — Ambulatory Visit (INDEPENDENT_AMBULATORY_CARE_PROVIDER_SITE_OTHER): Payer: Medicare Other | Admitting: Family Medicine

## 2018-11-22 ENCOUNTER — Other Ambulatory Visit: Payer: Self-pay

## 2018-11-22 VITALS — BP 132/60 | HR 64 | Temp 98.2°F | Resp 14 | Wt 160.0 lb

## 2018-11-22 DIAGNOSIS — G8929 Other chronic pain: Secondary | ICD-10-CM

## 2018-11-22 DIAGNOSIS — D649 Anemia, unspecified: Secondary | ICD-10-CM

## 2018-11-22 DIAGNOSIS — D539 Nutritional anemia, unspecified: Secondary | ICD-10-CM

## 2018-11-22 MED ORDER — OXYCODONE HCL 15 MG PO TABS: 15.0000 mg | ORAL_TABLET | Freq: Four times a day (QID) | ORAL | 0 refills | Status: DC | PRN

## 2018-11-22 NOTE — Progress Notes (Signed)
Daniel Owens  MRN: [id number] DOB: 1946-02-06  Subjective:  HPI   Patient is a 73 year old male who presents for follow up of his chronic pain.  He was last seen on August 26, 2018.  The patient has been on narcotics for years after multiple surgeries. The patient would also like to discuss ordering tests that the oncologist would like to have done.  Patient states they woul like to get stool samples tested for blood and have some scans ordered. Note from [facility] oncology specified Hemoccult and CT of chest abdomen and pelvis.  For anemia.   Patient Active Problem List   Diagnosis Date Noted  . DOE (dyspnea on exertion) 07/17/2017  . Heart murmur 07/17/2017  . Abnormal EKG 07/17/2017  . Carotid artery disease ([place]) 07/17/2017  . Aortic atherosclerosis ([place]) 05/20/2017  . Hyperlipemia 05/20/2017  . BPH associated with nocturia 08/05/2016  . Esophageal reflux 03/18/2015  . Arthritis 03/18/2015  . Back pain, chronic 03/18/2015  . Cardiac conduction disorder 03/18/2015  . Narrowing of intervertebral disc space 03/18/2015  . Essential hypertension 03/18/2015  . History of anemia 03/18/2015  . History of atrial fibrillation 03/18/2015  . Weak pulse 03/18/2015  . Palpitations 03/18/2015  . Peyronie's disease 03/18/2015  . Raynaud's syndrome 03/18/2015  . Complete rotator cuff rupture of left shoulder 05/12/2013    Past Medical History:  Diagnosis Date  . Abnormal stress test    a. 07/2017 ETT: Ex 10:15, no chest pain, HTN response, 86mm ST dep in II, II, aVF, V4-V6 - returned to baseline in 5 mins-->Intermediate risk study.  . Aortic atherosclerosis ([place])    a. Incidentally noted on Chest CT in 2016.  . Carotid arterial disease ([place])    a. 07/2014 carotid u/s: mild <50%, bilat ICA stenosis.  . Hypertension   . osteoarthritis   . Palpitations    a. ? prior dx of PAF.  Marland Kitchen Raynauds syndrome   . Systolic murmur    a. Reports h/o rheumatic fever as a child;  b. 07/2017 Echo: EF  55-60%, no rwma, triv AI, nl RV fxn.    Social History   Socioeconomic History  . Marital status: Married    Spouse name: Not on file  . Number of children: 2  . Years of education: Not on file  . Highest education level: Associate degree: academic program  Occupational History  . Occupation: retired  Scientific laboratory technician  . Financial resource strain: Not hard at all  . Food insecurity:    Worry: Never true    Inability: Never true  . Transportation needs:    Medical: No    Non-medical: No  Tobacco Use  . Smoking status: Former Smoker    Types: Cigarettes    Last attempt to quit: 10/19/1977    Years since quitting: 41.1  . Smokeless tobacco: Current User    Types: Chew  Substance and Sexual Activity  . Alcohol use: Yes    Alcohol/week: 0.0 standard drinks    Comment: very seldom- wine  . Drug use: No  . Sexual activity: Not on file  Lifestyle  . Physical activity:    Days per week: Not on file    Minutes per session: Not on file  . Stress: Only a little  Relationships  . Social connections:    Talks on phone: Not on file    Gets together: Not on file    Attends religious service: Not on file    Active member  of club or organization: Not on file    Attends meetings of clubs or organizations: Not on file    Relationship status: Not on file  . Intimate partner violence:    Fear of current or ex partner: Not on file    Emotionally abused: Not on file    Physically abused: Not on file    Forced sexual activity: Not on file  Other Topics Concern  . Not on file  Social History Narrative  . Not on file    Outpatient Encounter Medications as of 11/22/2018  Medication Sig Note  . Ascorbic Acid (VITAMIN C) 1000 MG tablet Take 1,000 mg by mouth daily.   . B Complex Vitamins (VITAMIN B COMPLEX PO) Take 1 tablet by mouth daily.   . beta carotene 25000 UNIT capsule Take 25,000 Units by mouth daily.   . celecoxib (CELEBREX) 200 MG capsule TAKE 1 CAPSULE BY MOUTH EVERY DAY  (Patient taking differently: daily as needed. )   . fish oil-omega-3 fatty acids 1000 MG capsule Take 1 g by mouth daily.   . folic acid (FOLVITE) 867 MCG tablet Take 1 tablet (400 mcg total) by mouth daily.   Marland Kitchen GLUCOSAMINE-CHONDROIT-VIT C-MN PO Take by mouth daily.  03/18/2015: OTC Received from: [facility]  . losartan (COZAAR) 25 MG tablet Take 1 tablet (25 mg total) by mouth daily.   . metoprolol succinate (TOPROL-XL) 50 MG 24 hr tablet Take 1/2 tablet (25 mg) by mouth once a day. Take with or immediately following a meal.   . Multiple Vitamin (MULTIVITAMIN WITH MINERALS) TABS tablet Take 1 tablet by mouth daily.   Marland Kitchen oxyCODONE (ROXICODONE) 15 MG immediate release tablet Take 1 tablet (15 mg total) by mouth every 6 (six) hours as needed for pain.   . rosuvastatin (CRESTOR) 5 MG tablet TAKE 1 TABLET(5 MG) BY MOUTH DAILY   . Selenium 200 MCG TABS Take 200 mcg by mouth daily.   . tadalafil (CIALIS) 5 MG tablet TAKE 1 TABLET BY MOUTH DAILY FOR BPH   . aspirin EC 81 MG tablet Take 1 tablet (81 mg total) by mouth daily. (Patient not taking: Reported on 11/22/2018)    No facility-administered encounter medications on file as of 11/22/2018.     No Known Allergies  Review of Systems  Constitutional: Positive for malaise/fatigue. Negative for fever.  HENT: Negative.   Eyes: Negative.   Respiratory: Negative for cough, shortness of breath and wheezing.   Cardiovascular: Positive for leg swelling. Negative for chest pain, palpitations, orthopnea and claudication.  Gastrointestinal: Negative.   Genitourinary: Negative.   Musculoskeletal: Positive for back pain and joint pain.  Skin: Negative.   Endo/Heme/Allergies: Negative.   Psychiatric/Behavioral: Negative.     Objective:  BP 132/60 (BP Location: Right Arm, Patient Position: Sitting, Cuff Size: Normal)   Pulse 64   Temp 98.2 F (36.8 C) (Oral)   Resp 14   Wt 160 lb (72.6 kg)   BMI 21.70 kg/m   Physical Exam    Constitutional: He is oriented to person, place, and time and well-developed, well-nourished, and in no distress.  HENT:  Head: Normocephalic and atraumatic.  Right Ear: External ear normal.  Left Ear: External ear normal.  Nose: Nose normal.  Mouth/Throat: Oropharynx is clear and moist.  Eyes: Conjunctivae are normal. No scleral icterus.  Neck: No thyromegaly present.  Cardiovascular: Normal rate, regular rhythm and normal heart sounds.  Pulmonary/Chest: Effort normal and breath sounds normal.  Abdominal: Soft.  Genitourinary:  Prostate, penis and rectum normal.  Rectum:     Guaiac result negative.   Neurological: He is alert and oriented to person, place, and time. No cranial nerve deficit. Gait normal. GCS score is 15.  Skin: Skin is warm and dry.  Psychiatric: Mood, memory, affect and judgment normal.    Assessment and Plan :   1. Anemia, unspecified type Oncology recommended CT abdomen chest and pelvis and.  OC light today is negative. - CT Abdomen Pelvis W Contrast; Future - CT Chest W Contrast; Future  2. Other chronic pain Meds refilled for 3 months.  Advised patient it would be in his best interest to cut back on narcotics. - oxyCODONE (ROXICODONE) 15 MG immediate release tablet; Take 1 tablet (15 mg total) by mouth every 6 (six) hours as needed for pain.  Dispense: 120 tablet; Refill: 0 3.Macrocytic anemia Oncology is recommended the work-up as above.  I will order this.  It might have to come through oncology as an order.  HPI, Exam and A&P Transcribed under the direction and in the presence of Miguel Aschoff, Brooke Bonito., MD. Electronically Signed: Althea Charon, RMA I have done the exam and reviewed the chart and it is accurate to the best of my knowledge. Development worker, community has been used and  any errors in dictation or transcription are unintentional. Miguel Aschoff M.D. [facility]

## 2018-11-25 ENCOUNTER — Ambulatory Visit: Payer: Self-pay | Admitting: Family Medicine

## 2018-11-26 ENCOUNTER — Telehealth: Payer: Self-pay | Admitting: Family Medicine

## 2018-11-26 NOTE — Telephone Encounter (Signed)
Pt called saying he was in Monday and was told we were going to schedule him a ct of abd and pelvis and he has not heard anything back  Please advise  Pt is aware Dr. [name] is out today and will be back monday  [phone]  Thanks  teri

## 2018-11-26 NOTE — Telephone Encounter (Signed)
This order might have to come through Oncology-- he probably needs to call their office--he needs to sign release so we can send local Hematology notes from [facility] hematology. They canniot see the [facility] notes due to HIPPA

## 2018-11-26 NOTE — Telephone Encounter (Signed)
Dr Gala Lewandowsky was the one that we had trouble ordering because the diagnosis wouldn't work.  Can you please look in his note where the order is still pended and see what you can do. Thanks

## 2018-11-29 ENCOUNTER — Telehealth: Payer: Self-pay | Admitting: Family Medicine

## 2018-11-29 NOTE — Telephone Encounter (Signed)
Pt is still waiting for the appointment to be scheduled for his  CT Scan of:  Chest  Abdomin  Pelvic   Pt wanting to get this done asap.  Please advise.  Thanks, [employer]

## 2018-11-29 NOTE — Telephone Encounter (Signed)
LMTCB ED 

## 2018-11-29 NOTE — Telephone Encounter (Signed)
Patient advised and he states he will call the oncology office and see if they can get this ordered.

## 2018-11-29 NOTE — Telephone Encounter (Signed)
See other phone message  

## 2018-12-01 ENCOUNTER — Telehealth: Payer: Self-pay

## 2018-12-01 NOTE — Telephone Encounter (Signed)
Patient left voicemail saying that he saw Dr. Annabelle Harman at [facility] for second opinion. Dr. Annabelle Harman recommended a CT chest/abd/pelvis to rule out any tumors. Patient notified Dr. [name] (PCP) of request, however, ins auth would not go through because it needs to be ordered by an oncologist. Patient asking if Dr. Tasia Catchings will order CT.   Per Dr. Tasia Catchings, ok to order if patient resumes care with her.   Contacted patient and he states that he has gone back to see [facility] doctor and "everthing has been lined up."  Nothing further needed from [place].

## 2018-12-09 DIAGNOSIS — D649 Anemia, unspecified: Secondary | ICD-10-CM | POA: Diagnosis not present

## 2018-12-09 DIAGNOSIS — R0600 Dyspnea, unspecified: Secondary | ICD-10-CM | POA: Diagnosis not present

## 2018-12-09 DIAGNOSIS — R634 Abnormal weight loss: Secondary | ICD-10-CM | POA: Diagnosis not present

## 2018-12-20 DIAGNOSIS — I872 Venous insufficiency (chronic) (peripheral): Secondary | ICD-10-CM | POA: Diagnosis not present

## 2018-12-20 DIAGNOSIS — D485 Neoplasm of uncertain behavior of skin: Secondary | ICD-10-CM | POA: Diagnosis not present

## 2018-12-20 DIAGNOSIS — L570 Actinic keratosis: Secondary | ICD-10-CM | POA: Diagnosis not present

## 2018-12-20 DIAGNOSIS — X32XXXA Exposure to sunlight, initial encounter: Secondary | ICD-10-CM | POA: Diagnosis not present

## 2018-12-20 DIAGNOSIS — Z85828 Personal history of other malignant neoplasm of skin: Secondary | ICD-10-CM | POA: Diagnosis not present

## 2018-12-20 DIAGNOSIS — Z08 Encounter for follow-up examination after completed treatment for malignant neoplasm: Secondary | ICD-10-CM | POA: Diagnosis not present

## 2018-12-20 DIAGNOSIS — D2372 Other benign neoplasm of skin of left lower limb, including hip: Secondary | ICD-10-CM | POA: Diagnosis not present

## 2018-12-22 DIAGNOSIS — D539 Nutritional anemia, unspecified: Secondary | ICD-10-CM | POA: Diagnosis not present

## 2018-12-22 DIAGNOSIS — Z862 Personal history of diseases of the blood and blood-forming organs and certain disorders involving the immune mechanism: Secondary | ICD-10-CM | POA: Diagnosis not present

## 2018-12-22 DIAGNOSIS — D649 Anemia, unspecified: Secondary | ICD-10-CM | POA: Diagnosis not present

## 2019-01-25 ENCOUNTER — Telehealth: Payer: Self-pay

## 2019-01-25 DIAGNOSIS — Z720 Tobacco use: Secondary | ICD-10-CM

## 2019-01-25 NOTE — Telephone Encounter (Signed)
Pt would like a prescription for Nicorette Gum sent to [facility].   Contact Number: [phone]   Thanks,   -Mickel Baas

## 2019-01-26 ENCOUNTER — Ambulatory Visit: Payer: Self-pay | Admitting: Family Medicine

## 2019-01-27 MED ORDER — NICOTINE POLACRILEX 4 MG MT GUM: 4.0000 mg | CHEWING_GUM | OROMUCOSAL | 0 refills | Status: DC | PRN

## 2019-01-27 NOTE — Addendum Note (Signed)
Addended by: Wilder Glade on: 01/27/2019 04:58 PM   Modules accepted: Orders

## 2019-01-27 NOTE — Telephone Encounter (Signed)
Medication sent in. Ok per Dr. Darnell Level.

## 2019-02-21 ENCOUNTER — Other Ambulatory Visit: Payer: Self-pay

## 2019-02-21 ENCOUNTER — Encounter: Payer: Self-pay | Admitting: Family Medicine

## 2019-02-21 ENCOUNTER — Ambulatory Visit (INDEPENDENT_AMBULATORY_CARE_PROVIDER_SITE_OTHER): Payer: Medicare Other | Admitting: Family Medicine

## 2019-02-21 VITALS — BP 122/70 | HR 58 | Temp 98.8°F | Resp 16 | Ht 72.0 in | Wt 161.0 lb

## 2019-02-21 DIAGNOSIS — D649 Anemia, unspecified: Secondary | ICD-10-CM | POA: Diagnosis not present

## 2019-02-21 DIAGNOSIS — I872 Venous insufficiency (chronic) (peripheral): Secondary | ICD-10-CM

## 2019-02-21 DIAGNOSIS — J309 Allergic rhinitis, unspecified: Secondary | ICD-10-CM | POA: Diagnosis not present

## 2019-02-21 DIAGNOSIS — G8929 Other chronic pain: Secondary | ICD-10-CM | POA: Diagnosis not present

## 2019-02-21 DIAGNOSIS — I10 Essential (primary) hypertension: Secondary | ICD-10-CM | POA: Diagnosis not present

## 2019-02-21 DIAGNOSIS — E7800 Pure hypercholesterolemia, unspecified: Secondary | ICD-10-CM

## 2019-02-21 DIAGNOSIS — M5441 Lumbago with sciatica, right side: Secondary | ICD-10-CM | POA: Diagnosis not present

## 2019-02-21 DIAGNOSIS — M5442 Lumbago with sciatica, left side: Secondary | ICD-10-CM

## 2019-02-21 MED ORDER — OXYCODONE HCL 15 MG PO TABS: 15.0000 mg | ORAL_TABLET | Freq: Four times a day (QID) | ORAL | 0 refills | Status: DC | PRN

## 2019-02-21 MED ORDER — FLUTICASONE PROPIONATE 50 MCG/ACT NA SUSP: 2.0000 | Freq: Every day | NASAL | 6 refills | Status: DC

## 2019-02-21 NOTE — Patient Instructions (Signed)
Wear Knee high support hose to decrease swelling.

## 2019-02-21 NOTE — Progress Notes (Signed)
Patient: Daniel Owens Male    DOB: [birth year]   73 y.o.   MRN: [id number] Visit Date: 02/21/2019  Today's Provider: [name], MD   Chief Complaint  Patient presents with  . Follow-up   Subjective:     HPI  Patient comes in today for a 3 month follow up. He reports that he is due for a refill on his pain medication. He reports good compliance and good symptom control.  Patient also mentions that his urine has a bad odor. He reports that this has been ongoing for about 1 week. He denies hematuria, lower abdominal pain, nausea, or frequency.   He has a list of chronic issues he wishes to review. He has chronic venous stasis changes with associated skin color changes.  No Known Allergies   Current Outpatient Medications:  .  Ascorbic Acid (VITAMIN C) 1000 MG tablet, Take 1,000 mg by mouth daily., Disp: , Rfl:  .  B Complex Vitamins (VITAMIN B COMPLEX PO), Take 1 tablet by mouth daily., Disp: , Rfl:  .  beta carotene 25000 UNIT capsule, Take 25,000 Units by mouth daily., Disp: , Rfl:  .  fish oil-omega-3 fatty acids 1000 MG capsule, Take 1 g by mouth daily., Disp: , Rfl:  .  folic acid (FOLVITE) 784 MCG tablet, Take 1 tablet (400 mcg total) by mouth daily., Disp: 30 tablet, Rfl: 0 .  GLUCOSAMINE-CHONDROIT-VIT C-MN PO, Take by mouth daily. , Disp: , Rfl:  .  losartan (COZAAR) 25 MG tablet, Take 1 tablet (25 mg total) by mouth daily., Disp: 90 tablet, Rfl: 3 .  metoprolol succinate (TOPROL-XL) 50 MG 24 hr tablet, Take 1/2 tablet (25 mg) by mouth once a day. Take with or immediately following a meal., Disp: 90 tablet, Rfl: 3 .  Multiple Vitamin (MULTIVITAMIN WITH MINERALS) TABS tablet, Take 1 tablet by mouth daily., Disp: , Rfl:  .  nicotine polacrilex (NICORETTE) 4 MG gum, Take 1 each (4 mg total) by mouth as needed for smoking cessation., Disp: 100 tablet, Rfl: 0 .  rosuvastatin (CRESTOR) 5 MG tablet, TAKE 1 TABLET(5 MG) BY MOUTH DAILY, Disp: 90 tablet, Rfl: 3 .   Selenium 200 MCG TABS, Take 200 mcg by mouth daily., Disp: , Rfl:  .  tadalafil (CIALIS) 5 MG tablet, TAKE 1 TABLET BY MOUTH DAILY FOR BPH, Disp: 90 tablet, Rfl: 3 .  aspirin EC 81 MG tablet, Take 1 tablet (81 mg total) by mouth daily. (Patient not taking: Reported on 11/22/2018), Disp: 90 tablet, Rfl: 3 .  celecoxib (CELEBREX) 200 MG capsule, TAKE 1 CAPSULE BY MOUTH EVERY DAY (Patient not taking: No sig reported), Disp: 90 capsule, Rfl: 3 .  oxyCODONE (ROXICODONE) 15 MG immediate release tablet, Take 1 tablet (15 mg total) by mouth every 6 (six) hours as needed for pain., Disp: 120 tablet, Rfl: 0  Review of Systems  Constitutional: Negative for activity change, appetite change, diaphoresis and fatigue.  Eyes: Negative.   Respiratory: Negative for cough and shortness of breath.   Cardiovascular: Negative for chest pain, palpitations and leg swelling.  Gastrointestinal: Negative.   Endocrine: Negative.   Genitourinary: Negative for decreased urine volume, discharge, dysuria, hematuria, scrotal swelling, testicular pain and urgency.  Musculoskeletal: Negative for arthralgias.  Allergic/Immunologic: Negative.   Neurological: Negative for dizziness and headaches.  Psychiatric/Behavioral: Negative.     Social History   Tobacco Use  . Smoking status: Former Smoker    Types: Cigarettes  Last attempt to quit: 10/19/1977    Years since quitting: 41.3  . Smokeless tobacco: Current User    Types: Chew  Substance Use Topics  . Alcohol use: Yes    Alcohol/week: 0.0 standard drinks    Comment: very seldom- wine      Objective:   BP 122/70 (BP Location: Left Arm, Patient Position: Sitting, Cuff Size: Normal)   Pulse (!) 58   Temp 98.8 F (37.1 C)   Resp 16   Ht 6' (1.829 m)   Wt 161 lb (73 kg)   SpO2 99%   BMI 21.84 kg/m  Vitals:   02/21/19 1041  BP: 122/70  Pulse: (!) 58  Resp: 16  Temp: 98.8 F (37.1 C)  SpO2: 99%  Weight: 161 lb (73 kg)  Height: 6' (1.829 m)      Physical Exam Vitals signs reviewed.  Constitutional:      Appearance: He is well-developed.  HENT:     Head: Normocephalic and atraumatic.     Right Ear: External ear normal.     Left Ear: External ear normal.     Nose: Nose normal.  Eyes:     General: No scleral icterus.    Conjunctiva/sclera: Conjunctivae normal.  Neck:     Thyroid: No thyromegaly.  Cardiovascular:     Rate and Rhythm: Normal rate and regular rhythm.     Heart sounds: Normal heart sounds.  Pulmonary:     Effort: Pulmonary effort is normal.     Breath sounds: Normal breath sounds.  Abdominal:     Palpations: Abdomen is soft.  Skin:    General: Skin is warm and dry.  Neurological:     Mental Status: He is alert and oriented to person, place, and time.  Psychiatric:        Behavior: Behavior normal.        Thought Content: Thought content normal.        Judgment: Judgment normal.         Assessment & Plan    1. Anemia, unspecified type Per hematology - CBC with Differential/Platelet  2. Allergic rhinitis, unspecified seasonality, unspecified trigger For congestion and occasional rhinorrhea. - fluticasone (FLONASE) 50 MCG/ACT nasal spray; Place 2 sprays into both nostrils daily.  Dispense: 16 g; Refill: 6  3. Essential hypertension  - Comprehensive metabolic panel  4. Pure hypercholesterolemia  - TSH - Lipid panel  5. Chronic bilateral low back pain with bilateral sciatica   6. Other chronic pain Discussed maybe in future cutting back on dose.Pt has been on this for many years. - oxyCODONE (ROXICODONE) 15 MG immediate release tablet; Take 1 tablet (15 mg total) by mouth every 6 (six) hours as needed for pain.  Dispense: 120 tablet; Refill: 0 7.Chronic venous insufficiency Recommend knee high support hose. More than 50% of 25 minute visit spent on counseling coordination of care.    Jed Kutch Cranford Mon, MD  [facility]

## 2019-02-22 DIAGNOSIS — E7800 Pure hypercholesterolemia, unspecified: Secondary | ICD-10-CM | POA: Diagnosis not present

## 2019-02-22 DIAGNOSIS — I10 Essential (primary) hypertension: Secondary | ICD-10-CM | POA: Diagnosis not present

## 2019-02-22 DIAGNOSIS — D649 Anemia, unspecified: Secondary | ICD-10-CM | POA: Diagnosis not present

## 2019-02-23 LAB — CBC WITH DIFFERENTIAL/PLATELET
Basophils Absolute: 0 10*3/uL (ref 0.0–0.2)
Basos: 1 %
EOS (ABSOLUTE): 0.1 10*3/uL (ref 0.0–0.4)
Eos: 2 %
Hematocrit: 34.2 % — ABNORMAL LOW (ref 37.5–51.0)
Hemoglobin: 11.6 g/dL — ABNORMAL LOW (ref 13.0–17.7)
Immature Grans (Abs): 0 10*3/uL (ref 0.0–0.1)
Immature Granulocytes: 0 %
Lymphocytes Absolute: 1.1 10*3/uL (ref 0.7–3.1)
Lymphs: 25 %
MCH: 35.7 pg — ABNORMAL HIGH (ref 26.6–33.0)
MCHC: 33.9 g/dL (ref 31.5–35.7)
MCV: 105 fL — ABNORMAL HIGH (ref 79–97)
Monocytes Absolute: 0.5 10*3/uL (ref 0.1–0.9)
Monocytes: 12 %
Neutrophils Absolute: 2.6 10*3/uL (ref 1.4–7.0)
Neutrophils: 60 %
Platelets: 150 10*3/uL (ref 150–450)
RBC: 3.25 x10E6/uL — ABNORMAL LOW (ref 4.14–5.80)
RDW: 11.9 % (ref 11.6–15.4)
WBC: 4.4 10*3/uL (ref 3.4–10.8)

## 2019-02-23 LAB — COMPREHENSIVE METABOLIC PANEL
ALT: 14 IU/L (ref 0–44)
AST: 21 IU/L (ref 0–40)
Albumin/Globulin Ratio: 2 (ref 1.2–2.2)
Albumin: 4.3 g/dL (ref 3.7–4.7)
Alkaline Phosphatase: 54 IU/L (ref 39–117)
BUN/Creatinine Ratio: 23 (ref 10–24)
BUN: 23 mg/dL (ref 8–27)
Bilirubin Total: 0.3 mg/dL (ref 0.0–1.2)
CO2: 22 mmol/L (ref 20–29)
Calcium: 9.3 mg/dL (ref 8.6–10.2)
Chloride: 102 mmol/L (ref 96–106)
Creatinine, Ser: 0.99 mg/dL (ref 0.76–1.27)
GFR calc Af Amer: 88 mL/min/{1.73_m2} (ref >59)
GFR calc non Af Amer: 76 mL/min/{1.73_m2} (ref >59)
Globulin, Total: 2.2 g/dL (ref 1.5–4.5)
Glucose: 90 mg/dL (ref 65–99)
Potassium: 4.1 mmol/L (ref 3.5–5.2)
Sodium: 139 mmol/L (ref 134–144)
Total Protein: 6.5 g/dL (ref 6.0–8.5)

## 2019-02-23 LAB — LIPID PANEL
Chol/HDL Ratio: 1.8 ratio (ref 0.0–5.0)
Cholesterol, Total: 133 mg/dL (ref 100–199)
HDL: 74 mg/dL (ref >39)
LDL Calculated: 39 mg/dL (ref 0–99)
Triglycerides: 101 mg/dL (ref 0–149)
VLDL Cholesterol Cal: 20 mg/dL (ref 5–40)

## 2019-02-23 LAB — TSH: TSH: 1.59 u[IU]/mL (ref 0.450–4.500)

## 2019-02-28 ENCOUNTER — Ambulatory Visit: Payer: Self-pay | Admitting: Family Medicine

## 2019-03-22 DIAGNOSIS — D649 Anemia, unspecified: Secondary | ICD-10-CM | POA: Diagnosis not present

## 2019-03-26 ENCOUNTER — Other Ambulatory Visit: Payer: Self-pay | Admitting: Family Medicine

## 2019-04-07 ENCOUNTER — Other Ambulatory Visit: Payer: Self-pay | Admitting: Family Medicine

## 2019-04-07 DIAGNOSIS — I700 Atherosclerosis of aorta: Secondary | ICD-10-CM

## 2019-04-14 DIAGNOSIS — M5416 Radiculopathy, lumbar region: Secondary | ICD-10-CM | POA: Diagnosis not present

## 2019-04-14 DIAGNOSIS — M5116 Intervertebral disc disorders with radiculopathy, lumbar region: Secondary | ICD-10-CM | POA: Diagnosis not present

## 2019-05-03 DIAGNOSIS — H2513 Age-related nuclear cataract, bilateral: Secondary | ICD-10-CM | POA: Diagnosis not present

## 2019-05-24 ENCOUNTER — Other Ambulatory Visit: Payer: Self-pay

## 2019-05-24 ENCOUNTER — Other Ambulatory Visit: Payer: Self-pay | Admitting: Family Medicine

## 2019-05-24 DIAGNOSIS — G8929 Other chronic pain: Secondary | ICD-10-CM

## 2019-05-24 MED ORDER — OXYCODONE HCL 15 MG PO TABS: 15.0000 mg | ORAL_TABLET | Freq: Four times a day (QID) | ORAL | 0 refills | Status: DC | PRN

## 2019-05-24 NOTE — Telephone Encounter (Signed)
Pt needing a refill on:  oxyCODONE (ROXICODONE) 15 MG immediate release tablet - only needing a 3 week supply refill.  Pt stated Dr. [name] will know.   Please fill at:  [facility] [place] [phone] (Phone) [phone] (Fax)

## 2019-05-24 NOTE — Telephone Encounter (Signed)
Please review. Thanks!  

## 2019-05-30 ENCOUNTER — Telehealth: Payer: Self-pay

## 2019-05-30 DIAGNOSIS — G8929 Other chronic pain: Secondary | ICD-10-CM

## 2019-05-30 MED ORDER — OXYCODONE HCL 15 MG PO TABS: 15.0000 mg | ORAL_TABLET | Freq: Four times a day (QID) | ORAL | 0 refills | Status: DC | PRN

## 2019-05-30 NOTE — Telephone Encounter (Signed)
Please review for Dr. Gilbert  Thanks,   -Raeonna Milo  

## 2019-05-30 NOTE — Telephone Encounter (Signed)
Patient requesting refill, patient did call 5-6 days ago.

## 2019-06-06 DIAGNOSIS — Z862 Personal history of diseases of the blood and blood-forming organs and certain disorders involving the immune mechanism: Secondary | ICD-10-CM | POA: Diagnosis not present

## 2019-06-06 DIAGNOSIS — D649 Anemia, unspecified: Secondary | ICD-10-CM | POA: Diagnosis not present

## 2019-06-13 ENCOUNTER — Emergency Department
Admission: EM | Admit: 2019-06-13 | Discharge: 2019-06-13 | Disposition: A | Discharge status: Home / Self Care | Payer: Medicare Other | Attending: Emergency Medicine | Admitting: Emergency Medicine

## 2019-06-13 ENCOUNTER — Encounter: Payer: Self-pay | Admitting: Emergency Medicine

## 2019-06-13 ENCOUNTER — Emergency Department: Payer: Medicare Other

## 2019-06-13 ENCOUNTER — Other Ambulatory Visit: Payer: Self-pay

## 2019-06-13 DIAGNOSIS — Z79899 Other long term (current) drug therapy: Secondary | ICD-10-CM | POA: Diagnosis not present

## 2019-06-13 DIAGNOSIS — I10 Essential (primary) hypertension: Secondary | ICD-10-CM | POA: Diagnosis not present

## 2019-06-13 DIAGNOSIS — F17220 Nicotine dependence, chewing tobacco, uncomplicated: Secondary | ICD-10-CM | POA: Insufficient documentation

## 2019-06-13 DIAGNOSIS — S4992XA Unspecified injury of left shoulder and upper arm, initial encounter: Secondary | ICD-10-CM | POA: Diagnosis not present

## 2019-06-13 DIAGNOSIS — Z7982 Long term (current) use of aspirin: Secondary | ICD-10-CM | POA: Diagnosis not present

## 2019-06-13 DIAGNOSIS — M25512 Pain in left shoulder: Secondary | ICD-10-CM | POA: Diagnosis not present

## 2019-06-13 HISTORY — DX: Nutritional anemia, unspecified: D53.9

## 2019-06-13 MED ORDER — ONDANSETRON 4 MG PO TBDP
4.0000 mg | ORAL_TABLET | Freq: Once | ORAL | Status: AC
  Administered 2019-06-13: 4 mg via ORAL
  Filled 2019-06-13: qty 1

## 2019-06-13 MED ORDER — OXYCODONE-ACETAMINOPHEN 5-325 MG PO TABS
1.0000 | ORAL_TABLET | Freq: Once | ORAL | Status: AC
  Administered 2019-06-13: 1 via ORAL
  Filled 2019-06-13: qty 1

## 2019-06-13 NOTE — ED Notes (Signed)
ED Provider at bedside. 

## 2019-06-13 NOTE — ED Notes (Signed)
Patient has skin tear to left back of forearm (approx 3 inches in length), and small skin tear to left elbow (approx 1 inch) with bleeding controlled. Gauze bandage and wrap applied.

## 2019-06-13 NOTE — ED Notes (Signed)
Sling applied .

## 2019-06-13 NOTE — ED Triage Notes (Signed)
Patient to ER for c/o left sided shoulder pain after fall today in the woods. Patient states he "got up in some grasses when he was out there to spray some Round Up". Patient ambulatory to triage without difficult. Denies any dizziness at any point.

## 2019-06-13 NOTE — ED Provider Notes (Signed)
[facility] Emergency Department Provider Note  ____________________________________________  Time seen: Approximately 9:54 PM  I have reviewed the triage vital signs and the nursing notes.   HISTORY  Chief Complaint Shoulder Injury    HPI Daniel Owens is a 73 y.o. male presents to the emergency department with acute left shoulder pain after a fall while patient was spraying Roundup.  Patient states that his foot dropped into a hole and he lost his balance.  Patient states that he fell onto left shoulder but did not hit his head or his neck.  Patient has not been able to abduct left upper extremity at left shoulder since injury occurred.  No numbness or tingling in the left hand. No other alleviating measures have been attempted.  Patient states that he sustained abrasions to left elbow and left forearm but is only here to have left shoulder examined.        Past Medical History:  Diagnosis Date  . Abnormal stress test    a. 07/2017 ETT: Ex 10:15, no chest pain, HTN response, 56mm ST dep in II, II, aVF, V4-V6 - returned to baseline in 5 mins-->Intermediate risk study.  . Aortic atherosclerosis ([place])    a. Incidentally noted on Chest CT in 2016.  . Carotid arterial disease ([place])    a. 07/2014 carotid u/s: mild <50%, bilat ICA stenosis.  . Hypertension   . Macrocytic anemia   . osteoarthritis   . Palpitations    a. ? prior dx of PAF.  Marland Kitchen Raynauds syndrome   . Systolic murmur    a. Reports h/o rheumatic fever as a child;  b. 07/2017 Echo: EF 55-60%, no rwma, triv AI, nl RV fxn.    Patient Active Problem List   Diagnosis Date Noted  . DOE (dyspnea on exertion) 07/17/2017  . Heart murmur 07/17/2017  . Abnormal EKG 07/17/2017  . Carotid artery disease ([place]) 07/17/2017  . Aortic atherosclerosis ([place]) 05/20/2017  . Hyperlipemia 05/20/2017  . BPH associated with nocturia 08/05/2016  . Esophageal reflux 03/18/2015  . Arthritis 03/18/2015  . Back pain,  chronic 03/18/2015  . Cardiac conduction disorder 03/18/2015  . Narrowing of intervertebral disc space 03/18/2015  . Essential hypertension 03/18/2015  . History of anemia 03/18/2015  . History of atrial fibrillation 03/18/2015  . Weak pulse 03/18/2015  . Palpitations 03/18/2015  . Peyronie's disease 03/18/2015  . Raynaud's syndrome 03/18/2015  . Complete rotator cuff rupture of left shoulder 05/12/2013    Past Surgical History:  Procedure Laterality Date  . APPENDECTOMY    . BILATERAL KNEE ARTHROSCOPY    . HERNIA REPAIR    . NECK SURGERY     involving disc  . NOSE SURGERY    . RETINAL DETACHMENT SURGERY    . SHOULDER SURGERY     Right-x 3 overall  . TENDON REPAIR     involving Tricep tendon    Prior to Admission medications   Medication Sig Start Date End Date Taking? Authorizing Provider  Ascorbic Acid (VITAMIN C) 1000 MG tablet Take 1,000 mg by mouth daily.    [provider]  aspirin EC 81 MG tablet Take 1 tablet (81 mg total) by mouth daily. Patient not taking: Reported on 11/22/2018 07/17/17   End, Harrell Gave, MD  B Complex Vitamins (VITAMIN B COMPLEX PO) Take 1 tablet by mouth daily.    [provider]  beta carotene 25000 UNIT capsule Take 25,000 Units by mouth daily.    [provider]  celecoxib (CELEBREX) 200 MG capsule TAKE 1 CAPSULE BY MOUTH EVERY DAY Patient not taking: No sig reported 07/06/17   Jerrol Banana., MD  fish oil-omega-3 fatty acids 1000 MG capsule Take 1 g by mouth daily.    [provider]  fluticasone (FLONASE) 50 MCG/ACT nasal spray Place 2 sprays into both nostrils daily. 02/21/19   Jerrol Banana., MD  folic acid (FOLVITE) A999333 MCG tablet Take 1 tablet (400 mcg total) by mouth daily. 07/30/17   Jerrol Banana., MD  GLUCOSAMINE-CHONDROIT-VIT C-MN PO Take by mouth daily.  01/16/15   [provider]  losartan (COZAAR) 25 MG tablet TAKE 1 TABLET BY MOUTH DAILY 03/26/19   Jerrol Banana., MD  metoprolol succinate (TOPROL-XL) 50 MG 24 hr tablet Take 1/2 tablet (25 mg) by mouth once a day. Take with or immediately following a meal. 09/03/17   End, Harrell Gave, MD  Multiple Vitamin (MULTIVITAMIN WITH MINERALS) TABS tablet Take 1 tablet by mouth daily.    [provider]  nicotine polacrilex (NICORETTE) 4 MG gum Take 1 each (4 mg total) by mouth as needed for smoking cessation. 01/27/19   Jerrol Banana., MD  oxyCODONE (ROXICODONE) 15 MG immediate release tablet Take 1 tablet (15 mg total) by mouth every 6 (six) hours as needed for pain. 05/30/19   Birdie Sons, MD  rosuvastatin (CRESTOR) 5 MG tablet TAKE 1 TABLET(5 MG) BY MOUTH DAILY 04/07/19   Jerrol Banana., MD  Selenium 200 MCG TABS Take 200 mcg by mouth daily.    [provider]  tadalafil (CIALIS) 5 MG tablet TAKE 1 TABLET BY MOUTH DAILY FOR BPH 10/25/18   Jerrol Banana., MD    Allergies Patient has no known allergies.  Family History  Problem Relation Age of Onset  . Stroke Mother   . Uterine cancer Mother   . Throat cancer Father   . Allergic rhinitis Brother   . Prostate cancer Maternal Uncle   . Prostate cancer Paternal Uncle   . Heart attack Maternal Grandmother   . Heart attack Maternal Grandfather     Social History Social History   Tobacco Use  . Smoking status: Former Smoker    Types: Cigarettes    Quit date: 10/19/1977    Years since quitting: 41.6  . Smokeless tobacco: Current User    Types: Chew  Substance Use Topics  . Alcohol use: Yes    Alcohol/week: 0.0 standard drinks    Comment: very seldom- wine  . Drug use: No     Review of Systems  Constitutional: No fever/chills Eyes: No visual changes. No discharge ENT: No upper respiratory complaints. Cardiovascular: no chest pain. Respiratory: no cough. No SOB. Gastrointestinal: No abdominal pain.  No nausea, no vomiting.  No diarrhea.  No constipation. Genitourinary: Negative for dysuria. No  hematuria Musculoskeletal: Patient has left shoulder pain.  Skin: Negative for rash, abrasions, lacerations, ecchymosis. Neurological: Negative for headaches, focal weakness or numbness.   ____________________________________________   PHYSICAL EXAM:  VITAL SIGNS: ED Triage Vitals  Enc Vitals Group     BP 06/13/19 2029 (!) 167/48     Pulse Rate 06/13/19 2029 63     Resp 06/13/19 2029 20     Temp 06/13/19 2029 98.8 F (37.1 C)     Temp Source 06/13/19 2029 Oral     SpO2 06/13/19 2029 100 %     Weight 06/13/19 2030 160 lb (72.6 kg)  Height 06/13/19 2030 6\' 1"  (1.854 m)     Head Circumference --      Peak Flow --      Pain Score 06/13/19 2030 9     Pain Loc --      Pain Edu? --      Excl. in [place]? --      Constitutional: Alert and oriented. Well appearing and in no acute distress. Eyes: Conjunctivae are normal. PERRL. EOMI. Head: Atraumatic. Cardiovascular: Normal rate, regular rhythm. Normal S1 and S2.  Good peripheral circulation. Respiratory: Normal respiratory effort without tachypnea or retractions. Lungs CTAB. Good air entry to the bases with no decreased or absent breath sounds. Gastrointestinal: Bowel sounds 4 quadrants. Soft and nontender to palpation. No guarding or rigidity. No palpable masses. No distention. No CVA tenderness. Musculoskeletal: Patient has left rotator cuff weakness.  No pain to palpation over the proximal humerus.  Patient has no pain to palpation over the left AC joint.  Patient is able to spread all 5 left fingers and can perform flexion at the IP joint of the left thumb.  Palpable radial pulse, left.  Capillary refill less than 2 seconds on left hand. Neurologic:  Normal speech and language. No gross focal neurologic deficits are appreciated.  Skin:  Skin is warm, dry and intact. No rash noted. Psychiatric: Mood and affect are normal. Speech and behavior are normal. Patient exhibits appropriate insight and  judgement.   ____________________________________________   LABS (all labs ordered are listed, but only abnormal results are displayed)  Labs Reviewed - No data to display ____________________________________________  EKG   ____________________________________________  RADIOLOGY I personally viewed and evaluated these images as part of my medical decision making, as well as reviewing the written report by the radiologist.  Dg Shoulder Left  Result Date: 06/13/2019 CLINICAL DATA:  Fall, left shoulder pain EXAM: LEFT SHOULDER - 2+ VIEW COMPARISON:  None. FINDINGS: No evidence acute fracture or dislocation. Mild subcortical cystic change of the greater tuberosity likely reflects rotator cuff tendinopathy. Mild acromioclavicular osteoarthrosis is noted. Included portions of the left chest wall and left lung are unremarkable. Anterior cervical spinal fusion hardware is partially visualized. IMPRESSION: No acute fracture or traumatic malalignment. Moderate acromioclavicular osteoarthrosis. Electronically Signed   By: Lovena Le M.D.   On: 06/13/2019 21:02    ____________________________________________    PROCEDURES  Procedure(s) performed:    Procedures    Medications  oxyCODONE-acetaminophen (PERCOCET/ROXICET) 5-325 MG per tablet 1 tablet (1 tablet Oral Given 06/13/19 2145)  ondansetron (ZOFRAN-ODT) disintegrating tablet 4 mg (4 mg Oral Given 06/13/19 2145)     ____________________________________________   INITIAL IMPRESSION / ASSESSMENT AND PLAN / ED COURSE  Pertinent labs & imaging results that were available during my care of the patient were reviewed by me and considered in my medical decision making (see chart for details).  Review of the [place] CSRS was performed in accordance of the [place] prior to dispensing any controlled drugs.         Assessment and plan Shoulder pain 73 year old male presents to the emergency department after a mechanical, non-syncopal  fall that occurred earlier in the night.  Patient was hypertensive at triage but vital signs were otherwise reassuring. On physical exam, patient had left rotator cuff weakness.  Differential diagnosis includes fracture versus rotator cuff tear.  X-ray examination of the left shoulder revealed no bony abnormality.  Patient was placed in a sling in the emergency department and Percocet was given for pain.  Patient  states that he takes Norco chronically and no pain medication was prescribed at discharge as patient states that he has pain medication at home.  Referral information was provided for Dr. Sabra Heck.  All patient questions were answered.    ____________________________________________  FINAL CLINICAL IMPRESSION(S) / ED DIAGNOSES  Final diagnoses:  Acute pain of left shoulder      NEW MEDICATIONS STARTED DURING THIS VISIT:  ED Discharge Orders    None          This chart was dictated using voice recognition software/Dragon. Despite best efforts to proofread, errors can occur which can change the meaning. Any change was purely unintentional.    Lannie Fields, PA-C 06/13/19 2200    Duffy Bruce, MD 06/14/19 1030

## 2019-06-14 DIAGNOSIS — G8911 Acute pain due to trauma: Secondary | ICD-10-CM | POA: Diagnosis not present

## 2019-06-14 DIAGNOSIS — M25512 Pain in left shoulder: Secondary | ICD-10-CM | POA: Diagnosis not present

## 2019-06-14 DIAGNOSIS — M19012 Primary osteoarthritis, left shoulder: Secondary | ICD-10-CM | POA: Diagnosis not present

## 2019-06-16 NOTE — Progress Notes (Signed)
Subjective:   Daniel Owens is a 73 y.o. male who presents for Medicare Annual/Subsequent preventive examination.    This visit is being conducted through telemedicine due to the COVID-19 pandemic. This patient has given me verbal consent via doximity to conduct this visit, patient states they are participating from their home address. Some vital signs may be absent or patient reported.    Patient identification: identified by name, DOB, and current address  Review of Systems:  N/A  Cardiac Risk Factors include: advanced age (>53men, >58 women);dyslipidemia;male gender;hypertension     Objective:    Vitals: There were no vitals taken for this visit.  There is no height or weight on file to calculate BMI. Unable to obtain vitals due to visit being conducted via telephonically.   Advanced Directives 06/20/2019 06/13/2019 10/21/2018 09/29/2018 06/14/2018 06/12/2017 02/11/2017  Does Patient Have a Medical Advance Directive? Yes Yes Yes Yes Yes Yes Yes  Type of Advance Directive Living will;Healthcare Power of [place];Living will - - [place];Living will [place];Living will [place]  Does patient want to make changes to medical advance directive? - No - Patient declined - - - - -  Copy of [place] in Chart? No - copy requested No - copy requested - - No - copy requested No - copy requested -    Tobacco Social History   Tobacco Use  Smoking Status Former Smoker  . Types: Cigarettes  . Quit date: 10/19/1977  . Years since quitting: 41.6  Smokeless Tobacco Current User  . Types: Chew  Tobacco Comment   trying to quit     Ready to quit: Yes Counseling given: Not Answered Comment: trying to quit   Clinical Intake:  Pre-visit preparation completed: Yes  Pain : No/denies pain Pain Score: 0-No pain     Nutritional Risks: None  How often do you need to have someone help  you when you read instructions, pamphlets, or other written materials from your doctor or pharmacy?: 1 - Never  Interpreter Needed?: No  Information entered by :: [facility], LPN  Past Medical History:  Diagnosis Date  . Abnormal stress test    a. 07/2017 ETT: Ex 10:15, no chest pain, HTN response, 25mm ST dep in II, II, aVF, V4-V6 - returned to baseline in 5 mins-->Intermediate risk study.  . Aortic atherosclerosis ([place])    a. Incidentally noted on Chest CT in 2016.  . Carotid arterial disease ([place])    a. 07/2014 carotid u/s: mild <50%, bilat ICA stenosis.  . Hypertension   . Macrocytic anemia   . osteoarthritis   . Palpitations    a. ? prior dx of PAF.  Marland Kitchen Raynauds syndrome   . Systolic murmur    a. Reports h/o rheumatic fever as a child;  b. 07/2017 Echo: EF 55-60%, no rwma, triv AI, nl RV fxn.   Past Surgical History:  Procedure Laterality Date  . APPENDECTOMY    . BILATERAL KNEE ARTHROSCOPY    . HERNIA REPAIR    . NECK SURGERY     involving disc  . NOSE SURGERY    . RETINAL DETACHMENT SURGERY    . SHOULDER SURGERY     Right-x 3 overall  . TENDON REPAIR     involving Tricep tendon   Family History  Problem Relation Age of Onset  . Stroke Mother   . Uterine cancer Mother   . Throat cancer Father   .  Allergic rhinitis Brother   . Prostate cancer Maternal Uncle   . Prostate cancer Paternal Uncle   . Heart attack Maternal Grandmother   . Heart attack Maternal Grandfather    Social History   Socioeconomic History  . Marital status: Married    Spouse name: Not on file  . Number of children: 2  . Years of education: Not on file  . Highest education level: Associate degree: academic program  Occupational History  . Occupation: retired  Scientific laboratory technician  . Financial resource strain: Not hard at all  . Food insecurity    Worry: Never true    Inability: Never true  . Transportation needs    Medical: No    Non-medical: No  Tobacco Use  . Smoking status: Former  Smoker    Types: Cigarettes    Quit date: 10/19/1977    Years since quitting: 41.6  . Smokeless tobacco: Current User    Types: Chew  . Tobacco comment: trying to quit  Substance and Sexual Activity  . Alcohol use: Yes    Alcohol/week: 0.0 standard drinks    Comment: very seldom- wine  . Drug use: No  . Sexual activity: Not on file  Lifestyle  . Physical activity    Days per week: 0 days    Minutes per session: 0 min  . Stress: Not at all  Relationships  . Social Herbalist on phone: Patient refused    Gets together: Patient refused    Attends religious service: Patient refused    Active member of club or organization: Patient refused    Attends meetings of clubs or organizations: Patient refused    Relationship status: Patient refused  Other Topics Concern  . Not on file  Social History Narrative  . Not on file    Outpatient Encounter Medications as of 06/20/2019  Medication Sig  . Ascorbic Acid (VITAMIN C) 1000 MG tablet Take 1,000 mg by mouth daily.  . B Complex Vitamins (VITAMIN B COMPLEX PO) Take 1 tablet by mouth daily.  . beta carotene 25000 UNIT capsule Take 25,000 Units by mouth daily.  . celecoxib (CELEBREX) 200 MG capsule TAKE 1 CAPSULE BY MOUTH EVERY DAY  . fish oil-omega-3 fatty acids 1000 MG capsule Take 1 g by mouth daily.  . folic acid (FOLVITE) A999333 MCG tablet Take 1 tablet (400 mcg total) by mouth daily.  Marland Kitchen GLUCOSAMINE-CHONDROIT-VIT C-MN PO Take by mouth daily.   Marland Kitchen losartan (COZAAR) 25 MG tablet TAKE 1 TABLET BY MOUTH DAILY  . metoprolol succinate (TOPROL-XL) 50 MG 24 hr tablet Take 1/2 tablet (25 mg) by mouth once a day. Take with or immediately following a meal.  . Multiple Vitamin (MULTIVITAMIN WITH MINERALS) TABS tablet Take 1 tablet by mouth daily.  . nicotine polacrilex (NICORETTE) 4 MG gum Take 1 each (4 mg total) by mouth as needed for smoking cessation.  Marland Kitchen oxyCODONE (ROXICODONE) 15 MG immediate release tablet Take 1 tablet (15 mg total)  by mouth every 6 (six) hours as needed for pain.  . rosuvastatin (CRESTOR) 5 MG tablet TAKE 1 TABLET(5 MG) BY MOUTH DAILY  . Selenium 200 MCG TABS Take 200 mcg by mouth daily.  . tadalafil (CIALIS) 5 MG tablet TAKE 1 TABLET BY MOUTH DAILY FOR BPH  . aspirin EC 81 MG tablet Take 1 tablet (81 mg total) by mouth daily. (Patient not taking: Reported on 06/20/2019)  . fluticasone (FLONASE) 50 MCG/ACT nasal spray Place 2 sprays into both  nostrils daily. (Patient not taking: Reported on 06/20/2019)   No facility-administered encounter medications on file as of 06/20/2019.     Activities of Daily Living In your present state of health, do you have any difficulty performing the following activities: 06/20/2019  Hearing? N  Vision? N  Difficulty concentrating or making decisions? N  Walking or climbing stairs? Y  Comment Due to back and knee pains.  Dressing or bathing? N  Doing errands, shopping? N  Preparing Food and eating ? N  Using the Toilet? N  In the past six months, have you accidently leaked urine? N  Do you have problems with loss of bowel control? N  Managing your Medications? N  Managing your Finances? N  Housekeeping or managing your Housekeeping? N  Some recent data might be hidden    Patient Care Team: Jerrol Banana., MD as PCP - General (Family Medicine) Anell Barr, OD as Consulting Physician (Optometry) Kristeen Miss, MD as Consulting Physician (Neurosurgery) Marry Guan, Laurice Record, MD (Orthopedic Surgery) Pieter Partridge, MD as Referring Physician (Hematology)   Assessment:   This is a routine wellness examination for Daniel Owens.  Exercise Activities and Dietary recommendations Current Exercise Habits: Home exercise routine, Type of exercise: walking, Time (Minutes): 35, Frequency (Times/Week): 7, Weekly Exercise (Minutes/Week): 245, Intensity: Mild, Exercise limited by: orthopedic condition(s)  Goals    . Increase water intake     Recommend increasing water  intake to 4-6 glasses a day.     . Reduce caffeine intake     Recommend to cut back on tea intake and avoid completely at bedtime.        Fall Risk: Fall Risk  06/20/2019 06/14/2018 06/12/2017 06/10/2016 06/05/2015  Falls in the past year? 0 No Yes Yes No  Number falls in past yr: - - 1 1 -  Injury with Fall? - - Yes No -  Comment - - 2 broken vertebreas  - -  Follow up - - Falls prevention discussed - -    FALL RISK PREVENTION PERTAINING TO THE HOME:  Any stairs in or around the home? Yes  If so, are there any without handrails? No   Home free of loose throw rugs in walkways, pet beds, electrical cords, etc? Yes  Adequate lighting in your home to reduce risk of falls? Yes   ASSISTIVE DEVICES UTILIZED TO PREVENT FALLS:  Life alert? No  Use of a cane, walker or w/c? No  Grab bars in the bathroom? Yes  Shower chair or bench in shower? Yes  Elevated toilet seat or a handicapped toilet? No   TIMED UP AND GO:  Was the test performed? No .    Depression Screen PHQ 2/9 Scores 06/20/2019 06/14/2018 06/12/2017 06/10/2016  PHQ - 2 Score 0 0 0 0    Cognitive Function     6CIT Screen 06/20/2019 06/14/2018 06/12/2017  What Year? 0 points 0 points 0 points  What month? 0 points 0 points 0 points  What time? 0 points 0 points 0 points  Count back from 20 0 points 0 points 0 points  Months in reverse 0 points 0 points 0 points  Repeat phrase 0 points 0 points 2 points  Total Score 0 0 2    Immunization History  Administered Date(s) Administered  . Influenza, High Dose Seasonal PF 07/30/2015, 07/30/2017, 07/27/2018  . Pneumococcal Conjugate-13 08/08/2014  . Pneumococcal Polysaccharide-23 11/15/2012  . Td 06/12/2016  . Zoster 04/09/2010    Qualifies for  Shingles Vaccine? Yes  Zostavax completed 04/09/10. Due for Shingrix. Education has been provided regarding the importance of this vaccine. Pt has been advised to call insurance company to determine out of pocket expense. Advised may  also receive vaccine at local pharmacy or Health Dept. Verbalized acceptance and understanding.  Tdap: Up to date  Flu Vaccine: Due for Flu vaccine. Does the patient want to receive this vaccine today?  No .   Pneumococcal Vaccine: Completed series  Screening Tests Health Maintenance  Topic Date Due  . INFLUENZA VACCINE  05/21/2019  . COLONOSCOPY  12/25/2022  . TETANUS/TDAP  06/12/2026  . Hepatitis C Screening  Completed  . PNA vac Low Risk Adult  Completed   Cancer Screenings:  Colorectal Screening: Completed 12/24/12. Repeat every 10 years.  Lung Cancer Screening: (Low Dose CT Chest recommended if Age 41-80 years, 30 pack-year currently smoking OR have quit w/in 15years.) does not qualify.   Additional Screening:  Hepatitis C Screening: Up to date  Vision Screening: Recommended annual ophthalmology exams for early detection of glaucoma and other disorders of the eye.  Dental Screening: Recommended annual dental exams for proper oral hygiene  Community Resource Referral:  CRR required this visit?  No        Plan:  I have personally reviewed and addressed the Medicare Annual Wellness questionnaire and have noted the following in the patient's chart:  A. Medical and social history B. Use of alcohol, tobacco or illicit drugs  C. Current medications and supplements D. Functional ability and status E.  Nutritional status F.  Physical activity G. Advance directives H. List of other physicians I.  Hospitalizations, surgeries, and ER visits in previous 12 months J.  [place] such as hearing and vision if needed, cognitive and depression L. Referrals and appointments   In addition, I have reviewed and discussed with patient certain preventive protocols, quality metrics, and best practice recommendations. A written personalized care plan for preventive services as well as general preventive health recommendations were provided to patient.   [place]endora Score, [place]  [id number] Nurse Health Advisor   Nurse Notes: Due for an influenza vaccine at next in office visit.

## 2019-06-20 ENCOUNTER — Ambulatory Visit (INDEPENDENT_AMBULATORY_CARE_PROVIDER_SITE_OTHER): Payer: Medicare Other

## 2019-06-20 ENCOUNTER — Other Ambulatory Visit: Payer: Self-pay

## 2019-06-20 DIAGNOSIS — Z0000 Encounter for general adult medical examination without abnormal findings: Secondary | ICD-10-CM

## 2019-06-20 NOTE — Patient Instructions (Signed)
Daniel Owens , Thank you for taking time to come for your Medicare Wellness Visit. I appreciate your ongoing commitment to your health goals. Please review the following plan we discussed and let me know if I can assist you in the future.   Screening recommendations/referrals: Colonoscopy: Up to date, due 12/2022 Recommended yearly ophthalmology/optometry visit for glaucoma screening and checkup Recommended yearly dental visit for hygiene and checkup  Vaccinations: Influenza vaccine: Currently due Pneumococcal vaccine: Completed series Tdap vaccine: Up to date, due 05/2026 Shingles vaccine: Pt declines today.     Advanced directives: Please bring a copy of your POA (Power of Attorney) and/or Living Will to your next appointment.   Conditions/risks identified: Continue to cut back on tea consumption before bed.   Next appointment: 08/29/19 @ 9:00 AM with [name]  Preventive Care 72 Years and Older, Male Preventive care refers to lifestyle choices and visits with your health care provider that can promote health and wellness. What does preventive care include?  A yearly physical exam. This is also called an annual well check.  Dental exams once or twice a year.  Routine eye exams. Ask your health care provider how often you should have your eyes checked.  Personal lifestyle choices, including:  Daily care of your teeth and gums.  Regular physical activity.  Eating a healthy diet.  Avoiding tobacco and drug use.  Limiting alcohol use.  Practicing safe sex.  Taking low doses of aspirin every day.  Taking vitamin and mineral supplements as recommended by your health care provider. What happens during an annual well check? The services and screenings done by your health care provider during your annual well check will depend on your age, overall health, lifestyle risk factors, and family history of disease. Counseling  Your health care provider may ask you questions about your:   Alcohol use.  Tobacco use.  Drug use.  Emotional well-being.  Home and relationship well-being.  Sexual activity.  Eating habits.  History of falls.  Memory and ability to understand (cognition).  Work and work Statistician. Screening  You may have the following tests or measurements:  Height, weight, and BMI.  Blood pressure.  Lipid and cholesterol levels. These may be checked every 5 years, or more frequently if you are over 22 years old.  Skin check.  Lung cancer screening. You may have this screening every year starting at age 66 if you have a 30-pack-year history of smoking and currently smoke or have quit within the past 15 years.  Fecal occult blood test (FOBT) of the stool. You may have this test every year starting at age 40.  Flexible sigmoidoscopy or colonoscopy. You may have a sigmoidoscopy every 5 years or a colonoscopy every 10 years starting at age 67.  Prostate cancer screening. Recommendations will vary depending on your family history and other risks.  Hepatitis C blood test.  Hepatitis B blood test.  Sexually transmitted disease (STD) testing.  Diabetes screening. This is done by checking your blood sugar (glucose) after you have not eaten for a while (fasting). You may have this done every 1-3 years.  Abdominal aortic aneurysm (AAA) screening. You may need this if you are a current or former smoker.  Osteoporosis. You may be screened starting at age 80 if you are at high risk. Talk with your health care provider about your test results, treatment options, and if necessary, the need for more tests. Vaccines  Your health care provider may recommend certain vaccines, such as:  Influenza vaccine. This is recommended every year.  Tetanus, diphtheria, and acellular pertussis (Tdap, Td) vaccine. You may need a Td booster every 10 years.  Zoster vaccine. You may need this after age 41.  Pneumococcal 13-valent conjugate (PCV13) vaccine. One dose  is recommended after age 56.  Pneumococcal polysaccharide (PPSV23) vaccine. One dose is recommended after age 89. Talk to your health care provider about which screenings and vaccines you need and how often you need them. This information is not intended to replace advice given to you by your health care provider. Make sure you discuss any questions you have with your health care provider. Document Released: 11/02/2015 Document Revised: 06/25/2016 Document Reviewed: 08/07/2015 Elsevier Interactive Patient Education  2017 Cut [employer] Prevention in the Home Falls can cause injuries. They can happen to people of all ages. There are many things you can do to make your home safe and to help prevent falls. What can I do on the outside of my home?  Regularly fix the edges of walkways and driveways and fix any cracks.  Remove anything that might make you trip as you walk through a door, such as a raised step or threshold.  Trim any bushes or trees on the path to your home.  Use bright outdoor lighting.  Clear any walking paths of anything that might make someone trip, such as rocks or tools.  Regularly check to see if handrails are loose or broken. Make sure that both sides of any steps have handrails.  Any raised decks and porches should have guardrails on the edges.  Have any leaves, snow, or ice cleared regularly.  Use sand or salt on walking paths during winter.  Clean up any spills in your garage right away. This includes oil or grease spills. What can I do in the bathroom?  Use night lights.  Install grab bars by the toilet and in the tub and shower. Do not use towel bars as grab bars.  Use non-skid mats or decals in the tub or shower.  If you need to sit down in the shower, use a plastic, non-slip stool.  Keep the floor dry. Clean up any water that spills on the floor as soon as it happens.  Remove soap buildup in the tub or shower regularly.  Attach bath mats  securely with double-sided non-slip rug tape.  Do not have throw rugs and other things on the floor that can make you trip. What can I do in the bedroom?  Use night lights.  Make sure that you have a light by your bed that is easy to reach.  Do not use any sheets or blankets that are too big for your bed. They should not hang down onto the floor.  Have a firm chair that has side arms. You can use this for support while you get dressed.  Do not have throw rugs and other things on the floor that can make you trip. What can I do in the kitchen?  Clean up any spills right away.  Avoid walking on wet floors.  Keep items that you use a lot in easy-to-reach places.  If you need to reach something above you, use a strong step stool that has a grab bar.  Keep electrical cords out of the way.  Do not use floor polish or wax that makes floors slippery. If you must use wax, use non-skid floor wax.  Do not have throw rugs and other things on the floor that  can make you trip. What can I do with my stairs?  Do not leave any items on the stairs.  Make sure that there are handrails on both sides of the stairs and use them. Fix handrails that are broken or loose. Make sure that handrails are as long as the stairways.  Check any carpeting to make sure that it is firmly attached to the stairs. Fix any carpet that is loose or worn.  Avoid having throw rugs at the top or bottom of the stairs. If you do have throw rugs, attach them to the floor with carpet tape.  Make sure that you have a light switch at the top of the stairs and the bottom of the stairs. If you do not have them, ask someone to add them for you. What else can I do to help prevent falls?  Wear shoes that:  Do not have high heels.  Have rubber bottoms.  Are comfortable and fit you well.  Are closed at the toe. Do not wear sandals.  If you use a stepladder:  Make sure that it is fully opened. Do not climb a closed  stepladder.  Make sure that both sides of the stepladder are locked into place.  Ask someone to hold it for you, if possible.  Clearly mark and make sure that you can see:  Any grab bars or handrails.  First and last steps.  Where the edge of each step is.  Use tools that help you move around (mobility aids) if they are needed. These include:  Canes.  Walkers.  Scooters.  Crutches.  Turn on the lights when you go into a dark area. Replace any light bulbs as soon as they burn out.  Set up your furniture so you have a clear path. Avoid moving your furniture around.  If any of your floors are uneven, fix them.  If there are any pets around you, be aware of where they are.  Review your medicines with your doctor. Some medicines can make you feel dizzy. This can increase your chance of falling. Ask your doctor what other things that you can do to help prevent falls. This information is not intended to replace advice given to you by your health care provider. Make sure you discuss any questions you have with your health care provider. Document Released: 08/02/2009 Document Revised: 03/13/2016 Document Reviewed: 11/10/2014 Elsevier Interactive Patient Education  2017 [employer].

## 2019-06-22 DIAGNOSIS — S46012A Strain of muscle(s) and tendon(s) of the rotator cuff of left shoulder, initial encounter: Secondary | ICD-10-CM | POA: Diagnosis not present

## 2019-06-24 ENCOUNTER — Other Ambulatory Visit: Payer: Self-pay | Admitting: *Deleted

## 2019-06-24 DIAGNOSIS — G8929 Other chronic pain: Secondary | ICD-10-CM

## 2019-06-28 MED ORDER — OXYCODONE HCL 15 MG PO TABS: 15.0000 mg | ORAL_TABLET | Freq: Four times a day (QID) | ORAL | 0 refills | Status: DC | PRN

## 2019-06-29 DIAGNOSIS — M75102 Unspecified rotator cuff tear or rupture of left shoulder, not specified as traumatic: Secondary | ICD-10-CM | POA: Diagnosis not present

## 2019-06-29 DIAGNOSIS — S46012D Strain of muscle(s) and tendon(s) of the rotator cuff of left shoulder, subsequent encounter: Secondary | ICD-10-CM | POA: Diagnosis not present

## 2019-06-29 DIAGNOSIS — M7582 Other shoulder lesions, left shoulder: Secondary | ICD-10-CM | POA: Diagnosis not present

## 2019-06-29 DIAGNOSIS — M25412 Effusion, left shoulder: Secondary | ICD-10-CM | POA: Diagnosis not present

## 2019-06-30 DIAGNOSIS — S46012A Strain of muscle(s) and tendon(s) of the rotator cuff of left shoulder, initial encounter: Secondary | ICD-10-CM | POA: Insufficient documentation

## 2019-06-30 DIAGNOSIS — S46012D Strain of muscle(s) and tendon(s) of the rotator cuff of left shoulder, subsequent encounter: Secondary | ICD-10-CM | POA: Diagnosis not present

## 2019-07-01 ENCOUNTER — Other Ambulatory Visit: Payer: Self-pay | Admitting: Family Medicine

## 2019-07-01 DIAGNOSIS — Z23 Encounter for immunization: Secondary | ICD-10-CM | POA: Diagnosis not present

## 2019-07-01 NOTE — Telephone Encounter (Signed)
[facility] faxed refill request for the following medications:  celecoxib (CELEBREX) 200 MG capsule  LOV: 02/21/2019 Please advise. Thanks TNP

## 2019-07-04 MED ORDER — CELECOXIB 100 MG PO CAPS: 100.0000 mg | ORAL_CAPSULE | Freq: Every day | ORAL | 5 refills | Status: DC | PRN

## 2019-07-05 ENCOUNTER — Encounter: Payer: Medicare Other | Admitting: Family Medicine

## 2019-07-05 DIAGNOSIS — D539 Nutritional anemia, unspecified: Secondary | ICD-10-CM | POA: Diagnosis not present

## 2019-07-05 DIAGNOSIS — I10 Essential (primary) hypertension: Secondary | ICD-10-CM | POA: Diagnosis not present

## 2019-07-05 DIAGNOSIS — M545 Low back pain: Secondary | ICD-10-CM | POA: Diagnosis not present

## 2019-07-05 DIAGNOSIS — G8929 Other chronic pain: Secondary | ICD-10-CM | POA: Diagnosis not present

## 2019-07-08 ENCOUNTER — Telehealth: Payer: Self-pay | Admitting: Family Medicine

## 2019-07-08 NOTE — Telephone Encounter (Signed)
FYI  Pt fell and hurt his shoulder around Labor Day and is going to have to have surgery because of torn tendons.  Dr. Jeannie Fend at [facility] is doing the surgery.   He's having surgery on the 22nd.  [place]

## 2019-07-09 DIAGNOSIS — Z01812 Encounter for preprocedural laboratory examination: Secondary | ICD-10-CM | POA: Diagnosis not present

## 2019-07-09 DIAGNOSIS — Z20828 Contact with and (suspected) exposure to other viral communicable diseases: Secondary | ICD-10-CM | POA: Diagnosis not present

## 2019-07-09 DIAGNOSIS — Z1159 Encounter for screening for other viral diseases: Secondary | ICD-10-CM | POA: Diagnosis not present

## 2019-07-12 DIAGNOSIS — I10 Essential (primary) hypertension: Secondary | ICD-10-CM | POA: Diagnosis not present

## 2019-07-12 DIAGNOSIS — S46112A Strain of muscle, fascia and tendon of long head of biceps, left arm, initial encounter: Secondary | ICD-10-CM | POA: Diagnosis not present

## 2019-07-12 DIAGNOSIS — Z8262 Family history of osteoporosis: Secondary | ICD-10-CM | POA: Diagnosis not present

## 2019-07-12 DIAGNOSIS — F17220 Nicotine dependence, chewing tobacco, uncomplicated: Secondary | ICD-10-CM | POA: Diagnosis not present

## 2019-07-12 DIAGNOSIS — M19011 Primary osteoarthritis, right shoulder: Secondary | ICD-10-CM | POA: Diagnosis not present

## 2019-07-12 DIAGNOSIS — K219 Gastro-esophageal reflux disease without esophagitis: Secondary | ICD-10-CM | POA: Diagnosis not present

## 2019-07-12 DIAGNOSIS — S46812A Strain of other muscles, fascia and tendons at shoulder and upper arm level, left arm, initial encounter: Secondary | ICD-10-CM | POA: Diagnosis not present

## 2019-07-12 DIAGNOSIS — S46012D Strain of muscle(s) and tendon(s) of the rotator cuff of left shoulder, subsequent encounter: Secondary | ICD-10-CM | POA: Diagnosis not present

## 2019-07-12 DIAGNOSIS — G8918 Other acute postprocedural pain: Secondary | ICD-10-CM | POA: Diagnosis not present

## 2019-07-12 DIAGNOSIS — M19012 Primary osteoarthritis, left shoulder: Secondary | ICD-10-CM | POA: Diagnosis not present

## 2019-07-12 DIAGNOSIS — Z981 Arthrodesis status: Secondary | ICD-10-CM | POA: Diagnosis not present

## 2019-07-22 ENCOUNTER — Telehealth: Payer: Self-pay | Admitting: Family Medicine

## 2019-07-22 DIAGNOSIS — M25512 Pain in left shoulder: Secondary | ICD-10-CM | POA: Diagnosis not present

## 2019-07-22 DIAGNOSIS — M75122 Complete rotator cuff tear or rupture of left shoulder, not specified as traumatic: Secondary | ICD-10-CM | POA: Diagnosis not present

## 2019-07-22 DIAGNOSIS — G8929 Other chronic pain: Secondary | ICD-10-CM

## 2019-07-22 NOTE — Telephone Encounter (Addendum)
We received a fax from after hours service stating that patient is requesting a refill on the following medication  oxyCODONE (ROXICODONE) 15 MG immediate release tablet   Please advise.

## 2019-07-22 NOTE — Telephone Encounter (Signed)
Please review. Thanks!  

## 2019-07-25 ENCOUNTER — Other Ambulatory Visit: Payer: Self-pay | Admitting: Family Medicine

## 2019-07-25 DIAGNOSIS — G8929 Other chronic pain: Secondary | ICD-10-CM

## 2019-07-25 DIAGNOSIS — M75122 Complete rotator cuff tear or rupture of left shoulder, not specified as traumatic: Secondary | ICD-10-CM | POA: Diagnosis not present

## 2019-07-25 DIAGNOSIS — M25512 Pain in left shoulder: Secondary | ICD-10-CM | POA: Diagnosis not present

## 2019-07-25 MED ORDER — OXYCODONE HCL 15 MG PO TABS: 15.0000 mg | ORAL_TABLET | Freq: Four times a day (QID) | ORAL | 0 refills | Status: DC | PRN

## 2019-07-25 NOTE — Telephone Encounter (Signed)
Please review. Thanks!  

## 2019-07-25 NOTE — Telephone Encounter (Signed)
Pt needing a refill on:  oxyCODONE (ROXICODONE) 15 MG immediate release tablet - needing to be filled by the 8th.  Please fill at :  [facility] [place], [place] [phone] (Phone) [phone] (Fax)   Thanks, [employer]

## 2019-07-28 DIAGNOSIS — M25512 Pain in left shoulder: Secondary | ICD-10-CM | POA: Diagnosis not present

## 2019-07-28 DIAGNOSIS — M75122 Complete rotator cuff tear or rupture of left shoulder, not specified as traumatic: Secondary | ICD-10-CM | POA: Diagnosis not present

## 2019-08-03 DIAGNOSIS — M25512 Pain in left shoulder: Secondary | ICD-10-CM | POA: Diagnosis not present

## 2019-08-03 DIAGNOSIS — M75122 Complete rotator cuff tear or rupture of left shoulder, not specified as traumatic: Secondary | ICD-10-CM | POA: Diagnosis not present

## 2019-08-05 DIAGNOSIS — M75122 Complete rotator cuff tear or rupture of left shoulder, not specified as traumatic: Secondary | ICD-10-CM | POA: Diagnosis not present

## 2019-08-05 DIAGNOSIS — M25512 Pain in left shoulder: Secondary | ICD-10-CM | POA: Diagnosis not present

## 2019-08-08 DIAGNOSIS — M25512 Pain in left shoulder: Secondary | ICD-10-CM | POA: Diagnosis not present

## 2019-08-08 DIAGNOSIS — M75122 Complete rotator cuff tear or rupture of left shoulder, not specified as traumatic: Secondary | ICD-10-CM | POA: Diagnosis not present

## 2019-08-11 DIAGNOSIS — M25512 Pain in left shoulder: Secondary | ICD-10-CM | POA: Diagnosis not present

## 2019-08-11 DIAGNOSIS — M75122 Complete rotator cuff tear or rupture of left shoulder, not specified as traumatic: Secondary | ICD-10-CM | POA: Diagnosis not present

## 2019-08-15 DIAGNOSIS — M25512 Pain in left shoulder: Secondary | ICD-10-CM | POA: Diagnosis not present

## 2019-08-15 DIAGNOSIS — M75122 Complete rotator cuff tear or rupture of left shoulder, not specified as traumatic: Secondary | ICD-10-CM | POA: Diagnosis not present

## 2019-08-16 ENCOUNTER — Other Ambulatory Visit: Payer: Self-pay | Admitting: Family Medicine

## 2019-08-18 DIAGNOSIS — M75122 Complete rotator cuff tear or rupture of left shoulder, not specified as traumatic: Secondary | ICD-10-CM | POA: Diagnosis not present

## 2019-08-18 DIAGNOSIS — M25512 Pain in left shoulder: Secondary | ICD-10-CM | POA: Diagnosis not present

## 2019-08-22 ENCOUNTER — Telehealth: Payer: Self-pay

## 2019-08-22 DIAGNOSIS — G8929 Other chronic pain: Secondary | ICD-10-CM

## 2019-08-22 DIAGNOSIS — M75122 Complete rotator cuff tear or rupture of left shoulder, not specified as traumatic: Secondary | ICD-10-CM | POA: Diagnosis not present

## 2019-08-22 DIAGNOSIS — M25512 Pain in left shoulder: Secondary | ICD-10-CM | POA: Diagnosis not present

## 2019-08-22 NOTE — Telephone Encounter (Signed)
Pt contacted office for refill request on the following medications:  oxyCODONE (ROXICODONE) 15 MG immediate release tablet  [facility] Last Rx: 07/25/2019 LOV: 02/21/2019 NOV: 08/29/2019 Please advise. Thanks TNP

## 2019-08-22 NOTE — Telephone Encounter (Signed)
Please review. KW 

## 2019-08-24 NOTE — Telephone Encounter (Signed)
Pt calling back to check on his oxyCODONE (ROXICODONE) 15 MG immediate release tablet. Pt hasn't heard back on if it was being done.  Thanks, [employer]

## 2019-08-25 ENCOUNTER — Other Ambulatory Visit: Payer: Self-pay

## 2019-08-25 DIAGNOSIS — M25512 Pain in left shoulder: Secondary | ICD-10-CM | POA: Diagnosis not present

## 2019-08-25 DIAGNOSIS — M75122 Complete rotator cuff tear or rupture of left shoulder, not specified as traumatic: Secondary | ICD-10-CM | POA: Diagnosis not present

## 2019-08-25 MED ORDER — OXYCODONE HCL 15 MG PO TABS: 15.0000 mg | ORAL_TABLET | Freq: Four times a day (QID) | ORAL | 0 refills | Status: DC | PRN

## 2019-08-25 NOTE — Telephone Encounter (Signed)
Pt calling back on this wanting know why his Rx - OXYCODONE has not been called in yet. He had shoulder surgery and is in pain needing to take the Rx. He is out of medication.  Pt wants a call back to let him know why and wants it know when it's called in.  Thanks, [employer]

## 2019-08-25 NOTE — Telephone Encounter (Signed)
Please review. Thanks!  

## 2019-08-25 NOTE — Telephone Encounter (Signed)
Patient called back to check on this request. He says he has called our office 3 times for it to be refilled. Patient is completely out and needs this sent in asap. As I was talking with patient, Dr. [name] signed off on refill request. Patient advised that prescription has been sent to pharmacy.

## 2019-08-25 NOTE — Telephone Encounter (Signed)
Opened in error

## 2019-08-25 NOTE — Progress Notes (Signed)
Patient: Daniel Owens Male    DOB: May 27, 1946   73 y.o.   MRN: [id number] Visit Date: 08/29/2019  Today's Provider: [name], MD   Chief Complaint  Patient presents with  . Follow up to [facility]   Subjective:     Patient had AWV with [facility] 06/20/2019.  HPI  Patient has multiple issues today.  He fell recently and had to have his left shoulder reconstructed on 07/12/2019 .  He wants a PSA drawn because he has a nocturia x3-4 every night.  He has chronic venous insufficiency and chronic low back pain with surgeries.  He has been on chronic narcotics for years for his low back pain. Anemia, unspecified type From 02/21/2019-Per hematology. Labs checked, stable.  Macrocytosis followed by hematology at [place] hypertension From 02/21/2019-Labs checked, stable.  BP Readings from Last 3 Encounters:  08/29/19 126/64  06/13/19 (!) 167/48  02/21/19 122/70    Pure hypercholesterolemia From 02/21/2019-Labs checked, stable.   Lab Results  Component Value Date   CHOL 133 02/22/2019   HDL 74 02/22/2019   LDLCALC 39 02/22/2019   TRIG 101 02/22/2019   CHOLHDL 1.8 02/22/2019    Chronic bilateral low back pain with bilateral sciatica From 02/21/2019-Discussed maybe in future cutting back on dose. Patient has been on oxycodone 15 mg for many years. Patient just had shoulder surgery on 07/12/2019. He reports that the pain medication helps with this.   Chronic venous insufficiency From 02/21/2019-Recommend knee high support hose.   No Known Allergies   Current Outpatient Medications:  .  Ascorbic Acid (VITAMIN C) 1000 MG tablet, Take 1,000 mg by mouth daily., Disp: , Rfl:  .  B Complex Vitamins (VITAMIN B COMPLEX PO), Take 1 tablet by mouth daily., Disp: , Rfl:  .  beta carotene 25000 UNIT capsule, Take 25,000 Units by mouth daily., Disp: , Rfl:  .  celecoxib (CELEBREX) 100 MG capsule, Take 1 capsule (100 mg total) by mouth daily as needed., Disp: 30 capsule, Rfl: 5 .  fish  oil-omega-3 fatty acids 1000 MG capsule, Take 1 g by mouth daily., Disp: , Rfl:  .  folic acid (FOLVITE) A999333 MCG tablet, Take 1 tablet (400 mcg total) by mouth daily., Disp: 30 tablet, Rfl: 0 .  GLUCOSAMINE-CHONDROIT-VIT C-MN PO, Take by mouth daily. , Disp: , Rfl:  .  losartan (COZAAR) 25 MG tablet, TAKE 1 TABLET BY MOUTH DAILY, Disp: 90 tablet, Rfl: 3 .  metoprolol succinate (TOPROL-XL) 50 MG 24 hr tablet, TAKE 1 TABLET BY MOUTH ONCE DAILY, WITH OR IMMEDIATELY FOLLOWING A MEAL, Disp: 90 tablet, Rfl: 3 .  Multiple Vitamin (MULTIVITAMIN WITH MINERALS) TABS tablet, Take 1 tablet by mouth daily., Disp: , Rfl:  .  nicotine polacrilex (NICORETTE) 4 MG gum, Take 1 each (4 mg total) by mouth as needed for smoking cessation., Disp: 100 tablet, Rfl: 0 .  oxyCODONE (ROXICODONE) 15 MG immediate release tablet, Take 1 tablet (15 mg total) by mouth every 6 (six) hours as needed for pain., Disp: 120 tablet, Rfl: 0 .  rosuvastatin (CRESTOR) 5 MG tablet, TAKE 1 TABLET(5 MG) BY MOUTH DAILY, Disp: 90 tablet, Rfl: 3 .  Selenium 200 MCG TABS, Take 200 mcg by mouth daily., Disp: , Rfl:  .  tadalafil (CIALIS) 5 MG tablet, TAKE 1 TABLET BY MOUTH DAILY FOR BPH, Disp: 90 tablet, Rfl: 3 .  aspirin EC 81 MG tablet, Take 1 tablet (81 mg total) by mouth daily. (Patient  not taking: Reported on 06/20/2019), Disp: 90 tablet, Rfl: 3 .  fluticasone (FLONASE) 50 MCG/ACT nasal spray, Place 2 sprays into both nostrils daily. (Patient not taking: Reported on 06/20/2019), Disp: 16 g, Rfl: 6  Review of Systems  Constitutional: Negative for activity change and fatigue.  HENT: Negative.   Eyes: Negative.   Respiratory: Negative for cough and shortness of breath.   Cardiovascular: Negative for chest pain, palpitations and leg swelling.  Gastrointestinal: Negative.   Endocrine: Negative.   Genitourinary: Positive for decreased urine volume.  Musculoskeletal: Positive for arthralgias, back pain, joint swelling, myalgias, neck pain and  neck stiffness.  Allergic/Immunologic: Negative.   Neurological: Negative for dizziness, light-headedness and headaches.  Hematological: Bruises/bleeds easily.  Psychiatric/Behavioral: Negative.     Social History   Tobacco Use  . Smoking status: Former Smoker    Types: Cigarettes    Quit date: 10/19/1977    Years since quitting: 41.8  . Smokeless tobacco: Current User    Types: Chew  . Tobacco comment: trying to quit  Substance Use Topics  . Alcohol use: Yes    Alcohol/week: 0.0 standard drinks    Comment: very seldom- wine      Objective:   BP 126/64   Pulse 64   Temp 97.9 F (36.6 C)   Resp 16   Ht 6' 0.5" (1.842 m)   Wt 159 lb (72.1 kg)   SpO2 98%   BMI 21.27 kg/m  Vitals:   08/29/19 0915  BP: 126/64  Pulse: 64  Resp: 16  Temp: 97.9 F (36.6 C)  SpO2: 98%  Weight: 159 lb (72.1 kg)  Height: 6' 0.5" (1.842 m)  Body mass index is 21.27 kg/m.   Physical Exam Vitals signs reviewed.  Constitutional:      Appearance: He is well-developed.  HENT:     Head: Normocephalic and atraumatic.     Right Ear: External ear normal.     Left Ear: External ear normal.     Nose: Nose normal.  Eyes:     General: No scleral icterus.    Conjunctiva/sclera: Conjunctivae normal.  Neck:     Thyroid: No thyromegaly.  Cardiovascular:     Rate and Rhythm: Normal rate and regular rhythm.     Heart sounds: Normal heart sounds.  Pulmonary:     Effort: Pulmonary effort is normal.     Breath sounds: Normal breath sounds.  Abdominal:     Palpations: Abdomen is soft.  Musculoskeletal:     Comments: Left arm is in a sling from shoulder surgery.  Skin:    General: Skin is warm and dry.  Neurological:     General: No focal deficit present.     Mental Status: He is alert and oriented to person, place, and time.  Psychiatric:        Mood and Affect: Mood normal.        Behavior: Behavior normal.        Thought Content: Thought content normal.        Judgment: Judgment  normal.      No results found for any visits on 08/29/19.     Assessment & Plan    1. Essential hypertension Controlled on losartan and metoprolol - Comprehensive Metabolic Panel (CMET) - Lipid Profile - TSH  2. Anemia, unspecified type Macrocytosis followed by hematology - CBC with Differential  3. Chronic bilateral low back pain with bilateral sciatica Refill chronic narcotics.  I have reviewed PD MP  4. Macrocytosis  -  CBC with Differential  5. BPH associated with nocturia Check PSA and try Flomax - PSA More than 50% 40 minute visit spent in counseling or coordination of care  6. Other chronic pain Chronic shoulder pain in addition to back pain. - oxyCODONE (ROXICODONE) 15 MG immediate release tablet; Take 1 tablet (15 mg total) by mouth every 4 (four) hours as needed for pain.  Dispense: 120 tablet; Refill: 0 - oxyCODONE (ROXICODONE) 15 MG immediate release tablet; Take 1 tablet (15 mg total) by mouth every 6 (six) hours as needed for pain.  Dispense: 120 tablet; Refill: 0 - oxyCODONE (ROXICODONE) 15 MG immediate release tablet; Take 1 tablet (15 mg total) by mouth every 4 (four) hours as needed for pain.  Dispense: 120 tablet; Refill: 0 7.  Hypercholesterolemia On Crestor    Richard Cranford Mon, MD  [facility]

## 2019-08-29 ENCOUNTER — Encounter: Payer: Self-pay | Admitting: Family Medicine

## 2019-08-29 ENCOUNTER — Ambulatory Visit (INDEPENDENT_AMBULATORY_CARE_PROVIDER_SITE_OTHER): Payer: Medicare Other | Admitting: Family Medicine

## 2019-08-29 ENCOUNTER — Other Ambulatory Visit: Payer: Self-pay

## 2019-08-29 VITALS — BP 126/64 | HR 64 | Temp 97.9°F | Resp 16 | Ht 72.5 in | Wt 159.0 lb

## 2019-08-29 DIAGNOSIS — I10 Essential (primary) hypertension: Secondary | ICD-10-CM

## 2019-08-29 DIAGNOSIS — M5441 Lumbago with sciatica, right side: Secondary | ICD-10-CM

## 2019-08-29 DIAGNOSIS — R351 Nocturia: Secondary | ICD-10-CM | POA: Diagnosis not present

## 2019-08-29 DIAGNOSIS — D7589 Other specified diseases of blood and blood-forming organs: Secondary | ICD-10-CM | POA: Diagnosis not present

## 2019-08-29 DIAGNOSIS — E7800 Pure hypercholesterolemia, unspecified: Secondary | ICD-10-CM

## 2019-08-29 DIAGNOSIS — D649 Anemia, unspecified: Secondary | ICD-10-CM

## 2019-08-29 DIAGNOSIS — G8929 Other chronic pain: Secondary | ICD-10-CM | POA: Diagnosis not present

## 2019-08-29 DIAGNOSIS — N401 Enlarged prostate with lower urinary tract symptoms: Secondary | ICD-10-CM | POA: Diagnosis not present

## 2019-08-29 DIAGNOSIS — M5442 Lumbago with sciatica, left side: Secondary | ICD-10-CM

## 2019-08-29 DIAGNOSIS — M25512 Pain in left shoulder: Secondary | ICD-10-CM | POA: Diagnosis not present

## 2019-08-29 DIAGNOSIS — M75122 Complete rotator cuff tear or rupture of left shoulder, not specified as traumatic: Secondary | ICD-10-CM | POA: Diagnosis not present

## 2019-08-29 MED ORDER — OXYCODONE HCL 15 MG PO TABS: 15.0000 mg | ORAL_TABLET | ORAL | 0 refills | Status: DC | PRN

## 2019-08-29 MED ORDER — OXYCODONE HCL 15 MG PO TABS: 15.0000 mg | ORAL_TABLET | Freq: Four times a day (QID) | ORAL | 0 refills | Status: DC | PRN

## 2019-08-30 ENCOUNTER — Encounter: Payer: Self-pay | Admitting: *Deleted

## 2019-08-30 LAB — CBC WITH DIFFERENTIAL/PLATELET
Basophils Absolute: 0 x10E3/uL (ref 0.0–0.2)
Basos: 0 %
EOS (ABSOLUTE): 0.1 x10E3/uL (ref 0.0–0.4)
Eos: 2 %
Hematocrit: 34 % — ABNORMAL LOW (ref 37.5–51.0)
Hemoglobin: 11.5 g/dL — ABNORMAL LOW (ref 13.0–17.7)
Immature Grans (Abs): 0 x10E3/uL (ref 0.0–0.1)
Immature Granulocytes: 0 %
Lymphocytes Absolute: 0.9 x10E3/uL (ref 0.7–3.1)
Lymphs: 17 %
MCH: 35 pg — ABNORMAL HIGH (ref 26.6–33.0)
MCHC: 33.8 g/dL (ref 31.5–35.7)
MCV: 103 fL — ABNORMAL HIGH (ref 79–97)
Monocytes Absolute: 0.7 x10E3/uL (ref 0.1–0.9)
Monocytes: 13 %
Neutrophils Absolute: 3.6 x10E3/uL (ref 1.4–7.0)
Neutrophils: 68 %
Platelets: 178 x10E3/uL (ref 150–450)
RBC: 3.29 x10E6/uL — ABNORMAL LOW (ref 4.14–5.80)
RDW: 11.4 % — ABNORMAL LOW (ref 11.6–15.4)
WBC: 5.3 x10E3/uL (ref 3.4–10.8)

## 2019-08-30 LAB — LIPID PANEL
Chol/HDL Ratio: 2.1 ratio (ref 0.0–5.0)
Cholesterol, Total: 164 mg/dL (ref 100–199)
HDL: 79 mg/dL (ref >39)
LDL Chol Calc (NIH): 61 mg/dL (ref 0–99)
Triglycerides: 143 mg/dL (ref 0–149)
VLDL Cholesterol Cal: 24 mg/dL (ref 5–40)

## 2019-08-30 LAB — COMPREHENSIVE METABOLIC PANEL
ALT: 16 IU/L (ref 0–44)
AST: 20 IU/L (ref 0–40)
Albumin/Globulin Ratio: 2.3 — ABNORMAL HIGH (ref 1.2–2.2)
Albumin: 4.5 g/dL (ref 3.7–4.7)
Alkaline Phosphatase: 59 IU/L (ref 39–117)
BUN/Creatinine Ratio: 24 (ref 10–24)
BUN: 25 mg/dL (ref 8–27)
Bilirubin Total: 0.3 mg/dL (ref 0.0–1.2)
CO2: 26 mmol/L (ref 20–29)
Calcium: 9.2 mg/dL (ref 8.6–10.2)
Chloride: 101 mmol/L (ref 96–106)
Creatinine, Ser: 1.05 mg/dL (ref 0.76–1.27)
GFR calc Af Amer: 82 mL/min/{1.73_m2} (ref >59)
GFR calc non Af Amer: 71 mL/min/{1.73_m2} (ref >59)
Globulin, Total: 2 g/dL (ref 1.5–4.5)
Glucose: 84 mg/dL (ref 65–99)
Potassium: 4.8 mmol/L (ref 3.5–5.2)
Sodium: 139 mmol/L (ref 134–144)
Total Protein: 6.5 g/dL (ref 6.0–8.5)

## 2019-08-30 LAB — PSA: Prostate Specific Ag, Serum: 1.2 ng/mL (ref 0.0–4.0)

## 2019-08-30 LAB — TSH: TSH: 2.45 u[IU]/mL (ref 0.450–4.500)

## 2019-09-01 DIAGNOSIS — M75122 Complete rotator cuff tear or rupture of left shoulder, not specified as traumatic: Secondary | ICD-10-CM | POA: Diagnosis not present

## 2019-09-01 DIAGNOSIS — M25512 Pain in left shoulder: Secondary | ICD-10-CM | POA: Diagnosis not present

## 2019-09-05 DIAGNOSIS — Z862 Personal history of diseases of the blood and blood-forming organs and certain disorders involving the immune mechanism: Secondary | ICD-10-CM | POA: Diagnosis not present

## 2019-09-05 DIAGNOSIS — M75122 Complete rotator cuff tear or rupture of left shoulder, not specified as traumatic: Secondary | ICD-10-CM | POA: Diagnosis not present

## 2019-09-05 DIAGNOSIS — M25512 Pain in left shoulder: Secondary | ICD-10-CM | POA: Diagnosis not present

## 2019-09-05 DIAGNOSIS — D649 Anemia, unspecified: Secondary | ICD-10-CM | POA: Diagnosis not present

## 2019-09-07 DIAGNOSIS — M75122 Complete rotator cuff tear or rupture of left shoulder, not specified as traumatic: Secondary | ICD-10-CM | POA: Diagnosis not present

## 2019-09-07 DIAGNOSIS — M25512 Pain in left shoulder: Secondary | ICD-10-CM | POA: Diagnosis not present

## 2019-09-09 DIAGNOSIS — M25512 Pain in left shoulder: Secondary | ICD-10-CM | POA: Diagnosis not present

## 2019-09-09 DIAGNOSIS — M75122 Complete rotator cuff tear or rupture of left shoulder, not specified as traumatic: Secondary | ICD-10-CM | POA: Diagnosis not present

## 2019-09-12 DIAGNOSIS — M25512 Pain in left shoulder: Secondary | ICD-10-CM | POA: Diagnosis not present

## 2019-09-12 DIAGNOSIS — M75122 Complete rotator cuff tear or rupture of left shoulder, not specified as traumatic: Secondary | ICD-10-CM | POA: Diagnosis not present

## 2019-09-13 ENCOUNTER — Other Ambulatory Visit: Payer: Self-pay | Admitting: Family Medicine

## 2019-09-14 ENCOUNTER — Other Ambulatory Visit: Payer: Self-pay

## 2019-09-14 DIAGNOSIS — M25512 Pain in left shoulder: Secondary | ICD-10-CM | POA: Diagnosis not present

## 2019-09-14 DIAGNOSIS — M75122 Complete rotator cuff tear or rupture of left shoulder, not specified as traumatic: Secondary | ICD-10-CM | POA: Diagnosis not present

## 2019-09-14 NOTE — Telephone Encounter (Signed)
Okay to do 90 day supply?

## 2019-09-14 NOTE — Telephone Encounter (Signed)
Requested medication (s) are due for refill today: no  Requested medication (s) are on the active medication list:no  Last refill:  06/14/2018  Future visit scheduled:yes  Notes to clinic:  Review for refill Looks like the dose has changed    Requested Prescriptions  Pending Prescriptions Disp Refills   celecoxib (CELEBREX) 200 MG capsule [Pharmacy Med Name: CELECOXIB 200MG  CAPSULES] 90 capsule 3    Sig: TAKE 1 CAPSULE BY MOUTH EVERY DAY     Analgesics:  COX2 Inhibitors Failed - 09/13/2019  4:49 PM      Failed - HGB in normal range and within 360 days    Hemoglobin  Date Value Ref Range Status  08/29/2019 11.5 (L) 13.0 - 17.7 g/dL Final         Passed - Cr in normal range and within 360 days    Creatinine, Ser  Date Value Ref Range Status  08/29/2019 1.05 0.76 - 1.27 mg/dL Final         Passed - Patient is not pregnant      Passed - Valid encounter within last 12 months    Recent Outpatient Visits          2 weeks ago Essential hypertension   [facility] Jerrol Banana., MD   6 months ago Anemia, unspecified type   [facility] Jerrol Banana., MD   9 months ago Anemia, unspecified type   [facility] Jerrol Banana., MD   1 year ago Anemia, unspecified type   [facility] Jerrol Banana., MD   1 year ago Essential hypertension   [facility] Jerrol Banana., MD      Future Appointments            In 2 months Jerrol Banana., MD [facility], [place]

## 2019-09-16 DIAGNOSIS — M25512 Pain in left shoulder: Secondary | ICD-10-CM | POA: Diagnosis not present

## 2019-09-16 DIAGNOSIS — M75122 Complete rotator cuff tear or rupture of left shoulder, not specified as traumatic: Secondary | ICD-10-CM | POA: Diagnosis not present

## 2019-09-19 DIAGNOSIS — M25512 Pain in left shoulder: Secondary | ICD-10-CM | POA: Diagnosis not present

## 2019-09-19 DIAGNOSIS — M75122 Complete rotator cuff tear or rupture of left shoulder, not specified as traumatic: Secondary | ICD-10-CM | POA: Diagnosis not present

## 2019-09-20 DIAGNOSIS — L218 Other seborrheic dermatitis: Secondary | ICD-10-CM | POA: Diagnosis not present

## 2019-09-20 DIAGNOSIS — D2272 Melanocytic nevi of left lower limb, including hip: Secondary | ICD-10-CM | POA: Diagnosis not present

## 2019-09-20 DIAGNOSIS — X32XXXA Exposure to sunlight, initial encounter: Secondary | ICD-10-CM | POA: Diagnosis not present

## 2019-09-20 DIAGNOSIS — D2261 Melanocytic nevi of right upper limb, including shoulder: Secondary | ICD-10-CM | POA: Diagnosis not present

## 2019-09-20 DIAGNOSIS — L820 Inflamed seborrheic keratosis: Secondary | ICD-10-CM | POA: Diagnosis not present

## 2019-09-20 DIAGNOSIS — Z85828 Personal history of other malignant neoplasm of skin: Secondary | ICD-10-CM | POA: Diagnosis not present

## 2019-09-20 DIAGNOSIS — D225 Melanocytic nevi of trunk: Secondary | ICD-10-CM | POA: Diagnosis not present

## 2019-09-20 DIAGNOSIS — L570 Actinic keratosis: Secondary | ICD-10-CM | POA: Diagnosis not present

## 2019-09-20 DIAGNOSIS — D2262 Melanocytic nevi of left upper limb, including shoulder: Secondary | ICD-10-CM | POA: Diagnosis not present

## 2019-09-21 DIAGNOSIS — M25512 Pain in left shoulder: Secondary | ICD-10-CM | POA: Diagnosis not present

## 2019-09-21 DIAGNOSIS — M75122 Complete rotator cuff tear or rupture of left shoulder, not specified as traumatic: Secondary | ICD-10-CM | POA: Diagnosis not present

## 2019-09-23 DIAGNOSIS — M75122 Complete rotator cuff tear or rupture of left shoulder, not specified as traumatic: Secondary | ICD-10-CM | POA: Diagnosis not present

## 2019-09-23 DIAGNOSIS — M25512 Pain in left shoulder: Secondary | ICD-10-CM | POA: Diagnosis not present

## 2019-09-26 DIAGNOSIS — M25512 Pain in left shoulder: Secondary | ICD-10-CM | POA: Diagnosis not present

## 2019-09-26 DIAGNOSIS — M75122 Complete rotator cuff tear or rupture of left shoulder, not specified as traumatic: Secondary | ICD-10-CM | POA: Diagnosis not present

## 2019-09-28 DIAGNOSIS — M25512 Pain in left shoulder: Secondary | ICD-10-CM | POA: Diagnosis not present

## 2019-09-28 DIAGNOSIS — M75122 Complete rotator cuff tear or rupture of left shoulder, not specified as traumatic: Secondary | ICD-10-CM | POA: Diagnosis not present

## 2019-09-30 DIAGNOSIS — M75122 Complete rotator cuff tear or rupture of left shoulder, not specified as traumatic: Secondary | ICD-10-CM | POA: Diagnosis not present

## 2019-09-30 DIAGNOSIS — M25512 Pain in left shoulder: Secondary | ICD-10-CM | POA: Diagnosis not present

## 2019-10-03 DIAGNOSIS — M25512 Pain in left shoulder: Secondary | ICD-10-CM | POA: Diagnosis not present

## 2019-10-03 DIAGNOSIS — M75122 Complete rotator cuff tear or rupture of left shoulder, not specified as traumatic: Secondary | ICD-10-CM | POA: Diagnosis not present

## 2019-10-05 DIAGNOSIS — M75122 Complete rotator cuff tear or rupture of left shoulder, not specified as traumatic: Secondary | ICD-10-CM | POA: Diagnosis not present

## 2019-10-05 DIAGNOSIS — M25512 Pain in left shoulder: Secondary | ICD-10-CM | POA: Diagnosis not present

## 2019-10-07 DIAGNOSIS — M25512 Pain in left shoulder: Secondary | ICD-10-CM | POA: Diagnosis not present

## 2019-10-07 DIAGNOSIS — M75122 Complete rotator cuff tear or rupture of left shoulder, not specified as traumatic: Secondary | ICD-10-CM | POA: Diagnosis not present

## 2019-10-10 DIAGNOSIS — M25512 Pain in left shoulder: Secondary | ICD-10-CM | POA: Diagnosis not present

## 2019-10-10 DIAGNOSIS — M75122 Complete rotator cuff tear or rupture of left shoulder, not specified as traumatic: Secondary | ICD-10-CM | POA: Diagnosis not present

## 2019-10-12 DIAGNOSIS — M25512 Pain in left shoulder: Secondary | ICD-10-CM | POA: Diagnosis not present

## 2019-10-12 DIAGNOSIS — M75122 Complete rotator cuff tear or rupture of left shoulder, not specified as traumatic: Secondary | ICD-10-CM | POA: Diagnosis not present

## 2019-10-13 DIAGNOSIS — M25512 Pain in left shoulder: Secondary | ICD-10-CM | POA: Diagnosis not present

## 2019-10-13 DIAGNOSIS — M75122 Complete rotator cuff tear or rupture of left shoulder, not specified as traumatic: Secondary | ICD-10-CM | POA: Diagnosis not present

## 2019-10-17 DIAGNOSIS — M75122 Complete rotator cuff tear or rupture of left shoulder, not specified as traumatic: Secondary | ICD-10-CM | POA: Diagnosis not present

## 2019-10-17 DIAGNOSIS — M25512 Pain in left shoulder: Secondary | ICD-10-CM | POA: Diagnosis not present

## 2019-10-19 DIAGNOSIS — M75122 Complete rotator cuff tear or rupture of left shoulder, not specified as traumatic: Secondary | ICD-10-CM | POA: Diagnosis not present

## 2019-10-19 DIAGNOSIS — M25512 Pain in left shoulder: Secondary | ICD-10-CM | POA: Diagnosis not present

## 2019-10-20 DIAGNOSIS — M25512 Pain in left shoulder: Secondary | ICD-10-CM | POA: Diagnosis not present

## 2019-10-20 DIAGNOSIS — M75122 Complete rotator cuff tear or rupture of left shoulder, not specified as traumatic: Secondary | ICD-10-CM | POA: Diagnosis not present

## 2019-10-24 ENCOUNTER — Other Ambulatory Visit: Payer: Self-pay | Admitting: Family Medicine

## 2019-10-24 DIAGNOSIS — M75122 Complete rotator cuff tear or rupture of left shoulder, not specified as traumatic: Secondary | ICD-10-CM | POA: Diagnosis not present

## 2019-10-24 DIAGNOSIS — M25512 Pain in left shoulder: Secondary | ICD-10-CM | POA: Diagnosis not present

## 2019-10-25 NOTE — Telephone Encounter (Signed)
Requested Prescriptions  Pending Prescriptions Disp Refills  . celecoxib (CELEBREX) 200 MG capsule [Pharmacy Med Name: CELECOXIB 200MG  CAPSULES] 90 capsule 1    Sig: TAKE 1 CAPSULE BY MOUTH EVERY DAY     Analgesics:  COX2 Inhibitors Failed - 10/24/2019  7:59 PM      Failed - HGB in normal range and within 360 days    Hemoglobin  Date Value Ref Range Status  08/29/2019 11.5 (L) 13.0 - 17.7 g/dL Final         Passed - Cr in normal range and within 360 days    Creatinine, Ser  Date Value Ref Range Status  08/29/2019 1.05 0.76 - 1.27 mg/dL Final         Passed - Patient is not pregnant      Passed - Valid encounter within last 12 months    Recent Outpatient Visits          1 month ago Essential hypertension   [facility] Jerrol Banana., MD   8 months ago Anemia, unspecified type   [facility] Jerrol Banana., MD   11 months ago Anemia, unspecified type   [facility] Jerrol Banana., MD   1 year ago Anemia, unspecified type   [facility] Jerrol Banana., MD   1 year ago Essential hypertension   [facility] Jerrol Banana., MD      Future Appointments            In 4 weeks Jerrol Banana., MD [facility], [place]

## 2019-10-27 DIAGNOSIS — M75122 Complete rotator cuff tear or rupture of left shoulder, not specified as traumatic: Secondary | ICD-10-CM | POA: Diagnosis not present

## 2019-10-27 DIAGNOSIS — M25512 Pain in left shoulder: Secondary | ICD-10-CM | POA: Diagnosis not present

## 2019-10-31 DIAGNOSIS — M25512 Pain in left shoulder: Secondary | ICD-10-CM | POA: Diagnosis not present

## 2019-10-31 DIAGNOSIS — M75122 Complete rotator cuff tear or rupture of left shoulder, not specified as traumatic: Secondary | ICD-10-CM | POA: Diagnosis not present

## 2019-11-03 DIAGNOSIS — M25512 Pain in left shoulder: Secondary | ICD-10-CM | POA: Diagnosis not present

## 2019-11-03 DIAGNOSIS — M75122 Complete rotator cuff tear or rupture of left shoulder, not specified as traumatic: Secondary | ICD-10-CM | POA: Diagnosis not present

## 2019-11-07 DIAGNOSIS — M25512 Pain in left shoulder: Secondary | ICD-10-CM | POA: Diagnosis not present

## 2019-11-07 DIAGNOSIS — M75122 Complete rotator cuff tear or rupture of left shoulder, not specified as traumatic: Secondary | ICD-10-CM | POA: Diagnosis not present

## 2019-11-10 DIAGNOSIS — M75122 Complete rotator cuff tear or rupture of left shoulder, not specified as traumatic: Secondary | ICD-10-CM | POA: Diagnosis not present

## 2019-11-10 DIAGNOSIS — M25512 Pain in left shoulder: Secondary | ICD-10-CM | POA: Diagnosis not present

## 2019-11-11 DIAGNOSIS — M25519 Pain in unspecified shoulder: Secondary | ICD-10-CM | POA: Diagnosis not present

## 2019-11-11 DIAGNOSIS — Z6820 Body mass index (BMI) 20.0-20.9, adult: Secondary | ICD-10-CM | POA: Diagnosis not present

## 2019-11-15 DIAGNOSIS — M25512 Pain in left shoulder: Secondary | ICD-10-CM | POA: Diagnosis not present

## 2019-11-15 DIAGNOSIS — M75122 Complete rotator cuff tear or rupture of left shoulder, not specified as traumatic: Secondary | ICD-10-CM | POA: Diagnosis not present

## 2019-11-17 DIAGNOSIS — M25512 Pain in left shoulder: Secondary | ICD-10-CM | POA: Diagnosis not present

## 2019-11-17 DIAGNOSIS — M75122 Complete rotator cuff tear or rupture of left shoulder, not specified as traumatic: Secondary | ICD-10-CM | POA: Diagnosis not present

## 2019-11-21 DIAGNOSIS — M25512 Pain in left shoulder: Secondary | ICD-10-CM | POA: Diagnosis not present

## 2019-11-21 DIAGNOSIS — M75122 Complete rotator cuff tear or rupture of left shoulder, not specified as traumatic: Secondary | ICD-10-CM | POA: Diagnosis not present

## 2019-11-22 NOTE — Progress Notes (Signed)
Patient: Daniel Owens Male    DOB: [birth year]   74 y.o.   MRN: [id number] Visit Date: 11/23/2019  Today's Provider: [name], MD   Chief Complaint  Patient presents with  . Hypertension   Subjective:     HPI   Essential hypertension From 08/29/2019-Controlled on losartan and metoprolol. Labs checked, Stable.   Chronic bilateral low back pain with bilateral sciatica From 08/29/2019-Refilled chronic narcotics.  I have reviewed PD MP  Other chronic pain From 08/29/2019-Chronic shoulder pain in addition to back pain. Refilled medication.    No Known Allergies   Current Outpatient Medications:  .  Ascorbic Acid (VITAMIN C) 1000 MG tablet, Take 1,000 mg by mouth daily., Disp: , Rfl:  .  B Complex Vitamins (VITAMIN B COMPLEX PO), Take 1 tablet by mouth daily., Disp: , Rfl:  .  beta carotene 25000 UNIT capsule, Take 25,000 Units by mouth daily., Disp: , Rfl:  .  celecoxib (CELEBREX) 100 MG capsule, Take 1 capsule (100 mg total) by mouth daily as needed., Disp: 30 capsule, Rfl: 5 .  celecoxib (CELEBREX) 200 MG capsule, TAKE 1 CAPSULE BY MOUTH EVERY DAY, Disp: 90 capsule, Rfl: 1 .  fish oil-omega-3 fatty acids 1000 MG capsule, Take 1 g by mouth daily., Disp: , Rfl:  .  fluticasone (FLONASE) 50 MCG/ACT nasal spray, Place 2 sprays into both nostrils daily., Disp: 16 g, Rfl: 6 .  folic acid (FOLVITE) A999333 MCG tablet, Take 1 tablet (400 mcg total) by mouth daily., Disp: 30 tablet, Rfl: 0 .  GLUCOSAMINE-CHONDROIT-VIT C-MN PO, Take by mouth daily. , Disp: , Rfl:  .  losartan (COZAAR) 25 MG tablet, TAKE 1 TABLET BY MOUTH DAILY, Disp: 90 tablet, Rfl: 3 .  metoprolol succinate (TOPROL-XL) 50 MG 24 hr tablet, TAKE 1 TABLET BY MOUTH ONCE DAILY, WITH OR IMMEDIATELY FOLLOWING A MEAL, Disp: 90 tablet, Rfl: 3 .  Multiple Vitamin (MULTIVITAMIN WITH MINERALS) TABS tablet, Take 1 tablet by mouth daily., Disp: , Rfl:  .  oxyCODONE (ROXICODONE) 15 MG immediate release tablet, Take 1  tablet (15 mg total) by mouth every 4 (four) hours as needed for pain., Disp: 120 tablet, Rfl: 0 .  oxyCODONE (ROXICODONE) 15 MG immediate release tablet, Take 1 tablet (15 mg total) by mouth every 6 (six) hours as needed for pain., Disp: 120 tablet, Rfl: 0 .  oxyCODONE (ROXICODONE) 15 MG immediate release tablet, Take 1 tablet (15 mg total) by mouth every 4 (four) hours as needed for pain., Disp: 120 tablet, Rfl: 0 .  rosuvastatin (CRESTOR) 5 MG tablet, TAKE 1 TABLET(5 MG) BY MOUTH DAILY, Disp: 90 tablet, Rfl: 3 .  Selenium 200 MCG TABS, Take 200 mcg by mouth daily., Disp: , Rfl:  .  tadalafil (CIALIS) 5 MG tablet, TAKE 1 TABLET BY MOUTH DAILY FOR BPH, Disp: 90 tablet, Rfl: 3 .  aspirin EC 81 MG tablet, Take 1 tablet (81 mg total) by mouth daily. (Patient not taking: Reported on 06/20/2019), Disp: 90 tablet, Rfl: 3 .  nicotine polacrilex (NICORETTE) 4 MG gum, Take 1 each (4 mg total) by mouth as needed for smoking cessation. (Patient not taking: Reported on 11/23/2019), Disp: 100 tablet, Rfl: 0  Review of Systems  Constitutional: Negative for appetite change, chills and fever.  HENT: Negative.   Eyes: Negative.   Respiratory: Negative for chest tightness, shortness of breath and wheezing.   Cardiovascular: Negative for chest pain and palpitations.  Gastrointestinal: Negative.  Negative  for abdominal pain, nausea and vomiting.  Endocrine: Negative.   Musculoskeletal: Positive for arthralgias and back pain.  Allergic/Immunologic: Negative.   Psychiatric/Behavioral: Negative.     Social History   Tobacco Use  . Smoking status: Former Smoker    Types: Cigarettes    Quit date: 10/19/1977    Years since quitting: 42.1  . Smokeless tobacco: Current User    Types: Chew  . Tobacco comment: trying to quit  Substance Use Topics  . Alcohol use: Yes    Alcohol/week: 0.0 standard drinks    Comment: very seldom- wine      Objective:   BP 113/69 (BP Location: Right Arm, Patient Position:  Sitting, Cuff Size: Large)   Pulse (!) 57   Temp (!) 97.5 F (36.4 C) (Temporal)   Ht 6\' 1"  (1.854 m)   Wt 164 lb (74.4 kg)   SpO2 97%   BMI 21.64 kg/m  Vitals:   11/23/19 1105  BP: 113/69  Pulse: (!) 57  Temp: (!) 97.5 F (36.4 C)  TempSrc: Temporal  SpO2: 97%  Weight: 164 lb (74.4 kg)  Height: 6\' 1"  (1.854 m)  Body mass index is 21.64 kg/m.   Physical Exam Vitals reviewed.  Constitutional:      Appearance: He is well-developed.  HENT:     Head: Normocephalic and atraumatic.     Right Ear: External ear normal.     Left Ear: External ear normal.     Nose: Nose normal.  Eyes:     General: No scleral icterus.    Conjunctiva/sclera: Conjunctivae normal.  Neck:     Thyroid: No thyromegaly.  Cardiovascular:     Rate and Rhythm: Normal rate and regular rhythm.     Heart sounds: Normal heart sounds.  Pulmonary:     Effort: Pulmonary effort is normal.     Breath sounds: Normal breath sounds.  Abdominal:     Palpations: Abdomen is soft.  Musculoskeletal:     Comments: Left 1+ edema. Right trace edema.  Skin:    General: Skin is warm and dry.  Neurological:     General: No focal deficit present.     Mental Status: He is alert and oriented to person, place, and time.  Psychiatric:        Mood and Affect: Mood normal.        Behavior: Behavior normal.        Thought Content: Thought content normal.        Judgment: Judgment normal.      No results found for any visits on 11/23/19.     Assessment & Plan    1. Traumatic complete tear of left rotator cuff, subsequent encounter   2. Chronic bilateral low back pain with bilateral sciatica Refill oxycocone times 3  3. Aortic atherosclerosis ([place])   4. Pure hypercholesterolemia      [name], MD  [facility]

## 2019-11-23 ENCOUNTER — Ambulatory Visit (INDEPENDENT_AMBULATORY_CARE_PROVIDER_SITE_OTHER): Payer: Medicare Other | Admitting: Family Medicine

## 2019-11-23 ENCOUNTER — Other Ambulatory Visit: Payer: Self-pay

## 2019-11-23 ENCOUNTER — Encounter: Payer: Self-pay | Admitting: Family Medicine

## 2019-11-23 VITALS — BP 113/69 | HR 57 | Temp 97.5°F | Ht 73.0 in | Wt 164.0 lb

## 2019-11-23 DIAGNOSIS — S46012D Strain of muscle(s) and tendon(s) of the rotator cuff of left shoulder, subsequent encounter: Secondary | ICD-10-CM

## 2019-11-23 DIAGNOSIS — M5442 Lumbago with sciatica, left side: Secondary | ICD-10-CM | POA: Diagnosis not present

## 2019-11-23 DIAGNOSIS — G8929 Other chronic pain: Secondary | ICD-10-CM | POA: Diagnosis not present

## 2019-11-23 DIAGNOSIS — I700 Atherosclerosis of aorta: Secondary | ICD-10-CM

## 2019-11-23 DIAGNOSIS — M5441 Lumbago with sciatica, right side: Secondary | ICD-10-CM | POA: Diagnosis not present

## 2019-11-23 DIAGNOSIS — E7800 Pure hypercholesterolemia, unspecified: Secondary | ICD-10-CM

## 2019-11-23 MED ORDER — OXYCODONE HCL 15 MG PO TABS: 15.0000 mg | ORAL_TABLET | Freq: Four times a day (QID) | ORAL | 0 refills | Status: DC | PRN

## 2019-11-23 MED ORDER — OXYCODONE HCL 15 MG PO TABS: 15.0000 mg | ORAL_TABLET | ORAL | 0 refills | Status: DC | PRN

## 2019-11-24 DIAGNOSIS — M75122 Complete rotator cuff tear or rupture of left shoulder, not specified as traumatic: Secondary | ICD-10-CM | POA: Diagnosis not present

## 2019-11-24 DIAGNOSIS — M25512 Pain in left shoulder: Secondary | ICD-10-CM | POA: Diagnosis not present

## 2019-11-28 DIAGNOSIS — M25512 Pain in left shoulder: Secondary | ICD-10-CM | POA: Diagnosis not present

## 2019-11-28 DIAGNOSIS — M75122 Complete rotator cuff tear or rupture of left shoulder, not specified as traumatic: Secondary | ICD-10-CM | POA: Diagnosis not present

## 2019-12-01 DIAGNOSIS — M25512 Pain in left shoulder: Secondary | ICD-10-CM | POA: Diagnosis not present

## 2019-12-01 DIAGNOSIS — M75122 Complete rotator cuff tear or rupture of left shoulder, not specified as traumatic: Secondary | ICD-10-CM | POA: Diagnosis not present

## 2019-12-05 DIAGNOSIS — M25512 Pain in left shoulder: Secondary | ICD-10-CM | POA: Diagnosis not present

## 2019-12-05 DIAGNOSIS — M75122 Complete rotator cuff tear or rupture of left shoulder, not specified as traumatic: Secondary | ICD-10-CM | POA: Diagnosis not present

## 2019-12-07 ENCOUNTER — Other Ambulatory Visit: Payer: Self-pay | Admitting: Family Medicine

## 2019-12-08 DIAGNOSIS — M25512 Pain in left shoulder: Secondary | ICD-10-CM | POA: Diagnosis not present

## 2019-12-08 DIAGNOSIS — M75122 Complete rotator cuff tear or rupture of left shoulder, not specified as traumatic: Secondary | ICD-10-CM | POA: Diagnosis not present

## 2019-12-12 DIAGNOSIS — M75122 Complete rotator cuff tear or rupture of left shoulder, not specified as traumatic: Secondary | ICD-10-CM | POA: Diagnosis not present

## 2019-12-12 DIAGNOSIS — M25512 Pain in left shoulder: Secondary | ICD-10-CM | POA: Diagnosis not present

## 2019-12-15 DIAGNOSIS — M75122 Complete rotator cuff tear or rupture of left shoulder, not specified as traumatic: Secondary | ICD-10-CM | POA: Diagnosis not present

## 2019-12-15 DIAGNOSIS — D649 Anemia, unspecified: Secondary | ICD-10-CM | POA: Diagnosis not present

## 2019-12-15 DIAGNOSIS — I7300 Raynaud's syndrome without gangrene: Secondary | ICD-10-CM | POA: Diagnosis not present

## 2019-12-15 DIAGNOSIS — Z862 Personal history of diseases of the blood and blood-forming organs and certain disorders involving the immune mechanism: Secondary | ICD-10-CM | POA: Diagnosis not present

## 2019-12-15 DIAGNOSIS — M25512 Pain in left shoulder: Secondary | ICD-10-CM | POA: Diagnosis not present

## 2019-12-19 DIAGNOSIS — M25512 Pain in left shoulder: Secondary | ICD-10-CM | POA: Diagnosis not present

## 2019-12-19 DIAGNOSIS — M75122 Complete rotator cuff tear or rupture of left shoulder, not specified as traumatic: Secondary | ICD-10-CM | POA: Diagnosis not present

## 2019-12-22 DIAGNOSIS — M25512 Pain in left shoulder: Secondary | ICD-10-CM | POA: Diagnosis not present

## 2019-12-22 DIAGNOSIS — M75122 Complete rotator cuff tear or rupture of left shoulder, not specified as traumatic: Secondary | ICD-10-CM | POA: Diagnosis not present

## 2019-12-26 DIAGNOSIS — M75122 Complete rotator cuff tear or rupture of left shoulder, not specified as traumatic: Secondary | ICD-10-CM | POA: Diagnosis not present

## 2019-12-26 DIAGNOSIS — M25512 Pain in left shoulder: Secondary | ICD-10-CM | POA: Diagnosis not present

## 2020-01-02 DIAGNOSIS — M25512 Pain in left shoulder: Secondary | ICD-10-CM | POA: Diagnosis not present

## 2020-01-02 DIAGNOSIS — M75122 Complete rotator cuff tear or rupture of left shoulder, not specified as traumatic: Secondary | ICD-10-CM | POA: Diagnosis not present

## 2020-01-05 DIAGNOSIS — M75122 Complete rotator cuff tear or rupture of left shoulder, not specified as traumatic: Secondary | ICD-10-CM | POA: Diagnosis not present

## 2020-01-05 DIAGNOSIS — M25512 Pain in left shoulder: Secondary | ICD-10-CM | POA: Diagnosis not present

## 2020-01-09 DIAGNOSIS — M75122 Complete rotator cuff tear or rupture of left shoulder, not specified as traumatic: Secondary | ICD-10-CM | POA: Diagnosis not present

## 2020-01-09 DIAGNOSIS — S46012D Strain of muscle(s) and tendon(s) of the rotator cuff of left shoulder, subsequent encounter: Secondary | ICD-10-CM | POA: Diagnosis not present

## 2020-01-09 DIAGNOSIS — M25512 Pain in left shoulder: Secondary | ICD-10-CM | POA: Diagnosis not present

## 2020-01-12 DIAGNOSIS — M75122 Complete rotator cuff tear or rupture of left shoulder, not specified as traumatic: Secondary | ICD-10-CM | POA: Diagnosis not present

## 2020-01-12 DIAGNOSIS — M25512 Pain in left shoulder: Secondary | ICD-10-CM | POA: Diagnosis not present

## 2020-01-16 DIAGNOSIS — M25512 Pain in left shoulder: Secondary | ICD-10-CM | POA: Diagnosis not present

## 2020-01-16 DIAGNOSIS — M75122 Complete rotator cuff tear or rupture of left shoulder, not specified as traumatic: Secondary | ICD-10-CM | POA: Diagnosis not present

## 2020-01-19 DIAGNOSIS — M75122 Complete rotator cuff tear or rupture of left shoulder, not specified as traumatic: Secondary | ICD-10-CM | POA: Diagnosis not present

## 2020-01-19 DIAGNOSIS — M25512 Pain in left shoulder: Secondary | ICD-10-CM | POA: Diagnosis not present

## 2020-01-23 DIAGNOSIS — M75122 Complete rotator cuff tear or rupture of left shoulder, not specified as traumatic: Secondary | ICD-10-CM | POA: Diagnosis not present

## 2020-01-23 DIAGNOSIS — M25512 Pain in left shoulder: Secondary | ICD-10-CM | POA: Diagnosis not present

## 2020-01-30 DIAGNOSIS — M25512 Pain in left shoulder: Secondary | ICD-10-CM | POA: Diagnosis not present

## 2020-01-30 DIAGNOSIS — M75122 Complete rotator cuff tear or rupture of left shoulder, not specified as traumatic: Secondary | ICD-10-CM | POA: Diagnosis not present

## 2020-02-02 DIAGNOSIS — M75122 Complete rotator cuff tear or rupture of left shoulder, not specified as traumatic: Secondary | ICD-10-CM | POA: Diagnosis not present

## 2020-02-02 DIAGNOSIS — M25512 Pain in left shoulder: Secondary | ICD-10-CM | POA: Diagnosis not present

## 2020-02-06 DIAGNOSIS — M75122 Complete rotator cuff tear or rupture of left shoulder, not specified as traumatic: Secondary | ICD-10-CM | POA: Diagnosis not present

## 2020-02-06 DIAGNOSIS — M25512 Pain in left shoulder: Secondary | ICD-10-CM | POA: Diagnosis not present

## 2020-02-08 DIAGNOSIS — M1711 Unilateral primary osteoarthritis, right knee: Secondary | ICD-10-CM | POA: Diagnosis not present

## 2020-02-08 DIAGNOSIS — M170 Bilateral primary osteoarthritis of knee: Secondary | ICD-10-CM | POA: Diagnosis not present

## 2020-02-08 DIAGNOSIS — M1712 Unilateral primary osteoarthritis, left knee: Secondary | ICD-10-CM | POA: Diagnosis not present

## 2020-02-08 DIAGNOSIS — Z6821 Body mass index (BMI) 21.0-21.9, adult: Secondary | ICD-10-CM | POA: Diagnosis not present

## 2020-02-09 DIAGNOSIS — M75122 Complete rotator cuff tear or rupture of left shoulder, not specified as traumatic: Secondary | ICD-10-CM | POA: Diagnosis not present

## 2020-02-09 DIAGNOSIS — M25512 Pain in left shoulder: Secondary | ICD-10-CM | POA: Diagnosis not present

## 2020-02-13 DIAGNOSIS — M25512 Pain in left shoulder: Secondary | ICD-10-CM | POA: Diagnosis not present

## 2020-02-13 DIAGNOSIS — M75122 Complete rotator cuff tear or rupture of left shoulder, not specified as traumatic: Secondary | ICD-10-CM | POA: Diagnosis not present

## 2020-03-06 ENCOUNTER — Other Ambulatory Visit: Payer: Self-pay | Admitting: Family Medicine

## 2020-03-06 NOTE — Telephone Encounter (Signed)
Requested Prescriptions  Pending Prescriptions Disp Refills  . losartan (COZAAR) 25 MG tablet [Pharmacy Med Name: LOSARTAN 25MG  TABLETS] 90 tablet 0    Sig: TAKE 1 TABLET BY MOUTH DAILY     Cardiovascular:  Angiotensin Receptor Blockers Failed - 03/06/2020 11:17 AM      Failed - Cr in normal range and within 180 days    Creatinine, Ser  Date Value Ref Range Status  08/29/2019 1.05 0.76 - 1.27 mg/dL Final         Failed - K in normal range and within 180 days    Potassium  Date Value Ref Range Status  08/29/2019 4.8 3.5 - 5.2 mmol/L Final         Passed - Patient is not pregnant      Passed - Last BP in normal range    BP Readings from Last 1 Encounters:  11/23/19 113/69         Passed - Valid encounter within last 6 months    Recent Outpatient Visits          3 months ago Traumatic complete tear of left rotator cuff, subsequent encounter   [facility] Jerrol Banana., MD   6 months ago Essential hypertension   [facility] Jerrol Banana., MD   1 year ago Anemia, unspecified type   [facility] Jerrol [facility]., MD   1 year ago Anemia, unspecified type   [facility] Jerrol Banana., MD   1 year ago Anemia, unspecified type   [facility] Jerrol Banana., MD      Future Appointments            In 1 week Jerrol Banana., MD [facility], [facility]           Courtesy refill

## 2020-03-07 DIAGNOSIS — M1711 Unilateral primary osteoarthritis, right knee: Secondary | ICD-10-CM | POA: Diagnosis not present

## 2020-03-07 DIAGNOSIS — M170 Bilateral primary osteoarthritis of knee: Secondary | ICD-10-CM | POA: Diagnosis not present

## 2020-03-07 DIAGNOSIS — M1712 Unilateral primary osteoarthritis, left knee: Secondary | ICD-10-CM | POA: Diagnosis not present

## 2020-03-07 DIAGNOSIS — M47816 Spondylosis without myelopathy or radiculopathy, lumbar region: Secondary | ICD-10-CM | POA: Diagnosis not present

## 2020-03-09 NOTE — Progress Notes (Signed)
Trena Platt Cummings,acting as a scribe for [name], MD.,have documented all relevant documentation on the behalf of [name], MD,as directed by  [name], MD while in the presence of [name], MD.  Established patient visit   Patient: Daniel Owens   DOB: 12/19/1945   74 y.o. Male  MRN: [id number] Visit Date: 03/15/2020  Today's healthcare provider: [name], MD   Chief Complaint  Patient presents with  . Hypertension   Subjective    HPI Patient's new complaint of pain is 1 of knee osteoarthritis right greater than left. Hypertension, follow-up  BP Readings from Last 3 Encounters:  03/15/20 126/66  11/23/19 113/69  08/29/19 126/64   Wt Readings from Last 3 Encounters:  03/15/20 157 lb (71.2 kg)  11/23/19 164 lb (74.4 kg)  08/29/19 159 lb (72.1 kg)     He was last seen for hypertension 6 months ago.  BP at that visit was 126/64. Management since that visit includes; controlled. He reports excellent compliance with treatment. He is not having side effects.  He is exercising. He is adherent to low salt diet.   Outside blood pressures is being checked.   He does not smoke.  Use of agents associated with hypertension: none.   ---------------------------------------------------------------------------------------------------  Chronic bilateral low back pain with bilateral sciatica From 08/29/2019-Refilled chronic narcotics.  I have reviewed PD MP.  BPH associated with nocturia From 08/29/2019-Check PSA and try Flomax.  Knee OA  Social History   Tobacco Use  . Smoking status: Former Smoker    Types: Cigarettes    Quit date: 10/19/1977    Years since quitting: 42.4  . Smokeless tobacco: Current User    Types: Chew  . Tobacco comment: trying to quit  Substance Use Topics  . Alcohol use: Yes    Alcohol/week: 0.0 standard drinks    Comment: very seldom- wine  . Drug use: No        Medications: Outpatient Medications Prior to Visit  Medication Sig  . Ascorbic Acid (VITAMIN C) 1000 MG tablet Take 1,000 mg by mouth daily.  . B Complex Vitamins (VITAMIN B COMPLEX PO) Take 1 tablet by mouth daily.  . beta carotene 25000 UNIT capsule Take 25,000 Units by mouth daily.  . celecoxib (CELEBREX) 100 MG capsule TAKE 1 CAPSULE(100 MG) BY MOUTH DAILY AS NEEDED  . celecoxib (CELEBREX) 200 MG capsule TAKE 1 CAPSULE BY MOUTH EVERY DAY  . fish oil-omega-3 fatty acids 1000 MG capsule Take 1 g by mouth daily.  . folic acid (FOLVITE) A999333 MCG tablet Take 1 tablet (400 mcg total) by mouth daily.  Marland Kitchen GLUCOSAMINE-CHONDROIT-VIT C-MN PO Take by mouth daily.   Marland Kitchen losartan (COZAAR) 25 MG tablet TAKE 1 TABLET BY MOUTH DAILY  . metoprolol succinate (TOPROL-XL) 50 MG 24 hr tablet TAKE 1 TABLET BY MOUTH ONCE DAILY, WITH OR IMMEDIATELY FOLLOWING A MEAL  . Multiple Vitamin (MULTIVITAMIN WITH MINERALS) TABS tablet Take 1 tablet by mouth daily.  Marland Kitchen oxyCODONE (ROXICODONE) 15 MG immediate release tablet Take 1 tablet (15 mg total) by mouth every 4 (four) hours as needed for pain.  Marland Kitchen oxyCODONE (ROXICODONE) 15 MG immediate release tablet Take 1 tablet (15 mg total) by mouth every 6 (six) hours as needed for pain.  Marland Kitchen oxyCODONE (ROXICODONE) 15 MG immediate release tablet Take 1 tablet (15 mg total) by mouth every 4 (four) hours as needed for pain.  . rosuvastatin (CRESTOR) 5 MG tablet TAKE 1  TABLET(5 MG) BY MOUTH DAILY  . Selenium 200 MCG TABS Take 200 mcg by mouth daily.  . tadalafil (CIALIS) 5 MG tablet TAKE 1 TABLET BY MOUTH DAILY FOR BPH  . aspirin EC 81 MG tablet Take 1 tablet (81 mg total) by mouth daily. (Patient not taking: Reported on 06/20/2019)  . fluticasone (FLONASE) 50 MCG/ACT nasal spray Place 2 sprays into both nostrils daily. (Patient not taking: Reported on 03/15/2020)  . nicotine polacrilex (NICORETTE) 4 MG gum Take 1 each (4 mg total) by mouth as needed for smoking cessation. (Patient not  taking: Reported on 11/23/2019)   No facility-administered medications prior to visit.    Review of Systems  Constitutional: Negative for appetite change, chills and fever.  HENT: Negative.   Eyes: Negative.   Respiratory: Negative for chest tightness, shortness of breath and wheezing.   Cardiovascular: Negative for chest pain and palpitations.  Gastrointestinal: Negative for abdominal pain, nausea and vomiting.  Endocrine: Negative.   Allergic/Immunologic: Negative.   Neurological: Negative.   Hematological: Negative.   Psychiatric/Behavioral: Negative.        Objective    BP 126/66 (BP Location: Left Arm, Patient Position: Sitting, Cuff Size: Normal)   Pulse 64   Temp (!) 97.5 F (36.4 C) (Temporal)   Ht 6\' 1"  (1.854 m)   Wt 157 lb (71.2 kg)   BMI 20.71 kg/m     Physical Exam Vitals reviewed.  Constitutional:      Appearance: Normal appearance.  HENT:     Head: Normocephalic and atraumatic.     Right Ear: External ear normal.     Left Ear: External ear normal.  Eyes:     General: No scleral icterus.    Conjunctiva/sclera: Conjunctivae normal.  Cardiovascular:     Rate and Rhythm: Normal rate and regular rhythm.     Pulses: Normal pulses.     Heart sounds: Normal heart sounds.  Pulmonary:     Effort: Pulmonary effort is normal.     Breath sounds: Normal breath sounds.  Musculoskeletal:     Right lower leg: No edema.     Left lower leg: No edema.  Skin:    General: Skin is warm and dry.  Neurological:     General: No focal deficit present.     Mental Status: He is alert and oriented to person, place, and time.  Psychiatric:        Mood and Affect: Mood normal.        Behavior: Behavior normal.        Thought Content: Thought content normal.        Judgment: Judgment normal.       No results found for any visits on 03/15/20.  Assessment & Plan     .1. Essential hypertension Good control on losartan and metoprolol.  2. Other chronic pain PDMP  review done. - oxyCODONE (ROXICODONE) 15 MG immediate release tablet; Take 1 tablet (15 mg total) by mouth every 4 (four) hours as needed for pain.  Dispense: 120 tablet; Refill: 0 - oxyCODONE (ROXICODONE) 15 MG immediate release tablet; Take 1 tablet (15 mg total) by mouth every 6 (six) hours as needed for pain.  Dispense: 120 tablet; Refill: 0 - oxyCODONE (ROXICODONE) 15 MG immediate release tablet; Take 1 tablet (15 mg total) by mouth every 4 (four) hours as needed for pain.  Dispense: 120 tablet; Refill: 0  3. Arthritis Of bilateral knees now.  Also chronic shoulder pain per patient.  4. Chronic  bilateral low back pain with bilateral sciatica  5.  Hyperlipidemia On Crestor for ASCVD   No follow-ups on file.      I, [name], MD, have reviewed all documentation for this visit. The documentation on 03/18/20 for the exam, diagnosis, procedures, and orders are all accurate and complete.    Carl Bleecker Cranford Mon, MD  [facility] [phone] (phone) [phone] (fax)  [place]

## 2020-03-12 DIAGNOSIS — D649 Anemia, unspecified: Secondary | ICD-10-CM | POA: Diagnosis not present

## 2020-03-12 DIAGNOSIS — D7589 Other specified diseases of blood and blood-forming organs: Secondary | ICD-10-CM | POA: Diagnosis not present

## 2020-03-12 DIAGNOSIS — Z862 Personal history of diseases of the blood and blood-forming organs and certain disorders involving the immune mechanism: Secondary | ICD-10-CM | POA: Diagnosis not present

## 2020-03-15 ENCOUNTER — Other Ambulatory Visit: Payer: Self-pay

## 2020-03-15 ENCOUNTER — Ambulatory Visit (INDEPENDENT_AMBULATORY_CARE_PROVIDER_SITE_OTHER): Payer: Medicare Other | Admitting: Family Medicine

## 2020-03-15 ENCOUNTER — Encounter: Payer: Self-pay | Admitting: Family Medicine

## 2020-03-15 VITALS — BP 126/66 | HR 64 | Temp 97.5°F | Ht 73.0 in | Wt 157.0 lb

## 2020-03-15 DIAGNOSIS — G8929 Other chronic pain: Secondary | ICD-10-CM

## 2020-03-15 DIAGNOSIS — I10 Essential (primary) hypertension: Secondary | ICD-10-CM

## 2020-03-15 DIAGNOSIS — M1990 Unspecified osteoarthritis, unspecified site: Secondary | ICD-10-CM | POA: Diagnosis not present

## 2020-03-15 DIAGNOSIS — E7800 Pure hypercholesterolemia, unspecified: Secondary | ICD-10-CM

## 2020-03-15 DIAGNOSIS — M5442 Lumbago with sciatica, left side: Secondary | ICD-10-CM | POA: Diagnosis not present

## 2020-03-15 DIAGNOSIS — I700 Atherosclerosis of aorta: Secondary | ICD-10-CM

## 2020-03-15 DIAGNOSIS — M5441 Lumbago with sciatica, right side: Secondary | ICD-10-CM

## 2020-03-15 MED ORDER — OXYCODONE HCL 15 MG PO TABS: 15.0000 mg | ORAL_TABLET | ORAL | 0 refills | Status: DC | PRN

## 2020-03-15 MED ORDER — OXYCODONE HCL 15 MG PO TABS: 15.0000 mg | ORAL_TABLET | Freq: Four times a day (QID) | ORAL | 0 refills | Status: DC | PRN

## 2020-03-17 ENCOUNTER — Other Ambulatory Visit: Payer: Self-pay | Admitting: Family Medicine

## 2020-03-17 DIAGNOSIS — I700 Atherosclerosis of aorta: Secondary | ICD-10-CM

## 2020-03-17 NOTE — Telephone Encounter (Signed)
Requested Prescriptions  Pending Prescriptions Disp Refills  . rosuvastatin (CRESTOR) 5 MG tablet [Pharmacy Med Name: ROSUVASTATIN 5MG  TABLETS] 90 tablet 3    Sig: TAKE 1 TABLET(5 MG) BY MOUTH DAILY     Cardiovascular:  Antilipid - Statins Failed - 03/17/2020 11:22 AM      Failed - LDL in normal range and within 360 days    LDL Chol Calc (NIH)  Date Value Ref Range Status  08/29/2019 61 0 - 99 mg/dL Final         Passed - Total Cholesterol in normal range and within 360 days    Cholesterol, Total  Date Value Ref Range Status  08/29/2019 164 100 - 199 mg/dL Final         Passed - HDL in normal range and within 360 days    HDL  Date Value Ref Range Status  08/29/2019 79 >39 mg/dL Final         Passed - Triglycerides in normal range and within 360 days    Triglycerides  Date Value Ref Range Status  08/29/2019 143 0 - 149 mg/dL Final         Passed - Patient is not pregnant      Passed - Valid encounter within last 12 months    Recent Outpatient Visits          2 days ago Essential hypertension   [facility] Jerrol Banana., MD   3 months ago Traumatic complete tear of left rotator cuff, subsequent encounter   [facility] Jerrol Banana., MD   6 months ago Essential hypertension   [facility] Jerrol Banana., MD   1 year ago Anemia, unspecified type   [facility] Jerrol Banana., MD   1 year ago Anemia, unspecified type   [facility] Jerrol Banana., MD      Future Appointments            In 3 months Jerrol Banana., MD [facility] [place]

## 2020-03-22 ENCOUNTER — Ambulatory Visit: Payer: Self-pay | Admitting: Family Medicine

## 2020-03-22 DIAGNOSIS — M25561 Pain in right knee: Secondary | ICD-10-CM | POA: Diagnosis not present

## 2020-03-22 DIAGNOSIS — M1711 Unilateral primary osteoarthritis, right knee: Secondary | ICD-10-CM | POA: Insufficient documentation

## 2020-03-22 DIAGNOSIS — M8580 Other specified disorders of bone density and structure, unspecified site: Secondary | ICD-10-CM | POA: Diagnosis not present

## 2020-03-30 DIAGNOSIS — M419 Scoliosis, unspecified: Secondary | ICD-10-CM | POA: Insufficient documentation

## 2020-04-02 DIAGNOSIS — M5117 Intervertebral disc disorders with radiculopathy, lumbosacral region: Secondary | ICD-10-CM | POA: Diagnosis not present

## 2020-04-02 DIAGNOSIS — M5416 Radiculopathy, lumbar region: Secondary | ICD-10-CM | POA: Diagnosis not present

## 2020-05-04 DIAGNOSIS — H2510 Age-related nuclear cataract, unspecified eye: Secondary | ICD-10-CM | POA: Diagnosis not present

## 2020-05-04 DIAGNOSIS — Z8669 Personal history of other diseases of the nervous system and sense organs: Secondary | ICD-10-CM | POA: Diagnosis not present

## 2020-05-11 ENCOUNTER — Other Ambulatory Visit: Payer: Self-pay | Admitting: Neurological Surgery

## 2020-05-11 DIAGNOSIS — M4726 Other spondylosis with radiculopathy, lumbar region: Secondary | ICD-10-CM | POA: Diagnosis not present

## 2020-05-16 DIAGNOSIS — M1711 Unilateral primary osteoarthritis, right knee: Secondary | ICD-10-CM | POA: Diagnosis not present

## 2020-05-29 ENCOUNTER — Ambulatory Visit
Admission: RE | Admit: 2020-05-29 | Discharge: 2020-05-29 | Disposition: A | Discharge status: Home / Self Care | Payer: Medicare Other | Source: Ambulatory Visit | Attending: Neurological Surgery | Admitting: Neurological Surgery

## 2020-05-29 ENCOUNTER — Other Ambulatory Visit: Payer: Self-pay

## 2020-05-29 DIAGNOSIS — M4726 Other spondylosis with radiculopathy, lumbar region: Secondary | ICD-10-CM | POA: Diagnosis not present

## 2020-05-29 DIAGNOSIS — M48061 Spinal stenosis, lumbar region without neurogenic claudication: Secondary | ICD-10-CM | POA: Diagnosis not present

## 2020-05-29 DIAGNOSIS — M5126 Other intervertebral disc displacement, lumbar region: Secondary | ICD-10-CM | POA: Diagnosis not present

## 2020-05-29 DIAGNOSIS — M4807 Spinal stenosis, lumbosacral region: Secondary | ICD-10-CM | POA: Diagnosis not present

## 2020-05-29 DIAGNOSIS — M47816 Spondylosis without myelopathy or radiculopathy, lumbar region: Secondary | ICD-10-CM | POA: Diagnosis not present

## 2020-06-01 DIAGNOSIS — M48062 Spinal stenosis, lumbar region with neurogenic claudication: Secondary | ICD-10-CM | POA: Diagnosis not present

## 2020-06-02 ENCOUNTER — Other Ambulatory Visit: Payer: Self-pay | Admitting: Family Medicine

## 2020-06-02 NOTE — Telephone Encounter (Signed)
Requested Prescriptions  Pending Prescriptions Disp Refills   losartan (COZAAR) 25 MG tablet [Pharmacy Med Name: LOSARTAN 25MG  TABLETS] 90 tablet 1    Sig: TAKE 1 TABLET BY MOUTH DAILY     Cardiovascular:  Angiotensin Receptor Blockers Failed - 06/02/2020  3:17 PM      Failed - Cr in normal range and within 180 days    Creatinine, Ser  Date Value Ref Range Status  08/29/2019 1.05 0.76 - 1.27 mg/dL Final         Failed - K in normal range and within 180 days    Potassium  Date Value Ref Range Status  08/29/2019 4.8 3.5 - 5.2 mmol/L Final         Passed - Patient is not pregnant      Passed - Last BP in normal range    BP Readings from Last 1 Encounters:  03/15/20 126/66         Passed - Valid encounter within last 6 months    Recent Outpatient Visits          2 months ago Essential hypertension   [facility] Jerrol Banana., MD   6 months ago Traumatic complete tear of left rotator cuff, subsequent encounter   [facility] Jerrol Banana., MD   9 months ago Essential hypertension   [facility] Jerrol Banana., MD   1 year ago Anemia, unspecified type   [facility] Jerrol Banana., MD   1 year ago Anemia, unspecified type   [facility] Jerrol Banana., MD      Future Appointments            In 2 weeks Jerrol Banana., MD [facility], [place]

## 2020-06-14 NOTE — Progress Notes (Signed)
Trena Platt Cummings,acting as a scribe for [name], MD.,have documented all relevant documentation on the behalf of [name], MD,as directed by  [name], MD while in the presence of [name], MD.  Established patient visit   Patient: Daniel Owens   DOB: 11/13/1945   74 y.o. Male  MRN: [id number] Visit Date: 06/18/2020  Today's healthcare provider: [name], MD   Chief Complaint  Patient presents with  . Hyperlipidemia  . Hypertension   Subjective    HPI  Patient complains of discoloration and leg pain of left leg. Would like to be referred to Dr. Leotis Pain in [facility].  He does not like the appearance of his legs with varicose veins and skin changes due to chronic venous stasis. Patient also having numbness in both feet, says he is being seen for back pain and gets back injections.  I would like to have treatment for his neuropathy.  He has tingling and discomfort in his feet.  He states they feel "cold". He has no with walking. Medication refill.   Would also like covid antibodies checked.   Hypertension, follow-up  BP Readings from Last 3 Encounters:  06/18/20 (!) 135/58  03/15/20 126/66  11/23/19 113/69   Wt Readings from Last 3 Encounters:  06/18/20 154 lb 6.4 oz (70 kg)  03/15/20 157 lb (71.2 kg)  11/23/19 164 lb (74.4 kg)     He was last seen for hypertension 3 months ago.  BP at that visit was 126/66. Management since that visit includes; Good control on losartan and metoprolol. He reports excellent compliance with treatment. He is not having side effects.  He is exercising. He is adherent to low salt diet.   Outside blood pressures are being checked.  He does not smoke.  Use of agents associated with hypertension: none.   ---------------------------------------------------------------------------------------------------  Other chronic pain From 03/15/2020-PDMP review done. Refilled oxyCODONE  (ROXICODONE) 15 mg.  Social History   Tobacco Use  . Smoking status: Former Smoker    Types: Cigarettes    Quit date: 10/19/1977    Years since quitting: 42.6  . Smokeless tobacco: Current User    Types: Chew  . Tobacco comment: trying to quit  Vaping Use  . Vaping Use: Never used  Substance Use Topics  . Alcohol use: Yes    Alcohol/week: 0.0 standard drinks    Comment: very seldom- wine  . Drug use: No       Medications: Outpatient Medications Prior to Visit  Medication Sig  . Ascorbic Acid (VITAMIN C) 1000 MG tablet Take 1,000 mg by mouth daily.  . B Complex Vitamins (VITAMIN B COMPLEX PO) Take 1 tablet by mouth daily.  . beta carotene 25000 UNIT capsule Take 25,000 Units by mouth daily.  . celecoxib (CELEBREX) 200 MG capsule TAKE 1 CAPSULE BY MOUTH EVERY DAY  . fish oil-omega-3 fatty acids 1000 MG capsule Take 1 g by mouth daily.  . fluticasone (FLONASE) 50 MCG/ACT nasal spray Place 2 sprays into both nostrils daily.  . folic acid (FOLVITE) 353 MCG tablet Take 1 tablet (400 mcg total) by mouth daily.  Marland Kitchen GLUCOSAMINE-CHONDROIT-VIT C-MN PO Take by mouth daily.   Marland Kitchen losartan (COZAAR) 25 MG tablet TAKE 1 TABLET BY MOUTH DAILY  . metoprolol succinate (TOPROL-XL) 50 MG 24 hr tablet TAKE 1 TABLET BY MOUTH ONCE DAILY, WITH OR IMMEDIATELY FOLLOWING A MEAL  . Multiple Vitamin (MULTIVITAMIN WITH MINERALS) TABS tablet  Take 1 tablet by mouth daily.  Marland Kitchen oxyCODONE (ROXICODONE) 15 MG immediate release tablet Take 1 tablet (15 mg total) by mouth every 4 (four) hours as needed for pain.  Marland Kitchen oxyCODONE (ROXICODONE) 15 MG immediate release tablet Take 1 tablet (15 mg total) by mouth every 6 (six) hours as needed for pain.  Marland Kitchen oxyCODONE (ROXICODONE) 15 MG immediate release tablet Take 1 tablet (15 mg total) by mouth every 4 (four) hours as needed for pain.  . rosuvastatin (CRESTOR) 5 MG tablet TAKE 1 TABLET(5 MG) BY MOUTH DAILY  . Selenium 200 MCG TABS Take 200 mcg by mouth daily.  . tadalafil  (CIALIS) 5 MG tablet TAKE 1 TABLET BY MOUTH DAILY FOR BPH  . Zinc 50 MG CAPS Take by mouth.  Marland Kitchen aspirin EC 81 MG tablet Take 1 tablet (81 mg total) by mouth daily. (Patient not taking: Reported on 06/20/2019)  . celecoxib (CELEBREX) 100 MG capsule TAKE 1 CAPSULE(100 MG) BY MOUTH DAILY AS NEEDED (Patient not taking: Reported on 06/18/2020)  . nicotine polacrilex (NICORETTE) 4 MG gum Take 1 each (4 mg total) by mouth as needed for smoking cessation. (Patient not taking: Reported on 11/23/2019)   No facility-administered medications prior to visit.    Review of Systems  Constitutional: Negative for appetite change, chills and fever.  Respiratory: Negative for chest tightness, shortness of breath and wheezing.   Cardiovascular: Negative for chest pain and palpitations.  Gastrointestinal: Negative for abdominal pain, nausea and vomiting.       Objective    BP (!) 135/58 (BP Location: Right Arm, Patient Position: Sitting, Cuff Size: Normal)   Pulse (!) 57   Temp 98.8 F (37.1 C) (Oral)   Ht 6\' 1"  (1.854 m)   Wt 154 lb 6.4 oz (70 kg)   BMI 20.37 kg/m  BP Readings from Last 3 Encounters:  06/18/20 (!) 135/58  03/15/20 126/66  11/23/19 113/69   Wt Readings from Last 3 Encounters:  06/18/20 154 lb 6.4 oz (70 kg)  03/15/20 157 lb (71.2 kg)  11/23/19 164 lb (74.4 kg)      Physical Exam Vitals reviewed.  Constitutional:      Appearance: Normal appearance.  HENT:     Head: Normocephalic and atraumatic.     Right Ear: External ear normal.     Left Ear: External ear normal.  Eyes:     General: No scleral icterus.    Conjunctiva/sclera: Conjunctivae normal.  Cardiovascular:     Rate and Rhythm: Normal rate and regular rhythm.     Pulses: Normal pulses.     Heart sounds: Normal heart sounds.     Comments: Pulses good. Large varicose veins. Chronic skin changes due to venous stasis. Pulmonary:     Effort: Pulmonary effort is normal.     Breath sounds: Normal breath sounds.    Musculoskeletal:     Right lower leg: No edema.     Left lower leg: No edema.  Skin:    General: Skin is warm and dry.     Capillary Refill: Capillary refill takes 2 to 3 seconds.  Neurological:     General: No focal deficit present.     Mental Status: He is alert and oriented to person, place, and time.  Psychiatric:        Mood and Affect: Mood normal.        Behavior: Behavior normal.        Thought Content: Thought content normal.  Judgment: Judgment normal.       No results found for any visits on 06/18/20.  Assessment & Plan     1. Essential hypertension Controlled on metoprolol and losartan - TSH - Lipid panel - Comprehensive Metabolic Panel (CMET) - CBC w/Diff/Platelet - Vitamin B12 - Hemoglobin A1C  2. Other chronic pain Chronic back pain.  Would not increase the dose of his oxycodone. - oxyCODONE (ROXICODONE) 15 MG immediate release tablet; Take 1 tablet (15 mg total) by mouth every 4 (four) hours as needed for pain.  Dispense: 120 tablet; Refill: 0 - oxyCODONE (ROXICODONE) 15 MG immediate release tablet; Take 1 tablet (15 mg total) by mouth every 6 (six) hours as needed for pain.  Dispense: 120 tablet; Refill: 0 - oxyCODONE (ROXICODONE) 15 MG immediate release tablet; Take 1 tablet (15 mg total) by mouth every 4 (four) hours as needed for pain.  Dispense: 120 tablet; Refill: 0 - TSH - Lipid panel - Comprehensive Metabolic Panel (CMET) - CBC w/Diff/Platelet - Vitamin B12 - Hemoglobin A1C  3. Arthritis  - TSH - Lipid panel - Comprehensive Metabolic Panel (CMET) - CBC w/Diff/Platelet - Vitamin B12 - Hemoglobin A1C  4. Chronic bilateral low back pain with bilateral sciatica  - TSH - Lipid panel - Comprehensive Metabolic Panel (CMET) - CBC w/Diff/Platelet - Vitamin B12 - Hemoglobin A1C  5. Pure hypercholesterolemia On atorvastatin - TSH - Lipid panel - Comprehensive Metabolic Panel (CMET) - CBC w/Diff/Platelet - Vitamin B12 -  Hemoglobin A1C  6. Encounter for preprocedure screening laboratory testing for COVID-19  - TSH - Lipid panel - Comprehensive Metabolic Panel (CMET) - CBC w/Diff/Platelet - Vitamin B12 - Hemoglobin A1C - SAR CoV2 Serology (COVID 19)AB(IGG)IA  7. Tingling Chronic neuropathy - TSH - Lipid panel - Comprehensive Metabolic Panel (CMET) - CBC w/Diff/Platelet - Vitamin B12 - Hemoglobin A1C  8. Neuropathy Start alpha lipoic acid daily. - TSH - Lipid panel - Comprehensive Metabolic Panel (CMET) - CBC w/Diff/Platelet - Vitamin B12 - Hemoglobin A1C  9. History of elevated glucose  - Hemoglobin A1C  10. Bilateral cold feet Patient wishes referral to vascular.  Pulses and  capillary refill.  Good today. - Ambulatory referral to Vascular Surgery   No follow-ups on file.         Arren Laminack Cranford Mon, MD  [facility] [phone] (phone) [phone] (fax)  [place]

## 2020-06-18 ENCOUNTER — Other Ambulatory Visit: Payer: Self-pay

## 2020-06-18 ENCOUNTER — Ambulatory Visit (INDEPENDENT_AMBULATORY_CARE_PROVIDER_SITE_OTHER): Payer: Medicare Other | Admitting: Family Medicine

## 2020-06-18 ENCOUNTER — Encounter: Payer: Self-pay | Admitting: Family Medicine

## 2020-06-18 VITALS — BP 135/58 | HR 57 | Temp 98.8°F | Ht 73.0 in | Wt 154.4 lb

## 2020-06-18 DIAGNOSIS — G629 Polyneuropathy, unspecified: Secondary | ICD-10-CM

## 2020-06-18 DIAGNOSIS — M1990 Unspecified osteoarthritis, unspecified site: Secondary | ICD-10-CM

## 2020-06-18 DIAGNOSIS — R209 Unspecified disturbances of skin sensation: Secondary | ICD-10-CM | POA: Diagnosis not present

## 2020-06-18 DIAGNOSIS — Z8639 Personal history of other endocrine, nutritional and metabolic disease: Secondary | ICD-10-CM

## 2020-06-18 DIAGNOSIS — M5442 Lumbago with sciatica, left side: Secondary | ICD-10-CM | POA: Diagnosis not present

## 2020-06-18 DIAGNOSIS — Z01812 Encounter for preprocedural laboratory examination: Secondary | ICD-10-CM

## 2020-06-18 DIAGNOSIS — G8929 Other chronic pain: Secondary | ICD-10-CM

## 2020-06-18 DIAGNOSIS — I10 Essential (primary) hypertension: Secondary | ICD-10-CM

## 2020-06-18 DIAGNOSIS — M5441 Lumbago with sciatica, right side: Secondary | ICD-10-CM | POA: Diagnosis not present

## 2020-06-18 DIAGNOSIS — Z20822 Contact with and (suspected) exposure to covid-19: Secondary | ICD-10-CM

## 2020-06-18 DIAGNOSIS — E7800 Pure hypercholesterolemia, unspecified: Secondary | ICD-10-CM | POA: Diagnosis not present

## 2020-06-18 DIAGNOSIS — R202 Paresthesia of skin: Secondary | ICD-10-CM | POA: Diagnosis not present

## 2020-06-18 MED ORDER — OXYCODONE HCL 15 MG PO TABS: 15.0000 mg | ORAL_TABLET | ORAL | 0 refills | Status: DC | PRN

## 2020-06-18 MED ORDER — OXYCODONE HCL 15 MG PO TABS: 15.0000 mg | ORAL_TABLET | Freq: Four times a day (QID) | ORAL | 0 refills | Status: DC | PRN

## 2020-06-18 NOTE — Patient Instructions (Signed)
Take over-the-counter Alpha Lipoic Acid daily.

## 2020-06-19 DIAGNOSIS — G8929 Other chronic pain: Secondary | ICD-10-CM | POA: Diagnosis not present

## 2020-06-19 DIAGNOSIS — E7800 Pure hypercholesterolemia, unspecified: Secondary | ICD-10-CM | POA: Diagnosis not present

## 2020-06-19 DIAGNOSIS — Z8639 Personal history of other endocrine, nutritional and metabolic disease: Secondary | ICD-10-CM | POA: Diagnosis not present

## 2020-06-19 DIAGNOSIS — M1990 Unspecified osteoarthritis, unspecified site: Secondary | ICD-10-CM | POA: Diagnosis not present

## 2020-06-19 DIAGNOSIS — R202 Paresthesia of skin: Secondary | ICD-10-CM | POA: Diagnosis not present

## 2020-06-19 DIAGNOSIS — G629 Polyneuropathy, unspecified: Secondary | ICD-10-CM | POA: Diagnosis not present

## 2020-06-19 DIAGNOSIS — Z01812 Encounter for preprocedural laboratory examination: Secondary | ICD-10-CM | POA: Diagnosis not present

## 2020-06-19 DIAGNOSIS — Z20822 Contact with and (suspected) exposure to covid-19: Secondary | ICD-10-CM | POA: Diagnosis not present

## 2020-06-19 DIAGNOSIS — M5442 Lumbago with sciatica, left side: Secondary | ICD-10-CM | POA: Diagnosis not present

## 2020-06-19 DIAGNOSIS — M5441 Lumbago with sciatica, right side: Secondary | ICD-10-CM | POA: Diagnosis not present

## 2020-06-19 DIAGNOSIS — I10 Essential (primary) hypertension: Secondary | ICD-10-CM | POA: Diagnosis not present

## 2020-06-20 LAB — SAR COV2 SEROLOGY (COVID19)AB(IGG),IA: DiaSorin SARS-CoV-2 Ab, IgG: POSITIVE

## 2020-06-20 LAB — LIPID PANEL
Chol/HDL Ratio: 2 ratio (ref 0.0–5.0)
Cholesterol, Total: 167 mg/dL (ref 100–199)
HDL: 84 mg/dL (ref >39)
LDL Chol Calc (NIH): 67 mg/dL (ref 0–99)
Triglycerides: 87 mg/dL (ref 0–149)
VLDL Cholesterol Cal: 16 mg/dL (ref 5–40)

## 2020-06-20 LAB — COMPREHENSIVE METABOLIC PANEL
ALT: 14 IU/L (ref 0–44)
AST: 19 IU/L (ref 0–40)
Albumin/Globulin Ratio: 2.2 (ref 1.2–2.2)
Albumin: 4.3 g/dL (ref 3.7–4.7)
Alkaline Phosphatase: 53 IU/L (ref 48–121)
BUN/Creatinine Ratio: 18 (ref 10–24)
BUN: 19 mg/dL (ref 8–27)
Bilirubin Total: 0.6 mg/dL (ref 0.0–1.2)
CO2: 26 mmol/L (ref 20–29)
Calcium: 9.3 mg/dL (ref 8.6–10.2)
Chloride: 99 mmol/L (ref 96–106)
Creatinine, Ser: 1.05 mg/dL (ref 0.76–1.27)
GFR calc Af Amer: 82 mL/min/{1.73_m2} (ref >59)
GFR calc non Af Amer: 71 mL/min/{1.73_m2} (ref >59)
Globulin, Total: 2 g/dL (ref 1.5–4.5)
Glucose: 90 mg/dL (ref 65–99)
Potassium: 4.2 mmol/L (ref 3.5–5.2)
Sodium: 137 mmol/L (ref 134–144)
Total Protein: 6.3 g/dL (ref 6.0–8.5)

## 2020-06-20 LAB — CBC WITH DIFFERENTIAL/PLATELET
Basophils Absolute: 0 10*3/uL (ref 0.0–0.2)
Basos: 1 %
EOS (ABSOLUTE): 0.1 10*3/uL (ref 0.0–0.4)
Eos: 1 %
Hematocrit: 35.4 % — ABNORMAL LOW (ref 37.5–51.0)
Hemoglobin: 11.9 g/dL — ABNORMAL LOW (ref 13.0–17.7)
Immature Grans (Abs): 0 10*3/uL (ref 0.0–0.1)
Immature Granulocytes: 0 %
Lymphocytes Absolute: 1.1 10*3/uL (ref 0.7–3.1)
Lymphs: 23 %
MCH: 35.3 pg — ABNORMAL HIGH (ref 26.6–33.0)
MCHC: 33.6 g/dL (ref 31.5–35.7)
MCV: 105 fL — ABNORMAL HIGH (ref 79–97)
Monocytes Absolute: 0.6 10*3/uL (ref 0.1–0.9)
Monocytes: 13 %
Neutrophils Absolute: 3 10*3/uL (ref 1.4–7.0)
Neutrophils: 62 %
Platelets: 175 10*3/uL (ref 150–450)
RBC: 3.37 x10E6/uL — ABNORMAL LOW (ref 4.14–5.80)
RDW: 11.8 % (ref 11.6–15.4)
WBC: 4.8 10*3/uL (ref 3.4–10.8)

## 2020-06-20 LAB — HEMOGLOBIN A1C
Est. average glucose Bld gHb Est-mCnc: 111 mg/dL
Hgb A1c MFr Bld: 5.5 % (ref 4.8–5.6)

## 2020-06-20 LAB — TSH: TSH: 1.57 u[IU]/mL (ref 0.450–4.500)

## 2020-06-20 LAB — VITAMIN B12: Vitamin B-12: 654 pg/mL (ref 232–1245)

## 2020-06-27 NOTE — Progress Notes (Signed)
Subjective:   Daniel Owens is a 74 y.o. male who presents for Medicare Annual/Subsequent preventive examination.  I connected with Estell Harpin today by telephone and verified that I am speaking with the correct person using two identifiers. Location patient: home Location provider: work Persons participating in the virtual visit: patient, provider.   I discussed the limitations, risks, security and privacy concerns of performing an evaluation and management service by telephone and the availability of in person appointments. I also discussed with the patient that there may be a patient responsible charge related to this service. The patient expressed understanding and verbally consented to this telephonic visit.    Interactive audio and video telecommunications were attempted between this provider and patient, however failed, due to patient having technical difficulties OR patient did not have access to video capability.  We continued and completed visit with audio only.   Review of Systems    N/A  Cardiac Risk Factors include: advanced age (>13men, >19 women);male gender;hypertension;dyslipidemia     Objective:    Today's Vitals   06/28/20 1422  PainSc: 3    There is no height or weight on file to calculate BMI.  Advanced Directives 06/28/2020 06/20/2019 06/13/2019 10/21/2018 09/29/2018 06/14/2018 06/12/2017  Does Patient Have a Medical Advance Directive? Yes Yes Yes Yes Yes Yes Yes  Type of Paramedic of [place];Living will Living will;Healthcare Power of [place];Living will - - [place];Living will [place];Living will  Does patient want to make changes to medical advance directive? - - No - Patient declined - - - -  Copy of [place] in Chart? No - copy requested No - copy requested No - copy requested - - No - copy requested No - copy requested    Current Medications  (verified) Outpatient Encounter Medications as of 06/28/2020  Medication Sig  . Ascorbic Acid (VITAMIN C) 1000 MG tablet Take 1,000 mg by mouth daily.  . B Complex Vitamins (VITAMIN B COMPLEX PO) Take 1 tablet by mouth daily.  . beta carotene 25000 UNIT capsule Take 25,000 Units by mouth daily.  . celecoxib (CELEBREX) 200 MG capsule TAKE 1 CAPSULE BY MOUTH EVERY DAY  . fish oil-omega-3 fatty acids 1000 MG capsule Take 1 g by mouth daily.  . fluticasone (FLONASE) 50 MCG/ACT nasal spray Place 2 sprays into both nostrils daily.  . folic acid (FOLVITE) 951 MCG tablet Take 1 tablet (400 mcg total) by mouth daily.  Marland Kitchen GLUCOSAMINE-CHONDROIT-VIT C-MN PO Take by mouth daily.   Marland Kitchen losartan (COZAAR) 25 MG tablet TAKE 1 TABLET BY MOUTH DAILY  . metoprolol succinate (TOPROL-XL) 50 MG 24 hr tablet TAKE 1 TABLET BY MOUTH ONCE DAILY, WITH OR IMMEDIATELY FOLLOWING A MEAL  . Multiple Vitamin (MULTIVITAMIN WITH MINERALS) TABS tablet Take 1 tablet by mouth daily.  Marland Kitchen oxyCODONE (ROXICODONE) 15 MG immediate release tablet Take 1 tablet (15 mg total) by mouth every 4 (four) hours as needed for pain.  . rosuvastatin (CRESTOR) 5 MG tablet TAKE 1 TABLET(5 MG) BY MOUTH DAILY  . Selenium 200 MCG TABS Take 200 mcg by mouth daily.  . Zinc 50 MG CAPS Take by mouth.  Marland Kitchen aspirin EC 81 MG tablet Take 1 tablet (81 mg total) by mouth daily. (Patient not taking: Reported on 06/20/2019)  . celecoxib (CELEBREX) 100 MG capsule TAKE 1 CAPSULE(100 MG) BY MOUTH DAILY AS NEEDED (Patient not taking: Reported on 06/18/2020)  . nicotine  polacrilex (NICORETTE) 4 MG gum Take 1 each (4 mg total) by mouth as needed for smoking cessation. (Patient not taking: Reported on 11/23/2019)  . oxyCODONE (ROXICODONE) 15 MG immediate release tablet Take 1 tablet (15 mg total) by mouth every 6 (six) hours as needed for pain. (Patient not taking: Reported on 06/28/2020)  . oxyCODONE (ROXICODONE) 15 MG immediate release tablet Take 1 tablet (15 mg total) by mouth every  4 (four) hours as needed for pain. (Patient not taking: Reported on 06/28/2020)  . tadalafil (CIALIS) 5 MG tablet TAKE 1 TABLET BY MOUTH DAILY FOR BPH (Patient not taking: Reported on 06/28/2020)   No facility-administered encounter medications on file as of 06/28/2020.    Allergies (verified) Patient has no known allergies.   History: Past Medical History:  Diagnosis Date  . Abnormal stress test    a. 07/2017 ETT: Ex 10:15, no chest pain, HTN response, 32mm ST dep in II, II, aVF, V4-V6 - returned to baseline in 5 mins-->Intermediate risk study.  . Aortic atherosclerosis ([place])    a. Incidentally noted on Chest CT in 2016.  . Carotid arterial disease ([place])    a. 07/2014 carotid u/s: mild <50%, bilat ICA stenosis.  . Hyperlipidemia   . Hypertension   . Macrocytic anemia   . osteoarthritis   . Palpitations    a. ? prior dx of PAF.  Marland Kitchen Raynauds syndrome   . Systolic murmur    a. Reports h/o rheumatic fever as a child;  b. 07/2017 Echo: EF 55-60%, no rwma, triv AI, nl RV fxn.   Past Surgical History:  Procedure Laterality Date  . APPENDECTOMY    . BILATERAL KNEE ARTHROSCOPY    . HERNIA REPAIR    . NECK SURGERY     involving disc  . NOSE SURGERY    . RETINAL DETACHMENT SURGERY    . SHOULDER SURGERY     Right-x 3 overall  . TENDON REPAIR     involving Tricep tendon   Family History  Problem Relation Age of Onset  . Stroke Mother   . Uterine cancer Mother   . Throat cancer Father   . Allergic rhinitis Brother   . Prostate cancer Maternal Uncle   . Prostate cancer Paternal Uncle   . Heart attack Maternal Grandmother   . Heart attack Maternal Grandfather    Social History   Socioeconomic History  . Marital status: Married    Spouse name: Not on file  . Number of children: 2  . Years of education: Not on file  . Highest education level: Associate degree: academic program  Occupational History  . Occupation: retired  Tobacco Use  . Smoking status: Former Smoker    Types:  Cigarettes    Quit date: 10/19/1977    Years since quitting: 42.7  . Smokeless tobacco: Current User    Types: Chew  . Tobacco comment: trying to quit  Vaping Use  . Vaping Use: Never used  Substance and Sexual Activity  . Alcohol use: Yes    Alcohol/week: 0.0 standard drinks    Comment: very seldom- wine  . Drug use: No  . Sexual activity: Not on file  Other Topics Concern  . Not on file  Social History Narrative  . Not on file   Social Determinants of Health   Financial Resource Strain: Low Risk   . Difficulty of Paying Living Expenses: Not hard at all  Food Insecurity: No Food Insecurity  . Worried About [employer] of  Food in the Last Year: Never true  . Ran Out of Food in the Last Year: Never true  Transportation Needs: No Transportation Needs  . Lack of Transportation (Medical): No  . Lack of Transportation (Non-Medical): No  Physical Activity: Inactive  . Days of Exercise per Week: 0 days  . Minutes of Exercise per Session: 0 min  Stress: No Stress Concern Present  . Feeling of Stress : Not at all  Social Connections: Socially Integrated  . Frequency of Communication with Friends and Family: More than three times a week  . Frequency of Social Gatherings with Friends and Family: More than three times a week  . Attends Religious Services: More than 4 times per year  . Active Member of Clubs or Organizations: Yes  . Attends Archivist Meetings: Never  . Marital Status: Married    Tobacco Counseling Ready to quit: Not Answered Counseling given: Not Answered Comment: trying to quit   Clinical Intake:  Pre-visit preparation completed: Yes  Pain : 0-10 Pain Score: 3  Pain Type: Chronic pain Pain Location: Back (and knees) Pain Orientation: Lower Pain Descriptors / Indicators: Aching Pain Frequency: Constant Pain Relieving Factors: Pt takes Celebrex and Oxycodone for pain.  Pain Relieving Factors: Pt takes Celebrex and Oxycodone for  pain.  Nutritional Risks: None Diabetes: No  How often do you need to have someone help you when you read instructions, pamphlets, or other written materials from your doctor or pharmacy?: 1 - Never  Diabetic? No  Interpreter Needed?: No  Information entered by :: [facility], LPN   Activities of Daily Living In your present state of health, do you have any difficulty performing the following activities: 06/28/2020  Hearing? N  Vision? N  Difficulty concentrating or making decisions? N  Walking or climbing stairs? Y  Comment Due to knee pain.  Dressing or bathing? N  Doing errands, shopping? N  Preparing Food and eating ? N  Using the Toilet? N  In the past six months, have you accidently leaked urine? N  Do you have problems with loss of bowel control? N  Managing your Medications? N  Managing your Finances? N  Housekeeping or managing your Housekeeping? N  Some recent data might be hidden    Patient Care Team: Jerrol Banana., MD as PCP - General (Family Medicine) Anell Barr, OD as Consulting Physician (Optometry) Kristeen Miss, MD as Consulting Physician (Neurosurgery) Marry Guan, Laurice Record, MD (Orthopedic Surgery) O'Branski, Cleophus Molt, [place] (Oncology) Lucky Cowboy Erskine Squibb, MD as Referring Physician (Vascular Surgery) Mardi Mainland, MD as Referring Physician (Orthopedic Surgery)  Indicate any recent Medical Services you may have received from other than [facility] providers in the past year (date may be approximate).     Assessment:   This is a routine wellness examination for Daniel Owens.  Hearing/Vision screen No exam data present  Dietary issues and exercise activities discussed: Current Exercise Habits: The patient does not participate in regular exercise at present, Exercise limited by: orthopedic condition(s)  Goals    . Increase water intake     Recommend increasing water intake to 4-6 glasses a day.       Depression Screen PHQ 2/9 Scores 06/28/2020  06/20/2019 06/14/2018 06/12/2017 06/10/2016 06/05/2015  PHQ - 2 Score 0 0 0 0 0 0    Fall Risk Fall Risk  06/28/2020 09/14/2019 06/20/2019 06/14/2018 06/12/2017  Falls in the past year? 0 0 0 No Yes  Comment - Emmi Telephone Survey: data to  providers prior to load - - -  Number falls in past yr: 0 - - - 1  Injury with Fall? 0 - - - Yes  Comment - - - - 2 broken vertebreas   Follow up Falls prevention discussed - - - Falls prevention discussed    Any stairs in or around the home? No  If so, are there any without handrails? No  Home free of loose throw rugs in walkways, pet beds, electrical cords, etc? Yes  Adequate lighting in your home to reduce risk of falls? Yes   ASSISTIVE DEVICES UTILIZED TO PREVENT FALLS:  Life alert? No  Use of a cane, walker or w/c? No  Grab bars in the bathroom? Yes  Shower chair or bench in shower? Yes  Elevated toilet seat or a handicapped toilet? Yes    Cognitive Function:     6CIT Screen 06/28/2020 06/20/2019 06/14/2018 06/12/2017  What Year? 0 points 0 points 0 points 0 points  What month? 0 points 0 points 0 points 0 points  What time? 0 points 0 points 0 points 0 points  Count back from 20 0 points 0 points 0 points 0 points  Months in reverse 0 points 0 points 0 points 0 points  Repeat phrase 0 points 0 points 0 points 2 points  Total Score 0 0 0 2    Immunizations Immunization History  Administered Date(s) Administered  . Influenza, High Dose Seasonal PF 07/30/2015, 07/30/2017, 07/27/2018, 07/01/2019  . PFIZER SARS-COV-2 Vaccination 05/20/2020  . Pneumococcal Conjugate-13 08/08/2014  . Pneumococcal Polysaccharide-23 11/15/2012  . Td 06/12/2016  . Zoster 04/09/2010    TDAP status: Up to date Flu Vaccine status: Due fall 2021 Pneumococcal vaccine status: Completed during today's visit. Covid-19 vaccine status: Declined, Education has been provided regarding the importance of this vaccine but patient still declined. Advised may receive this  vaccine at local pharmacy or Health Dept.or vaccine clinic. Aware to provide a copy of the vaccination record if obtained from local pharmacy or Health Dept. Verbalized acceptance and understanding.  Qualifies for Shingles Vaccine? Yes   Zostavax completed Yes   Shingrix Completed?: No.    Education has been provided regarding the importance of this vaccine. Patient has been advised to call insurance company to determine out of pocket expense if they have not yet received this vaccine. Advised may also receive vaccine at local pharmacy or Health Dept. Verbalized acceptance and understanding.  Screening Tests Health Maintenance  Topic Date Due  . INFLUENZA VACCINE  05/20/2020  . COVID-19 Vaccine (2 - Pfizer 2-dose series) 06/10/2020  . COLONOSCOPY  12/25/2022  . TETANUS/TDAP  06/12/2026  . Hepatitis C Screening  Completed  . PNA vac Low Risk Adult  Completed    Health Maintenance  Health Maintenance Due  Topic Date Due  . INFLUENZA VACCINE  05/20/2020  . COVID-19 Vaccine (2 - Pfizer 2-dose series) 06/10/2020    Colorectal cancer screening: Completed 12/24/12. Repeat every 10 years  Lung Cancer Screening: (Low Dose CT Chest recommended if Age 28-80 years, 30 pack-year currently smoking OR have quit w/in 15years.) does not qualify.    Additional Screening:  Hepatitis C Screening: Up to date  Vision Screening: Recommended annual ophthalmology exams for early detection of glaucoma and other disorders of the eye. Is the patient up to date with their annual eye exam?  Yes  Who is the provider or what is the name of the office in which the patient attends annual eye exams? Dr Ellin Mayhew  If pt is not established with a provider, would they like to be referred to a provider to establish care? No .   Dental Screening: Recommended annual dental exams for proper oral hygiene  Community Resource Referral / Chronic Care Management: CRR required this visit?  No   CCM required this visit?  No       Plan:     I have personally reviewed and noted the following in the patient's chart:   . Medical and social history . Use of alcohol, tobacco or illicit drugs  . Current medications and supplements . Functional ability and status . Nutritional status . Physical activity . Advanced directives . List of other physicians . Hospitalizations, surgeries, and ER visits in previous 12 months . Vitals . Screenings to include cognitive, depression, and falls . Referrals and appointments  In addition, I have reviewed and discussed with patient certain preventive protocols, quality metrics, and best practice recommendations. A written personalized care plan for preventive services as well as general preventive health recommendations were provided to patient.     Darrielle Pflieger [place], [place]   10/28/4172   Nurse Notes: Pt needs a flu shot at next in office apt. Pt is waiting to receive his second Covid shot at this time.

## 2020-06-28 ENCOUNTER — Ambulatory Visit (INDEPENDENT_AMBULATORY_CARE_PROVIDER_SITE_OTHER): Payer: Medicare Other

## 2020-06-28 ENCOUNTER — Other Ambulatory Visit: Payer: Self-pay

## 2020-06-28 DIAGNOSIS — Z0000 Encounter for general adult medical examination without abnormal findings: Secondary | ICD-10-CM

## 2020-06-28 NOTE — Patient Instructions (Signed)
Daniel Owens , Thank you for taking time to come for your Medicare Wellness Visit. I appreciate your ongoing commitment to your health goals. Please review the following plan we discussed and let me know if I can assist you in the future.   Screening recommendations/referrals: Colonoscopy: Up to date, due 12/2022 Recommended yearly ophthalmology/optometry visit for glaucoma screening and checkup Recommended yearly dental visit for hygiene and checkup  Vaccinations: Influenza vaccine: Due fall 2021 Pneumococcal vaccine: Completed series Tdap vaccine: Up to date, due 05/2026 Shingles vaccine: Shingrix discussed. Please contact your pharmacy for coverage information.     Advanced directives: Please bring a copy of your POA (Power of Attorney) and/or Living Will to your next appointment.   Conditions/risks identified: Continue to increase water intake to 6-8 8 oz glasses a day.   Next appointment: 09/18/20 @ 11:00 AM with Dr [name]   Preventive Care 65 Years and Older, Male Preventive care refers to lifestyle choices and visits with your health care provider that can promote health and wellness. What does preventive care include?  A yearly physical exam. This is also called an annual well check.  Dental exams once or twice a year.  Routine eye exams. Ask your health care provider how often you should have your eyes checked.  Personal lifestyle choices, including:  Daily care of your teeth and gums.  Regular physical activity.  Eating a healthy diet.  Avoiding tobacco and drug use.  Limiting alcohol use.  Practicing safe sex.  Taking low doses of aspirin every day.  Taking vitamin and mineral supplements as recommended by your health care provider. What happens during an annual well check? The services and screenings done by your health care provider during your annual well check will depend on your age, overall health, lifestyle risk factors, and family history of  disease. Counseling  Your health care provider may ask you questions about your:  Alcohol use.  Tobacco use.  Drug use.  Emotional well-being.  Home and relationship well-being.  Sexual activity.  Eating habits.  History of falls.  Memory and ability to understand (cognition).  Work and work Statistician. Screening  You may have the following tests or measurements:  Height, weight, and BMI.  Blood pressure.  Lipid and cholesterol levels. These may be checked every 5 years, or more frequently if you are over 24 years old.  Skin check.  Lung cancer screening. You may have this screening every year starting at age 45 if you have a 30-pack-year history of smoking and currently smoke or have quit within the past 15 years.  Fecal occult blood test (FOBT) of the stool. You may have this test every year starting at age 69.  Flexible sigmoidoscopy or colonoscopy. You may have a sigmoidoscopy every 5 years or a colonoscopy every 10 years starting at age 45.  Prostate cancer screening. Recommendations will vary depending on your family history and other risks.  Hepatitis C blood test.  Hepatitis B blood test.  Sexually transmitted disease (STD) testing.  Diabetes screening. This is done by checking your blood sugar (glucose) after you have not eaten for a while (fasting). You may have this done every 1-3 years.  Abdominal aortic aneurysm (AAA) screening. You may need this if you are a current or former smoker.  Osteoporosis. You may be screened starting at age 50 if you are at high risk. Talk with your health care provider about your test results, treatment options, and if necessary, the need for more tests.  Vaccines  Your health care provider may recommend certain vaccines, such as:  Influenza vaccine. This is recommended every year.  Tetanus, diphtheria, and acellular pertussis (Tdap, Td) vaccine. You may need a Td booster every 10 years.  Zoster vaccine. You may  need this after age 55.  Pneumococcal 13-valent conjugate (PCV13) vaccine. One dose is recommended after age 64.  Pneumococcal polysaccharide (PPSV23) vaccine. One dose is recommended after age 43. Talk to your health care provider about which screenings and vaccines you need and how often you need them. This information is not intended to replace advice given to you by your health care provider. Make sure you discuss any questions you have with your health care provider. Document Released: 11/02/2015 Document Revised: 06/25/2016 Document Reviewed: 08/07/2015 Elsevier Interactive Patient Education  2017 [place] Prevention in the Home Falls can cause injuries. They can happen to people of all ages. There are many things you can do to make your home safe and to help prevent falls. What can I do on the outside of my home?  Regularly fix the edges of walkways and driveways and fix any cracks.  Remove anything that might make you trip as you walk through a door, such as a raised step or threshold.  Trim any bushes or trees on the path to your home.  Use bright outdoor lighting.  Clear any walking paths of anything that might make someone trip, such as rocks or tools.  Regularly check to see if handrails are loose or broken. Make sure that both sides of any steps have handrails.  Any raised decks and porches should have guardrails on the edges.  Have any leaves, snow, or ice cleared regularly.  Use sand or salt on walking paths during winter.  Clean up any spills in your garage right away. This includes oil or grease spills. What can I do in the bathroom?  Use night lights.  Install grab bars by the toilet and in the tub and shower. Do not use towel bars as grab bars.  Use non-skid mats or decals in the tub or shower.  If you need to sit down in the shower, use a plastic, non-slip stool.  Keep the floor dry. Clean up any water that spills on the floor as soon as it  happens.  Remove soap buildup in the tub or shower regularly.  Attach bath mats securely with double-sided non-slip rug tape.  Do not have throw rugs and other things on the floor that can make you trip. What can I do in the bedroom?  Use night lights.  Make sure that you have a light by your bed that is easy to reach.  Do not use any sheets or blankets that are too big for your bed. They should not hang down onto the floor.  Have a firm chair that has side arms. You can use this for support while you get dressed.  Do not have throw rugs and other things on the floor that can make you trip. What can I do in the kitchen?  Clean up any spills right away.  Avoid walking on wet floors.  Keep items that you use a lot in easy-to-reach places.  If you need to reach something above you, use a strong step stool that has a grab bar.  Keep electrical cords out of the way.  Do not use floor polish or wax that makes floors slippery. If you must use wax, use non-skid floor wax.  Do not have throw rugs and other things on the floor that can make you trip. What can I do with my stairs?  Do not leave any items on the stairs.  Make sure that there are handrails on both sides of the stairs and use them. Fix handrails that are broken or loose. Make sure that handrails are as long as the stairways.  Check any carpeting to make sure that it is firmly attached to the stairs. Fix any carpet that is loose or worn.  Avoid having throw rugs at the top or bottom of the stairs. If you do have throw rugs, attach them to the floor with carpet tape.  Make sure that you have a light switch at the top of the stairs and the bottom of the stairs. If you do not have them, ask someone to add them for you. What else can I do to help prevent falls?  Wear shoes that:  Do not have high heels.  Have rubber bottoms.  Are comfortable and fit you well.  Are closed at the toe. Do not wear sandals.  If you  use a stepladder:  Make sure that it is fully opened. Do not climb a closed stepladder.  Make sure that both sides of the stepladder are locked into place.  Ask someone to hold it for you, if possible.  Clearly mark and make sure that you can see:  Any grab bars or handrails.  First and last steps.  Where the edge of each step is.  Use tools that help you move around (mobility aids) if they are needed. These include:  Canes.  Walkers.  Scooters.  Crutches.  Turn on the lights when you go into a dark area. Replace any light bulbs as soon as they burn out.  Set up your furniture so you have a clear path. Avoid moving your furniture around.  If any of your floors are uneven, fix them.  If there are any pets around you, be aware of where they are.  Review your medicines with your doctor. Some medicines can make you feel dizzy. This can increase your chance of falling. Ask your doctor what other things that you can do to help prevent falls. This information is not intended to replace advice given to you by your health care provider. Make sure you discuss any questions you have with your health care provider. Document Released: 08/02/2009 Document Revised: 03/13/2016 Document Reviewed: 11/10/2014 Elsevier Interactive Patient Education  2017 [employer].

## 2020-07-17 ENCOUNTER — Encounter (INDEPENDENT_AMBULATORY_CARE_PROVIDER_SITE_OTHER): Payer: Self-pay | Admitting: Vascular Surgery

## 2020-07-19 DIAGNOSIS — G5793 Unspecified mononeuropathy of bilateral lower limbs: Secondary | ICD-10-CM | POA: Diagnosis not present

## 2020-07-25 DIAGNOSIS — Z23 Encounter for immunization: Secondary | ICD-10-CM | POA: Diagnosis not present

## 2020-08-03 ENCOUNTER — Encounter (INDEPENDENT_AMBULATORY_CARE_PROVIDER_SITE_OTHER): Payer: Self-pay | Admitting: Vascular Surgery

## 2020-08-03 ENCOUNTER — Ambulatory Visit (INDEPENDENT_AMBULATORY_CARE_PROVIDER_SITE_OTHER): Payer: Medicare Other | Admitting: Vascular Surgery

## 2020-08-03 ENCOUNTER — Other Ambulatory Visit: Payer: Self-pay

## 2020-08-03 VITALS — BP 157/64 | HR 60 | Ht 72.0 in | Wt 155.0 lb

## 2020-08-03 DIAGNOSIS — E7800 Pure hypercholesterolemia, unspecified: Secondary | ICD-10-CM | POA: Diagnosis not present

## 2020-08-03 DIAGNOSIS — M5442 Lumbago with sciatica, left side: Secondary | ICD-10-CM

## 2020-08-03 DIAGNOSIS — M5441 Lumbago with sciatica, right side: Secondary | ICD-10-CM

## 2020-08-03 DIAGNOSIS — I83819 Varicose veins of unspecified lower extremities with pain: Secondary | ICD-10-CM | POA: Diagnosis not present

## 2020-08-03 DIAGNOSIS — G8929 Other chronic pain: Secondary | ICD-10-CM

## 2020-08-03 DIAGNOSIS — I10 Essential (primary) hypertension: Secondary | ICD-10-CM

## 2020-08-03 DIAGNOSIS — R231 Pallor: Secondary | ICD-10-CM | POA: Insufficient documentation

## 2020-08-03 NOTE — Assessment & Plan Note (Signed)
Some of his symptoms are likely neuropathic in nature although his venous insufficiency may be contributing as well.

## 2020-08-03 NOTE — Assessment & Plan Note (Signed)
lipid control important in reducing the progression of atherosclerotic disease. Continue statin therapy  

## 2020-08-03 NOTE — Progress Notes (Signed)
Patient ID: Daniel Owens, male   DOB: 15-Feb-1946, 74 y.o.   MRN: [id number]  Chief Complaint  Patient presents with  . New Patient (Initial Visit)    BLE cold feet     HPI Daniel Owens is a 74 y.o. male.  Patient presents with pain, discoloration, and prominent varicosities particularly in the left lower extremity.  He reports having had what sounds like a laser ablation done by physician no longer in the medical community about 10 to 12 years ago.  He has had back issues and has neuropathy as well.  A few weeks ago, he had severe left leg pain on the outside of the leg.  This has quelled over time but there is still some discomfort in the lower leg.  He has very prominent varicosities and marked stasis dermatitis changes in that left leg.  His right leg varicosities are prominent but the stasis changes are not as severe.  The pain is a burning and stinging type pain.  He has some mild swelling although this is not prominent.  He has not worn his compression stockings in some time.  He does try to elevate his legs.   Past Medical History:  Diagnosis Date  . Abnormal stress test    a. 07/2017 ETT: Ex 10:15, no chest pain, HTN response, 9m ST dep in II, II, aVF, V4-V6 - returned to baseline in 5 mins-->Intermediate risk study.  . Aortic atherosclerosis ([place]    a. Incidentally noted on Chest CT in 2016.  . Carotid arterial disease ([place]    a. 07/2014 carotid u/s: mild <50%, bilat ICA stenosis.  . Hyperlipidemia   . Hypertension   . Macrocytic anemia   . osteoarthritis   . Palpitations    a. ? prior dx of PAF.  .Marland KitchenRaynauds syndrome   . Systolic murmur    a. Reports h/o rheumatic fever as a child;  b. 07/2017 Echo: EF 55-60%, no rwma, triv AI, nl RV fxn.    Past Surgical History:  Procedure Laterality Date  . APPENDECTOMY    . BILATERAL KNEE ARTHROSCOPY    . HERNIA REPAIR    . NECK SURGERY     involving disc  . NOSE SURGERY    . RETINAL DETACHMENT SURGERY    . SHOULDER SURGERY      Right-x 3 overall  . TENDON REPAIR     involving Tricep tendon     Family History  Problem Relation Age of Onset  . Stroke Mother   . Uterine cancer Mother   . Throat cancer Father   . Allergic rhinitis Brother   . Prostate cancer Maternal Uncle   . Prostate cancer Paternal Uncle   . Heart attack Maternal Grandmother   . Heart attack Maternal Grandfather       Social History   Tobacco Use  . Smoking status: Former Smoker    Types: Cigarettes    Quit date: 10/19/1977    Years since quitting: 42.8  . Smokeless tobacco: Current User    Types: Chew  . Tobacco comment: trying to quit  Vaping Use  . Vaping Use: Never used  Substance Use Topics  . Alcohol use: Yes    Alcohol/week: 0.0 standard drinks    Comment: very seldom- wine  . Drug use: No     No Known Allergies  Current Outpatient Medications  Medication Sig Dispense Refill  . Ascorbic Acid (VITAMIN C) 1000 MG tablet Take 1,000 mg by  mouth daily.    Marland Kitchen aspirin EC 81 MG tablet Take 1 tablet (81 mg total) by mouth daily. 90 tablet 3  . B Complex Vitamins (VITAMIN B COMPLEX PO) Take 1 tablet by mouth daily.    . beta carotene 25000 UNIT capsule Take 25,000 Units by mouth daily.    . celecoxib (CELEBREX) 100 MG capsule TAKE 1 CAPSULE(100 MG) BY MOUTH DAILY AS NEEDED 30 capsule 5  . celecoxib (CELEBREX) 200 MG capsule TAKE 1 CAPSULE BY MOUTH EVERY DAY 90 capsule 1  . fish oil-omega-3 fatty acids 1000 MG capsule Take 1 g by mouth daily.    . fluticasone (FLONASE) 50 MCG/ACT nasal spray Place 2 sprays into both nostrils daily. 16 g 6  . folic acid (FOLVITE) 269 MCG tablet Take 1 tablet (400 mcg total) by mouth daily. 30 tablet 0  . GLUCOSAMINE-CHONDROIT-VIT C-MN PO Take by mouth daily.     Marland Kitchen losartan (COZAAR) 25 MG tablet TAKE 1 TABLET BY MOUTH DAILY 90 tablet 1  . metoprolol succinate (TOPROL-XL) 50 MG 24 hr tablet TAKE 1 TABLET BY MOUTH ONCE DAILY, WITH OR IMMEDIATELY FOLLOWING A MEAL 90 tablet 3  . Multiple  Vitamin (MULTIVITAMIN WITH MINERALS) TABS tablet Take 1 tablet by mouth daily.    . nicotine polacrilex (NICORETTE) 4 MG gum Take 1 each (4 mg total) by mouth as needed for smoking cessation. 100 tablet 0  . oxyCODONE (ROXICODONE) 15 MG immediate release tablet Take 1 tablet (15 mg total) by mouth every 4 (four) hours as needed for pain. 120 tablet 0  . oxyCODONE (ROXICODONE) 15 MG immediate release tablet Take 1 tablet (15 mg total) by mouth every 6 (six) hours as needed for pain. 120 tablet 0  . oxyCODONE (ROXICODONE) 15 MG immediate release tablet Take 1 tablet (15 mg total) by mouth every 4 (four) hours as needed for pain. 120 tablet 0  . rosuvastatin (CRESTOR) 5 MG tablet TAKE 1 TABLET(5 MG) BY MOUTH DAILY 90 tablet 3  . Selenium 200 MCG TABS Take 200 mcg by mouth daily.    . tadalafil (CIALIS) 5 MG tablet TAKE 1 TABLET BY MOUTH DAILY FOR BPH 90 tablet 3  . Zinc 50 MG CAPS Take by mouth.    . pregabalin (LYRICA) 75 MG capsule      No current facility-administered medications for this visit.      REVIEW OF SYSTEMS (Negative unless checked)  Constitutional: '[]' Weight loss  '[]' Fever  '[]' Chills Cardiac: '[]' Chest pain   '[]' Chest pressure   '[]' Palpitations   '[]' Shortness of breath when laying flat   '[]' Shortness of breath at rest   '[]' Shortness of breath with exertion. Vascular:  '[]' Pain in legs with walking   '[]' Pain in legs at rest   '[]' Pain in legs when laying flat   '[]' Claudication   '[]' Pain in feet when walking  '[]' Pain in feet at rest  '[]' Pain in feet when laying flat   '[]' History of DVT   '[]' Phlebitis   '[x]' Swelling in legs   '[x]' Varicose veins   '[]' Non-healing ulcers Pulmonary:   '[]' Uses home oxygen   '[]' Productive cough   '[]' Hemoptysis   '[]' Wheeze  '[]' COPD   '[]' Asthma Neurologic:  '[]' Dizziness  '[]' Blackouts   '[]' Seizures   '[]' History of stroke   '[]' History of TIA  '[]' Aphasia   '[]' Temporary blindness   '[]' Dysphagia   '[]' Weakness or numbness in arms   '[]' Weakness or numbness in legs Musculoskeletal:  '[x]' Arthritis   '[]' Joint  swelling   '[]' Joint pain   '[x]' Low back  pain Hematologic:  '[]' Easy bruising  '[]' Easy bleeding   '[]' Hypercoagulable state   '[]' Anemic  '[]' Hepatitis Gastrointestinal:  '[]' Blood in stool   '[]' Vomiting blood  '[]' Gastroesophageal reflux/heartburn   '[]' Abdominal pain Genitourinary:  '[]' Chronic kidney disease   '[]' Difficult urination  '[]' Frequent urination  '[]' Burning with urination   '[]' Hematuria Skin:  '[]' Rashes   '[]' Ulcers   '[]' Wounds Psychological:  '[]' History of anxiety   '[]'  History of major depression.    Physical Exam BP (!) 157/64   Pulse 60   Ht 6' (1.829 m)   Wt 155 lb (70.3 kg)   BMI 21.02 kg/m  Gen:  WD/WN, NAD.  Appears younger than stated age Head: [place]/AT, No temporalis wasting.  Ear/Nose/Throat: Hearing grossly intact, nares w/o erythema or drainage, oropharynx w/o Erythema/Exudate Eyes: Conjunctiva clear, sclera non-icteric  Neck: trachea midline.  No JVD.  Pulmonary:  Good air movement, respirations not labored, no use of accessory muscles  Cardiac: RRR, no JVD Vascular:  Vessel Right Left  Radial Palpable Palpable                          DP  2+  2+  PT  2+  1+   Gastrointestinal:. No masses, surgical incisions, or scars. Musculoskeletal: M/S 5/5 throughout.  Extremities without ischemic changes.  No deformity or atrophy.  Prominent varicosities present bilaterally.  A little larger on the left leg than the right.  Largest diameter is about 3 mm.  Moderate to severe stasis dermatitis is also present on the left with more mild changes on the right.  No right leg edema.  1+ left lower extremity edema. Neurologic: Sensation grossly intact in extremities.  Symmetrical.  Speech is fluent. Motor exam as listed above. Psychiatric: Judgment intact, Mood & affect appropriate for pt's clinical situation. Dermatologic: No rashes or ulcers noted.  No cellulitis or open wounds.    Radiology No results found.  Labs Recent Results (from the past 2160 hour(s))  TSH     Status: None    Collection Time: 06/19/20  9:01 AM  Result Value Ref Range   TSH 1.570 0.450 - 4.500 uIU/mL  Lipid panel     Status: None   Collection Time: 06/19/20  9:01 AM  Result Value Ref Range   Cholesterol, Total 167 100 - 199 mg/dL   Triglycerides 87 0 - 149 mg/dL   HDL 84 >39 mg/dL   VLDL Cholesterol Cal 16 5 - 40 mg/dL   LDL Chol Calc (NIH) 67 0 - 99 mg/dL   Chol/HDL Ratio 2.0 0.0 - 5.0 ratio    Comment:                                   T. Chol/HDL Ratio                                             Men  Women                               1/2 Avg.Risk  3.4    3.3  Avg.Risk  5.0    4.4                                2X Avg.Risk  9.6    7.1                                3X Avg.Risk 23.4   11.0   Comprehensive Metabolic Panel (CMET)     Status: None   Collection Time: 06/19/20  9:01 AM  Result Value Ref Range   Glucose 90 65 - 99 mg/dL   BUN 19 8 - 27 mg/dL   Creatinine, Ser 1.05 0.76 - 1.27 mg/dL   GFR calc non Af Amer 71 >59 mL/min/1.73   GFR calc Af Amer 82 >59 mL/min/1.73    Comment: **[facility] currently reports eGFR in compliance with the current**   recommendations of the [employer]. [facility] will   update reporting as new guidelines are published from the NKF-ASN   Task force.    BUN/Creatinine Ratio 18 10 - 24   Sodium 137 134 - 144 mmol/L   Potassium 4.2 3.5 - 5.2 mmol/L   Chloride 99 96 - 106 mmol/L   CO2 26 20 - 29 mmol/L   Calcium 9.3 8.6 - 10.2 mg/dL   Total Protein 6.3 6.0 - 8.5 g/dL   Albumin 4.3 3.7 - 4.7 g/dL   Globulin, Total 2.0 1.5 - 4.5 g/dL   Albumin/Globulin Ratio 2.2 1.2 - 2.2   Bilirubin Total 0.6 0.0 - 1.2 mg/dL   Alkaline Phosphatase 53 48 - 121 IU/L   AST 19 0 - 40 IU/L   ALT 14 0 - 44 IU/L  CBC w/Diff/Platelet     Status: Abnormal   Collection Time: 06/19/20  9:01 AM  Result Value Ref Range   WBC 4.8 3.4 - 10.8 x10E3/uL   RBC 3.37 (L) 4.14 - 5.80 x10E6/uL   Hemoglobin 11.9 (L) 13.0 - 17.7 g/dL    Hematocrit 35.4 (L) 37.5 - 51.0 %   MCV 105 (H) 79 - 97 fL   MCH 35.3 (H) 26.6 - 33.0 pg   MCHC 33.6 31 - 35 g/dL   RDW 11.8 11.6 - 15.4 %   Platelets 175 150 - 450 x10E3/uL   Neutrophils 62 Not Estab. %   Lymphs 23 Not Estab. %   Monocytes 13 Not Estab. %   Eos 1 Not Estab. %   Basos 1 Not Estab. %   Neutrophils Absolute 3.0 1 - 7 x10E3/uL   Lymphocytes Absolute 1.1 0 - 3 x10E3/uL   Monocytes Absolute 0.6 0 - 0 x10E3/uL   EOS (ABSOLUTE) 0.1 0.0 - 0.4 x10E3/uL   Basophils Absolute 0.0 0 - 0 x10E3/uL   Immature Granulocytes 0 Not Estab. %   Immature Grans (Abs) 0.0 0.0 - 0.1 x10E3/uL  Vitamin B12     Status: None   Collection Time: 06/19/20  9:01 AM  Result Value Ref Range   Vitamin B-12 654 232 - 1,245 pg/mL  Hemoglobin A1C     Status: None   Collection Time: 06/19/20  9:01 AM  Result Value Ref Range   Hgb A1c MFr Bld 5.5 4.8 - 5.6 %    Comment:          Prediabetes: 5.7 - 6.4          Diabetes: >6.4  Glycemic control for adults with diabetes: <7.0    Est. average glucose Bld gHb Est-mCnc 111 mg/dL  SAR CoV2 Serology (COVID 19)AB(IGG)IA     Status: None   Collection Time: 06/19/20  9:01 AM  Result Value Ref Range   [employer] SARS-CoV-2 Ab, IgG Positive Negative    Comment: Results suggest recent or prior infection with SARS-CoV-2. Correlation with epidemiologic risk factors and other clinical and laboratory findings is recommended. Serologic results should not be used as the sole basis to diagnose or exclude recent SARS-CoV-2 infection. False positive results infrequently occur due to prior infection with other human Coronaviruses. This assay was performed using the [employer] Liaison(R) SARS-CoV-2 S1/S2 IgG assay. This assay detects antibodies against SARS-CoV-2 spike protein including the receptor binding domain (RBD).     Assessment/Plan:  Essential hypertension blood pressure control important in reducing the progression of atherosclerotic disease. On  appropriate oral medications.   Hyperlipemia lipid control important in reducing the progression of atherosclerotic disease. Continue statin therapy   Varicose veins with pain  Recommend:  The patient has large symptomatic varicose veins that are painful and associated with swelling.  I have had a long discussion with the patient regarding  varicose veins and why they cause symptoms.  Patient will begin wearing graduated compression stockings class 1 on a daily basis, beginning first thing in the morning and removing them in the evening. The patient is instructed specifically not to sleep in the stockings.    The patient  will also begin using over-the-counter analgesics such as Motrin 600 mg po TID to help control the symptoms.    In addition, behavioral modification including elevation during the day will be initiated.    Pending the results of these changes the  patient will be reevaluated in three months.   An  ultrasound of the venous system will be obtained.   Further plans will be based on the ultrasound results and whether conservative therapies are successful at eliminating the pain and swelling.   Back pain, chronic Some of his symptoms are likely neuropathic in nature although his venous insufficiency may be contributing as well.      Leotis Pain 08/03/2020, 11:28 AM   This note was created with Dragon medical transcription system.  Any errors from dictation are unintentional.

## 2020-08-03 NOTE — Assessment & Plan Note (Signed)
Recommend:  The patient has large symptomatic varicose veins that are painful and associated with swelling.  I have had a long discussion with the patient regarding  varicose veins and why they cause symptoms.  Patient will begin wearing graduated compression stockings class 1 on a daily basis, beginning first thing in the morning and removing them in the evening. The patient is instructed specifically not to sleep in the stockings.    The patient  will also begin using over-the-counter analgesics such as Motrin 600 mg po TID to help control the symptoms.    In addition, behavioral modification including elevation during the day will be initiated.    Pending the results of these changes the  patient will be reevaluated in three months.   An  ultrasound of the venous system will be obtained.   Further plans will be based on the ultrasound results and whether conservative therapies are successful at eliminating the pain and swelling.  

## 2020-08-03 NOTE — Patient Instructions (Signed)

## 2020-08-03 NOTE — Assessment & Plan Note (Signed)
blood pressure control important in reducing the progression of atherosclerotic disease. On appropriate oral medications.  

## 2020-08-16 DIAGNOSIS — Z862 Personal history of diseases of the blood and blood-forming organs and certain disorders involving the immune mechanism: Secondary | ICD-10-CM | POA: Diagnosis not present

## 2020-08-16 DIAGNOSIS — D649 Anemia, unspecified: Secondary | ICD-10-CM | POA: Diagnosis not present

## 2020-08-16 DIAGNOSIS — I7300 Raynaud's syndrome without gangrene: Secondary | ICD-10-CM | POA: Diagnosis not present

## 2020-08-21 ENCOUNTER — Other Ambulatory Visit: Payer: Self-pay | Admitting: Family Medicine

## 2020-08-21 NOTE — Telephone Encounter (Signed)
Requested medication (s) are due for refill today: Yes  Requested medication (s) are on the active medication list: Yes  Last refill:  08/16/19  Future visit scheduled: Yes  Notes to clinic:  Unable to refill per protocol, expired Rx     Requested Prescriptions  Pending Prescriptions Disp Refills   metoprolol succinate (TOPROL-XL) 50 MG 24 hr tablet [Pharmacy Med Name: METOPROLOL ER SUCCINATE 50MG  TABS] 90 tablet 3    Sig: TAKE 1 TABLET BY MOUTH ONCE DAILY, WITH OR IMMEDIATELY FOLLOWING A MEAL      Cardiovascular:  Beta Blockers Failed - 08/21/2020 11:12 AM      Failed - Last BP in normal range    BP Readings from Last 1 Encounters:  08/03/20 (!) 157/64          Passed - Last Heart Rate in normal range    Pulse Readings from Last 1 Encounters:  08/03/20 60          Passed - Valid encounter within last 6 months    Recent Outpatient Visits           2 months ago Essential hypertension   [facility] Jerrol Banana., MD   5 months ago Essential hypertension   [facility] Jerrol Banana., MD   9 months ago Traumatic complete tear of left rotator cuff, subsequent encounter   [facility] Jerrol Banana., MD   11 months ago Essential hypertension   [facility] Jerrol Banana., MD   1 year ago Anemia, unspecified type   [facility] Jerrol Banana., MD       Future Appointments             In 4 weeks Jerrol Banana., MD [facility], [place]

## 2020-08-29 DIAGNOSIS — B078 Other viral warts: Secondary | ICD-10-CM | POA: Diagnosis not present

## 2020-08-29 DIAGNOSIS — D2271 Melanocytic nevi of right lower limb, including hip: Secondary | ICD-10-CM | POA: Diagnosis not present

## 2020-08-29 DIAGNOSIS — X32XXXA Exposure to sunlight, initial encounter: Secondary | ICD-10-CM | POA: Diagnosis not present

## 2020-08-29 DIAGNOSIS — D225 Melanocytic nevi of trunk: Secondary | ICD-10-CM | POA: Diagnosis not present

## 2020-08-29 DIAGNOSIS — L538 Other specified erythematous conditions: Secondary | ICD-10-CM | POA: Diagnosis not present

## 2020-08-29 DIAGNOSIS — Z85828 Personal history of other malignant neoplasm of skin: Secondary | ICD-10-CM | POA: Diagnosis not present

## 2020-08-29 DIAGNOSIS — D2261 Melanocytic nevi of right upper limb, including shoulder: Secondary | ICD-10-CM | POA: Diagnosis not present

## 2020-08-29 DIAGNOSIS — L570 Actinic keratosis: Secondary | ICD-10-CM | POA: Diagnosis not present

## 2020-08-29 DIAGNOSIS — D2262 Melanocytic nevi of left upper limb, including shoulder: Secondary | ICD-10-CM | POA: Diagnosis not present

## 2020-08-30 DIAGNOSIS — H15102 Unspecified episcleritis, left eye: Secondary | ICD-10-CM | POA: Diagnosis not present

## 2020-09-03 ENCOUNTER — Emergency Department: Payer: Medicare Other

## 2020-09-03 ENCOUNTER — Emergency Department
Admission: EM | Admit: 2020-09-03 | Discharge: 2020-09-03 | Disposition: A | Discharge status: Home / Self Care | Payer: Medicare Other | Attending: Emergency Medicine | Admitting: Emergency Medicine

## 2020-09-03 ENCOUNTER — Other Ambulatory Visit: Payer: Self-pay

## 2020-09-03 DIAGNOSIS — R079 Chest pain, unspecified: Secondary | ICD-10-CM | POA: Insufficient documentation

## 2020-09-03 DIAGNOSIS — Z7982 Long term (current) use of aspirin: Secondary | ICD-10-CM | POA: Insufficient documentation

## 2020-09-03 DIAGNOSIS — Z79899 Other long term (current) drug therapy: Secondary | ICD-10-CM | POA: Diagnosis not present

## 2020-09-03 DIAGNOSIS — Z87891 Personal history of nicotine dependence: Secondary | ICD-10-CM | POA: Diagnosis not present

## 2020-09-03 DIAGNOSIS — I10 Essential (primary) hypertension: Secondary | ICD-10-CM | POA: Diagnosis not present

## 2020-09-03 DIAGNOSIS — R0602 Shortness of breath: Secondary | ICD-10-CM | POA: Insufficient documentation

## 2020-09-03 LAB — CBC
HCT: 36.9 % — ABNORMAL LOW (ref 39.0–52.0)
Hemoglobin: 12.5 g/dL — ABNORMAL LOW (ref 13.0–17.0)
MCH: 35.5 pg — ABNORMAL HIGH (ref 26.0–34.0)
MCHC: 33.9 g/dL (ref 30.0–36.0)
MCV: 104.8 fL — ABNORMAL HIGH (ref 80.0–100.0)
Platelets: 185 10*3/uL (ref 150–400)
RBC: 3.52 MIL/uL — ABNORMAL LOW (ref 4.22–5.81)
RDW: 12 % (ref 11.5–15.5)
WBC: 8.1 10*3/uL (ref 4.0–10.5)
nRBC: 0 % (ref 0.0–0.2)

## 2020-09-03 LAB — BASIC METABOLIC PANEL
Anion gap: 9 (ref 5–15)
BUN: 27 mg/dL — ABNORMAL HIGH (ref 8–23)
CO2: 28 mmol/L (ref 22–32)
Calcium: 9.2 mg/dL (ref 8.9–10.3)
Chloride: 97 mmol/L — ABNORMAL LOW (ref 98–111)
Creatinine, Ser: 1.22 mg/dL (ref 0.61–1.24)
GFR, Estimated: >60 mL/min (ref >60)
Glucose, Bld: 102 mg/dL — ABNORMAL HIGH (ref 70–99)
Potassium: 4.3 mmol/L (ref 3.5–5.1)
Sodium: 134 mmol/L — ABNORMAL LOW (ref 135–145)

## 2020-09-03 LAB — TROPONIN I (HIGH SENSITIVITY)
Troponin I (High Sensitivity): 4 ng/L (ref <18)
Troponin I (High Sensitivity): 5 ng/L (ref <18)

## 2020-09-03 MED ORDER — NITROGLYCERIN 0.4 MG SL SUBL
0.4000 mg | SUBLINGUAL_TABLET | SUBLINGUAL | Status: DC | PRN
  Administered 2020-09-03: 0.4 mg via SUBLINGUAL
  Filled 2020-09-03: qty 1

## 2020-09-03 MED ORDER — LIDOCAINE VISCOUS HCL 2 % MT SOLN
15.0000 mL | Freq: Once | OROMUCOSAL | Status: AC
  Administered 2020-09-03: 15 mL via ORAL
  Filled 2020-09-03: qty 15

## 2020-09-03 MED ORDER — ASPIRIN 81 MG PO CHEW
324.0000 mg | CHEWABLE_TABLET | Freq: Once | ORAL | Status: DC
  Filled 2020-09-03: qty 4

## 2020-09-03 MED ORDER — FAMOTIDINE 20 MG PO TABS: 20.0000 mg | ORAL_TABLET | Freq: Every day | ORAL | 1 refills | Status: DC

## 2020-09-03 MED ORDER — ASPIRIN 81 MG PO CHEW: 243.0000 mg | CHEWABLE_TABLET | Freq: Once | ORAL | Status: DC

## 2020-09-03 MED ORDER — ASPIRIN 81 MG PO CHEW
243.0000 mg | CHEWABLE_TABLET | Freq: Once | ORAL | Status: AC
  Administered 2020-09-03: 243 mg via ORAL
  Filled 2020-09-03: qty 3

## 2020-09-03 MED ORDER — IOHEXOL 350 MG/ML SOLN
75.0000 mL | Freq: Once | INTRAVENOUS | Status: AC | PRN
  Administered 2020-09-03: 75 mL via INTRAVENOUS

## 2020-09-03 MED ORDER — ALUM & MAG HYDROXIDE-SIMETH 200-200-20 MG/5ML PO SUSP
30.0000 mL | Freq: Once | ORAL | Status: AC
  Administered 2020-09-03: 30 mL via ORAL
  Filled 2020-09-03: qty 30

## 2020-09-03 NOTE — ED Provider Notes (Signed)
[facility] Emergency Department Provider Note   ____________________________________________   I have reviewed the triage vital signs and the nursing notes.   HISTORY  Chief Complaint Chest Pain   History limited by: Not Limited   HPI Daniel Owens is a 74 y.o. male who presents to the emergency department today because of concerns for chest pain.  The patient states that the pain is located in the center chest.  It started this afternoon.  He was sitting down when the pain started.  Has been constant since it started and gradually getting worse.  He describes it as a pressure-like pain.  It is worse with deep breaths.  Patient has felt short of breath with the pain.  He has not had any vomiting. Tried taking Tums at home without any significant relief. Denies similar pain in the past. States he is scheduled to have an ultrasound on his left leg tomorrow for some swelling and pain.  Records reviewed. Per medical record review patient has a history of HLD, HTN  Past Medical History:  Diagnosis Date  . Abnormal stress test    a. 07/2017 ETT: Ex 10:15, no chest pain, HTN response, 98mm ST dep in II, II, aVF, V4-V6 - returned to baseline in 5 mins-->Intermediate risk study.  . Aortic atherosclerosis ([place])    a. Incidentally noted on Chest CT in 2016.  . Carotid arterial disease ([place])    a. 07/2014 carotid u/s: mild <50%, bilat ICA stenosis.  . Hyperlipidemia   . Hypertension   . Macrocytic anemia   . osteoarthritis   . Palpitations    a. ? prior dx of PAF.  Marland Kitchen Raynauds syndrome   . Systolic murmur    a. Reports h/o rheumatic fever as a child;  b. 07/2017 Echo: EF 55-60%, no rwma, triv AI, nl RV fxn.    Patient Active Problem List   Diagnosis Date Noted  . Pallor of extremity 08/03/2020  . Varicose veins with pain 08/03/2020  . Primary osteoarthritis of right knee 03/22/2020  . Strain of tendon of left rotator cuff 06/30/2019  . Chronic anemia 11/09/2018   . H/O macrocytic anemia 11/09/2018  . DOE (dyspnea on exertion) 07/17/2017  . Heart murmur 07/17/2017  . Abnormal EKG 07/17/2017  . Carotid artery disease ([place]) 07/17/2017  . Aortic atherosclerosis ([place]) 05/20/2017  . Hyperlipemia 05/20/2017  . BPH associated with nocturia 08/05/2016  . Esophageal reflux 03/18/2015  . Arthritis 03/18/2015  . Back pain, chronic 03/18/2015  . Cardiac conduction disorder 03/18/2015  . Narrowing of intervertebral disc space 03/18/2015  . Essential hypertension 03/18/2015  . History of anemia 03/18/2015  . History of atrial fibrillation 03/18/2015  . Weak pulse 03/18/2015  . Palpitations 03/18/2015  . Peyronie's disease 03/18/2015  . Raynaud's syndrome 03/18/2015  . Complete rotator cuff rupture of left shoulder 05/12/2013    Past Surgical History:  Procedure Laterality Date  . APPENDECTOMY    . BILATERAL KNEE ARTHROSCOPY    . HERNIA REPAIR    . NECK SURGERY     involving disc  . NOSE SURGERY    . RETINAL DETACHMENT SURGERY    . SHOULDER SURGERY     Right-x 3 overall  . TENDON REPAIR     involving Tricep tendon    Prior to Admission medications   Medication Sig Start Date End Date Taking? Authorizing Provider  Ascorbic Acid (VITAMIN C) 1000 MG tablet Take 1,000 mg by mouth daily.    [provider]  aspirin EC 81 MG tablet Take 1 tablet (81 mg total) by mouth daily. 07/17/17   End, Harrell Gave, MD  B Complex Vitamins (VITAMIN B COMPLEX PO) Take 1 tablet by mouth daily.    [provider]  beta carotene 25000 UNIT capsule Take 25,000 Units by mouth daily.    [provider]  celecoxib (CELEBREX) 100 MG capsule TAKE 1 CAPSULE(100 MG) BY MOUTH DAILY AS NEEDED 12/07/19   Jerrol Banana., MD  celecoxib (CELEBREX) 200 MG capsule TAKE 1 CAPSULE BY MOUTH EVERY DAY 10/25/19   Jerrol Banana., MD  fish oil-omega-3 fatty acids 1000 MG capsule Take 1 g by mouth daily.    [provider]  fluticasone  (FLONASE) 50 MCG/ACT nasal spray Place 2 sprays into both nostrils daily. 02/21/19   Jerrol Banana., MD  folic acid (FOLVITE) 096 MCG tablet Take 1 tablet (400 mcg total) by mouth daily. 07/30/17   Jerrol Banana., MD  GLUCOSAMINE-CHONDROIT-VIT C-MN PO Take by mouth daily.  01/16/15   [provider]  losartan (COZAAR) 25 MG tablet TAKE 1 TABLET BY MOUTH DAILY 06/02/20   Jerrol Banana., MD  metoprolol succinate (TOPROL-XL) 50 MG 24 hr tablet TAKE 1 TABLET BY MOUTH ONCE DAILY, WITH OR IMMEDIATELY FOLLOWING A MEAL 08/22/20   Jerrol Banana., MD  Multiple Vitamin (MULTIVITAMIN WITH MINERALS) TABS tablet Take 1 tablet by mouth daily.    [provider]  nicotine polacrilex (NICORETTE) 4 MG gum Take 1 each (4 mg total) by mouth as needed for smoking cessation. 01/27/19   Jerrol Banana., MD  oxyCODONE (ROXICODONE) 15 MG immediate release tablet Take 1 tablet (15 mg total) by mouth every 4 (four) hours as needed for pain. 06/18/20   Jerrol Banana., MD  oxyCODONE (ROXICODONE) 15 MG immediate release tablet Take 1 tablet (15 mg total) by mouth every 6 (six) hours as needed for pain. 06/18/20   Jerrol Banana., MD  oxyCODONE (ROXICODONE) 15 MG immediate release tablet Take 1 tablet (15 mg total) by mouth every 4 (four) hours as needed for pain. 06/18/20   Jerrol Banana., MD  pregabalin (LYRICA) 75 MG capsule  07/19/20   [provider]  rosuvastatin (CRESTOR) 5 MG tablet TAKE 1 TABLET(5 MG) BY MOUTH DAILY 03/17/20   Jerrol Banana., MD  Selenium 200 MCG TABS Take 200 mcg by mouth daily.    [provider]  tadalafil (CIALIS) 5 MG tablet TAKE 1 TABLET BY MOUTH DAILY FOR BPH 10/25/18   Jerrol Banana., MD  Zinc 50 MG CAPS Take by mouth.    [provider]    Allergies Patient has no known allergies.  Family History  Problem Relation Age of Onset  . Stroke Mother   . Uterine cancer Mother   . Throat  cancer Father   . Allergic rhinitis Brother   . Prostate cancer Maternal Uncle   . Prostate cancer Paternal Uncle   . Heart attack Maternal Grandmother   . Heart attack Maternal Grandfather     Social History Social History   Tobacco Use  . Smoking status: Former Smoker    Types: Cigarettes    Quit date: 10/19/1977    Years since quitting: 42.9  . Smokeless tobacco: Current User    Types: Chew  . Tobacco comment: trying to quit  Vaping Use  . Vaping Use: Never used  Substance Use Topics  . Alcohol use: Yes    Alcohol/week: 0.0 standard drinks    Comment: very seldom- wine  . Drug use: No    Review of Systems Constitutional: No fever/chills Eyes: No visual changes. ENT: No sore throat. Cardiovascular: Positive for chest pain. Respiratory: Positive for shortness of breath. Gastrointestinal: No abdominal pain.  No nausea, no vomiting.  No diarrhea.   Genitourinary: Negative for dysuria. Musculoskeletal: Negative for back pain. Skin: Negative for rash. Neurological: Negative for headaches, focal weakness or numbness.  ____________________________________________   PHYSICAL EXAM:  VITAL SIGNS: ED Triage Vitals  Enc Vitals Group     BP 09/03/20 1718 (!) 150/51     Pulse Rate 09/03/20 1718 71     Resp 09/03/20 1718 18     Temp 09/03/20 1718 98.1 F (36.7 C)     Temp Source 09/03/20 1718 Oral     SpO2 09/03/20 1718 96 %     Weight 09/03/20 1715 156 lb (70.8 kg)     Height 09/03/20 1715 6' (1.829 m)     Head Circumference --      Peak Flow --      Pain Score 09/03/20 1715 10   Constitutional: Alert and oriented.  Eyes: Conjunctivae are normal.  ENT      Head: Normocephalic and atraumatic.      Nose: No congestion/rhinnorhea.      Mouth/Throat: Mucous membranes are moist.      Neck: No stridor. Hematological/Lymphatic/Immunilogical: No cervical lymphadenopathy. Cardiovascular: Normal rate, regular rhythm.  No murmurs, rubs, or gallops.  Respiratory: Normal  respiratory effort without tachypnea nor retractions. Breath sounds are clear and equal bilaterally. No wheezes/rales/rhonchi. Gastrointestinal: Soft and non tender. No rebound. No guarding.  Genitourinary: Deferred Musculoskeletal: Normal range of motion in all extremities. No lower extremity edema. Neurologic:  Normal speech and language. No gross focal neurologic deficits are appreciated.  Skin:  Skin is warm, dry and intact. No rash noted. Psychiatric: Mood and affect are normal. Speech and behavior are normal. Patient exhibits appropriate insight and judgment.  ____________________________________________    LABS (pertinent positives/negatives)  CBC wbc 8.1, hgb 12.5, plt 185 Trop hs 4 to 5 BMP na 134, k 4.3, glu 102, cr 1.22 ____________________________________________   EKG  I, Nance Pear, attending physician, personally viewed and interpreted this EKG  EKG Time: 1712 Rate: 73 Rhythm: sinus rhythm with 1st degree av block Axis: normal Intervals: qtc 420 QRS: narrow ST changes: no st elevation Impression: abnormal ekg  ____________________________________________    RADIOLOGY  CXR No acute abnormality  CT angio No PE. Age indeterminate vertebral compression fractures.  ____________________________________________   PROCEDURES  Procedures  ____________________________________________   INITIAL IMPRESSION / ASSESSMENT AND PLAN / ED COURSE  Pertinent labs & imaging results that were available during my care of the patient were reviewed by me and considered in my medical decision making (see chart for details).   Patient presented to the emergency department today because of concerns for chest pain.  Differential would be broad including ACS, dissection, PE, pneumonia, pneumothorax, esophagitis amongst other etiologies.  Given that the pain was worse with deep breaths I did have some concern for PE and CT angio was obtained.  This was negative for  pulmonary embolism.  The patient had troponin negative x2.  He did feel better after treatment here in the emergency department.  At this point I do wonder if part of the patient's symptoms are secondary to acid reflux.  I  discussed this with the patient.  Discussed portance of follow-up.  ____________________________________________   FINAL CLINICAL IMPRESSION(S) / ED DIAGNOSES  Final diagnoses:  Nonspecific chest pain     Note: This dictation was prepared with Dragon dictation. Any transcriptional errors that result from this process are unintentional     Nance Pear, MD 09/03/20 2057

## 2020-09-03 NOTE — ED Triage Notes (Signed)
Pt to ED POV with centralized CP that started at 1300 today.  Denies N/V Reports SHOB that worsened today.  Pt holding center of chest and appears in pain Clear speech, alert and oriented

## 2020-09-03 NOTE — Discharge Instructions (Signed)
Please seek medical attention for any high fevers, chest pain, shortness of breath, change in behavior, persistent vomiting, bloody stool or any other new or concerning symptoms.  

## 2020-09-04 ENCOUNTER — Telehealth: Payer: Self-pay

## 2020-09-04 ENCOUNTER — Ambulatory Visit: Payer: Self-pay | Admitting: *Deleted

## 2020-09-04 ENCOUNTER — Encounter (INDEPENDENT_AMBULATORY_CARE_PROVIDER_SITE_OTHER): Payer: Self-pay | Admitting: Nurse Practitioner

## 2020-09-04 ENCOUNTER — Ambulatory Visit (INDEPENDENT_AMBULATORY_CARE_PROVIDER_SITE_OTHER): Payer: Medicare Other

## 2020-09-04 ENCOUNTER — Ambulatory Visit (INDEPENDENT_AMBULATORY_CARE_PROVIDER_SITE_OTHER): Payer: Medicare Other | Admitting: Nurse Practitioner

## 2020-09-04 VITALS — BP 121/62 | HR 62 | Resp 16 | Wt 157.4 lb

## 2020-09-04 DIAGNOSIS — G8929 Other chronic pain: Secondary | ICD-10-CM | POA: Diagnosis not present

## 2020-09-04 DIAGNOSIS — M5441 Lumbago with sciatica, right side: Secondary | ICD-10-CM

## 2020-09-04 DIAGNOSIS — I83813 Varicose veins of bilateral lower extremities with pain: Secondary | ICD-10-CM | POA: Diagnosis not present

## 2020-09-04 DIAGNOSIS — I10 Essential (primary) hypertension: Secondary | ICD-10-CM

## 2020-09-04 DIAGNOSIS — I83819 Varicose veins of unspecified lower extremities with pain: Secondary | ICD-10-CM

## 2020-09-04 DIAGNOSIS — M5442 Lumbago with sciatica, left side: Secondary | ICD-10-CM | POA: Diagnosis not present

## 2020-09-04 NOTE — Telephone Encounter (Signed)
See Nurse Triage note.  Thanks,   Mickel Baas    Copied from [place] [phone]. Topic: Quick Communication - See Telephone Encounter >> Sep 04, 2020 11:18 AM Loma Boston wrote: CRM for notification. See Telephone encounter for: 09/04/20. Ptwas calling in to get a referral to a Ottis Stain for endoscopy after being in hospital ER last nite, talked to Mickel Baas and was to send CRM in the middle of getting information for referral and disconnected, call pt back and left voice mail >> Sep 04, 2020 11:27 AM Lennox Solders wrote: Pt is calling back and dr tendler phone number is [phone] and fax number is [phone]. Please call patient back >> Sep 04, 2020 11:25 AM Lennox Solders wrote: Pt called back and

## 2020-09-04 NOTE — Telephone Encounter (Signed)
°  C/o severe chest pain and reports pain with taking a deep breath . Patient reports he was at ED yesterday for 5 hours and had multiple tests completed and did not suggest his heart was the cause. Reports pressure when bending forward and has worsening pain. Denies SOB, difficulty breathing, sweating , nausea, vomiting, no bad taste in mouth. Patient requesting referral to GI. Hx of inflamed esophagus in the past and needs referral to get seen again.  Reports he has been seen by Dr. Ottis Stain fax [phone] if PCP will refer again. Reviewed with patient if chest pain remains severe to go back to ED. Care advise given. Patient verbalized understanding of care advise and does not want to go back to ED at this time. Please advise.   Reason for Disposition  [1] Patient says chest pain feels exactly the same as previously diagnosed "heartburn" AND [2] describes burning in chest AND [3] accompanying sour taste in mouth  [1] Chest pain lasts > 5 minutes AND [2] occurred in past 3 days (72 hours) (Exception: feels exactly the same as previously diagnosed heartburn and has accompanying sour taste in mouth)  Answer Assessment - Initial Assessment Questions 1. LOCATION: "Where does it hurt?"       Chest area 2. RADIATION: "Does the pain go anywhere else?" (e.g., into neck, jaw, arms, back)     Na  3. ONSET: "When did the chest pain begin?" (Minutes, hours or days)     Went to ED yesterday and stayed 5 hours   4. PATTERN "Does the pain come and go, or has it been constant since it started?"  "Does it get worse with exertion?"      Hurts worse taking deep breath and bending forward 5. DURATION: "How long does it last" (e.g., seconds, minutes, hours)     Constant pain  6. SEVERITY: "How bad is the pain?"  (e.g., Scale 1-10; mild, moderate, or severe)    - MILD (1-3): doesn't interfere with normal activities     - MODERATE (4-7): interferes with normal activities or awakens from sleep    - SEVERE  (8-10): excruciating pain, unable to do any normal activities       moderate 7. CARDIAC RISK FACTORS: "Do you have any history of heart problems or risk factors for heart disease?" (e.g., angina, prior heart attack; diabetes, high blood pressure, high cholesterol, smoker, or strong family history of heart disease)     No  8. PULMONARY RISK FACTORS: "Do you have any history of lung disease?"  (e.g., blood clots in lung, asthma, emphysema, birth control pills)     no 9. CAUSE: "What do you think is causing the chest pain?"     Possible inflamed esophagus 10. OTHER SYMPTOMS: "Do you have any other symptoms?" (e.g., dizziness, nausea, vomiting, sweating, fever, difficulty breathing, cough)       No  11. PREGNANCY: "Is there any chance you are pregnant?" "When was your last menstrual period?"       na  Protocols used: CHEST PAIN-A-AH

## 2020-09-05 DIAGNOSIS — K219 Gastro-esophageal reflux disease without esophagitis: Secondary | ICD-10-CM | POA: Diagnosis not present

## 2020-09-05 DIAGNOSIS — R079 Chest pain, unspecified: Secondary | ICD-10-CM | POA: Diagnosis not present

## 2020-09-05 DIAGNOSIS — K2950 Unspecified chronic gastritis without bleeding: Secondary | ICD-10-CM | POA: Diagnosis not present

## 2020-09-05 DIAGNOSIS — R109 Unspecified abdominal pain: Secondary | ICD-10-CM | POA: Diagnosis not present

## 2020-09-09 ENCOUNTER — Encounter (INDEPENDENT_AMBULATORY_CARE_PROVIDER_SITE_OTHER): Payer: Self-pay | Admitting: Nurse Practitioner

## 2020-09-09 NOTE — Progress Notes (Signed)
Subjective:    Patient ID: Daniel Owens, male    DOB: 12/30/1945, 74 y.o.   MRN: [id number] Chief Complaint  Patient presents with  . Follow-up    ultrasound follow up    Daniel Owens is a 74 y.o. male.  Patient presents with pain, discoloration, and prominent varicosities particularly in the left lower extremity.  The patient notes that he had a endovenous laser ablation done years ago on his left lower extremity.  The patient also has known back issues as well as neuropathy.  The patient notes that he has had an achy pain to the left lower extremity.  Several weeks ago this pain was very severe and it radiated to the outside of his leg.  The patient notes some discomfort in the right leg but is not severe as the left.  The patient also has stasis dermatitis seen bilaterally with the left being greater than the right.  Today noninvasive study showed no evidence of DVT or superficial venous thrombosis seen bilaterally.  No evidence of deep venous insufficiency or superficial venous reflux seen bilaterally.  Does appear to be a Baker's cyst behind the right knee.     Review of Systems  Cardiovascular: Positive for leg swelling.  Musculoskeletal: Positive for back pain.  All other systems reviewed and are negative.      Objective:   Physical Exam Vitals reviewed.  HENT:     Head: Normocephalic.  Cardiovascular:     Rate and Rhythm: Normal rate.     Pulses: Normal pulses.  Pulmonary:     Effort: Pulmonary effort is normal.  Musculoskeletal:     Right lower leg: Edema present.     Left lower leg: Edema present.  Neurological:     Mental Status: He is alert and oriented to person, place, and time.  Psychiatric:        Mood and Affect: Mood normal.        Behavior: Behavior normal.        Thought Content: Thought content normal.        Judgment: Judgment normal.     BP 121/62 (BP Location: Left Arm)   Pulse 62   Resp 16   Wt 157 lb 6.4 oz (71.4 kg)   BMI 21.35 kg/m    Past Medical History:  Diagnosis Date  . Abnormal stress test    a. 07/2017 ETT: Ex 10:15, no chest pain, HTN response, 43mm ST dep in II, II, aVF, V4-V6 - returned to baseline in 5 mins-->Intermediate risk study.  . Aortic atherosclerosis ([place])    a. Incidentally noted on Chest CT in 2016.  . Carotid arterial disease ([place])    a. 07/2014 carotid u/s: mild <50%, bilat ICA stenosis.  . Hyperlipidemia   . Hypertension   . Macrocytic anemia   . osteoarthritis   . Palpitations    a. ? prior dx of PAF.  Marland Kitchen Raynauds syndrome   . Systolic murmur    a. Reports h/o rheumatic fever as a child;  b. 07/2017 Echo: EF 55-60%, no rwma, triv AI, nl RV fxn.    Social History   Socioeconomic History  . Marital status: Married    Spouse name: Not on file  . Number of children: 2  . Years of education: Not on file  . Highest education level: Associate degree: academic program  Occupational History  . Occupation: retired  Tobacco Use  . Smoking status: Former Smoker    Types: Cigarettes  Quit date: 10/19/1977    Years since quitting: 42.9  . Smokeless tobacco: Current User    Types: Chew  . Tobacco comment: trying to quit  Vaping Use  . Vaping Use: Never used  Substance and Sexual Activity  . Alcohol use: Yes    Alcohol/week: 0.0 standard drinks    Comment: very seldom- wine  . Drug use: No  . Sexual activity: Not on file  Other Topics Concern  . Not on file  Social History Narrative  . Not on file   Social Determinants of Health   Financial Resource Strain: Low Risk   . Difficulty of Paying Living Expenses: Not hard at all  Food Insecurity: No Food Insecurity  . Worried About Charity fundraiser in the Last Year: Never true  . Ran Out of Food in the Last Year: Never true  Transportation Needs: No Transportation Needs  . Lack of Transportation (Medical): No  . Lack of Transportation (Non-Medical): No  Physical Activity: Inactive  . Days of Exercise per Week: 0 days  .  Minutes of Exercise per Session: 0 min  Stress: No Stress Concern Present  . Feeling of Stress : Not at all  Social Connections: Socially Integrated  . Frequency of Communication with Friends and Family: More than three times a week  . Frequency of Social Gatherings with Friends and Family: More than three times a week  . Attends Religious Services: More than 4 times per year  . Active Member of Clubs or Organizations: Yes  . Attends Archivist Meetings: Never  . Marital Status: Married  Human resources officer Violence: Not At Risk  . Fear of Current or Ex-Partner: No  . Emotionally Abused: No  . Physically Abused: No  . Sexually Abused: No    Past Surgical History:  Procedure Laterality Date  . APPENDECTOMY    . BILATERAL KNEE ARTHROSCOPY    . HERNIA REPAIR    . NECK SURGERY     involving disc  . NOSE SURGERY    . RETINAL DETACHMENT SURGERY    . SHOULDER SURGERY     Right-x 3 overall  . TENDON REPAIR     involving Tricep tendon    Family History  Problem Relation Age of Onset  . Stroke Mother   . Uterine cancer Mother   . Throat cancer Father   . Allergic rhinitis Brother   . Prostate cancer Maternal Uncle   . Prostate cancer Paternal Uncle   . Heart attack Maternal Grandmother   . Heart attack Maternal Grandfather     No Known Allergies  CBC Latest Ref Rng & Units 09/03/2020 06/19/2020 08/29/2019  WBC 4.0 - 10.5 K/uL 8.1 4.8 5.3  Hemoglobin 13.0 - 17.0 g/dL 12.5(L) 11.9(L) 11.5(L)  Hematocrit 39 - 52 % 36.9(L) 35.4(L) 34.0(L)  Platelets 150 - 400 K/uL 185 175 178      CMP     Component Value Date/Time   NA 134 (L) 09/03/2020 1718   NA 137 06/19/2020 0901   K 4.3 09/03/2020 1718   CL 97 (L) 09/03/2020 1718   CO2 28 09/03/2020 1718   GLUCOSE 102 (H) 09/03/2020 1718   BUN 27 (H) 09/03/2020 1718   BUN 19 06/19/2020 0901   CREATININE 1.22 09/03/2020 1718   CALCIUM 9.2 09/03/2020 1718   PROT 6.3 06/19/2020 0901   ALBUMIN 4.3 06/19/2020 0901   AST 19  06/19/2020 0901   ALT 14 06/19/2020 0901   ALKPHOS 53 06/19/2020 0901  BILITOT 0.6 06/19/2020 0901   GFRNONAA >60 09/03/2020 1718   GFRAA 82 06/19/2020 0901     No results found.     Assessment & Plan:   1. Varicose veins with pain No surgery or intervention at this point in time.    I have had a long discussion with the patient regarding venous insufficiency and why it  causes symptoms. I have discussed with the patient the chronic skin changes that accompany venous insufficiency and the long term sequela such as infection and ulceration.  Patient will begin wearing graduated compression stockings class 1 (20-30 mmHg) or compression wraps on a daily basis a prescription was given. The patient will put the stockings on first thing in the morning and removing them in the evening. The patient is instructed specifically not to sleep in the stockings.    In addition, behavioral modification including several periods of elevation of the lower extremities during the day will be continued. I have demonstrated that proper elevation is a position with the ankles at heart level.  The patient is instructed to begin routine exercise, especially walking on a daily basis    2. Chronic bilateral low back pain with bilateral sciatica The patient's back and sciatica pain may be contributing to her lower extremity symptoms.  Patient is advised to follow-up with PCP for further work-up  3. Essential hypertension Continue antihypertensive medications as already ordered, these medications have been reviewed and there are no changes at this time.    Current Outpatient Medications on File Prior to Visit  Medication Sig Dispense Refill  . Ascorbic Acid (VITAMIN C) 1000 MG tablet Take 1,000 mg by mouth daily.    Marland Kitchen aspirin EC 81 MG tablet Take 1 tablet (81 mg total) by mouth daily. 90 tablet 3  . B Complex Vitamins (VITAMIN B COMPLEX PO) Take 1 tablet by mouth daily.    . beta carotene 25000 UNIT  capsule Take 25,000 Units by mouth daily.    . celecoxib (CELEBREX) 100 MG capsule TAKE 1 CAPSULE(100 MG) BY MOUTH DAILY AS NEEDED 30 capsule 5  . celecoxib (CELEBREX) 200 MG capsule TAKE 1 CAPSULE BY MOUTH EVERY DAY 90 capsule 1  . famotidine (PEPCID) 20 MG tablet Take 1 tablet (20 mg total) by mouth daily. 30 tablet 1  . fish oil-omega-3 fatty acids 1000 MG capsule Take 1 g by mouth daily.    . fluticasone (FLONASE) 50 MCG/ACT nasal spray Place 2 sprays into both nostrils daily. 16 g 6  . folic acid (FOLVITE) 237 MCG tablet Take 1 tablet (400 mcg total) by mouth daily. 30 tablet 0  . GLUCOSAMINE-CHONDROIT-VIT C-MN PO Take by mouth daily.     Marland Kitchen losartan (COZAAR) 25 MG tablet TAKE 1 TABLET BY MOUTH DAILY 90 tablet 1  . metoprolol succinate (TOPROL-XL) 50 MG 24 hr tablet TAKE 1 TABLET BY MOUTH ONCE DAILY, WITH OR IMMEDIATELY FOLLOWING A MEAL 90 tablet 3  . Multiple Vitamin (MULTIVITAMIN WITH MINERALS) TABS tablet Take 1 tablet by mouth daily.    . nicotine polacrilex (NICORETTE) 4 MG gum Take 1 each (4 mg total) by mouth as needed for smoking cessation. 100 tablet 0  . oxyCODONE (ROXICODONE) 15 MG immediate release tablet Take 1 tablet (15 mg total) by mouth every 4 (four) hours as needed for pain. 120 tablet 0  . oxyCODONE (ROXICODONE) 15 MG immediate release tablet Take 1 tablet (15 mg total) by mouth every 6 (six) hours as needed for pain. 120 tablet  0  . oxyCODONE (ROXICODONE) 15 MG immediate release tablet Take 1 tablet (15 mg total) by mouth every 4 (four) hours as needed for pain. 120 tablet 0  . pregabalin (LYRICA) 75 MG capsule     . rosuvastatin (CRESTOR) 5 MG tablet TAKE 1 TABLET(5 MG) BY MOUTH DAILY 90 tablet 3  . Selenium 200 MCG TABS Take 200 mcg by mouth daily.    . tadalafil (CIALIS) 5 MG tablet TAKE 1 TABLET BY MOUTH DAILY FOR BPH 90 tablet 3  . Zinc 50 MG CAPS Take by mouth.     No current facility-administered medications on file prior to visit.    There are no Patient  Instructions on file for this visit. No follow-ups on file.   Kris Hartmann, NP

## 2020-09-18 ENCOUNTER — Other Ambulatory Visit: Payer: Self-pay

## 2020-09-18 ENCOUNTER — Encounter: Payer: Self-pay | Admitting: Family Medicine

## 2020-09-18 ENCOUNTER — Ambulatory Visit (INDEPENDENT_AMBULATORY_CARE_PROVIDER_SITE_OTHER): Payer: Medicare Other | Admitting: Family Medicine

## 2020-09-18 VITALS — BP 122/54 | HR 57 | Temp 98.5°F | Resp 16 | Ht 72.0 in | Wt 161.0 lb

## 2020-09-18 DIAGNOSIS — M5441 Lumbago with sciatica, right side: Secondary | ICD-10-CM

## 2020-09-18 DIAGNOSIS — R351 Nocturia: Secondary | ICD-10-CM | POA: Diagnosis not present

## 2020-09-18 DIAGNOSIS — E7800 Pure hypercholesterolemia, unspecified: Secondary | ICD-10-CM | POA: Diagnosis not present

## 2020-09-18 DIAGNOSIS — I10 Essential (primary) hypertension: Secondary | ICD-10-CM

## 2020-09-18 DIAGNOSIS — G8929 Other chronic pain: Secondary | ICD-10-CM | POA: Diagnosis not present

## 2020-09-18 DIAGNOSIS — N401 Enlarged prostate with lower urinary tract symptoms: Secondary | ICD-10-CM | POA: Diagnosis not present

## 2020-09-18 DIAGNOSIS — K219 Gastro-esophageal reflux disease without esophagitis: Secondary | ICD-10-CM

## 2020-09-18 DIAGNOSIS — S46012S Strain of muscle(s) and tendon(s) of the rotator cuff of left shoulder, sequela: Secondary | ICD-10-CM

## 2020-09-18 DIAGNOSIS — M5442 Lumbago with sciatica, left side: Secondary | ICD-10-CM | POA: Diagnosis not present

## 2020-09-18 MED ORDER — OXYCODONE HCL 15 MG PO TABS: 15.0000 mg | ORAL_TABLET | Freq: Four times a day (QID) | ORAL | 0 refills | Status: DC | PRN

## 2020-09-18 MED ORDER — OXYCODONE HCL 15 MG PO TABS: 15.0000 mg | ORAL_TABLET | ORAL | 0 refills | Status: DC | PRN

## 2020-09-18 MED ORDER — OMEPRAZOLE 20 MG PO CPDR
20.0000 mg | DELAYED_RELEASE_CAPSULE | Freq: Every day | ORAL | 4 refills | Status: DC
Start: 2020-09-18 — End: 2021-10-15

## 2020-09-18 MED ORDER — TAMSULOSIN HCL 0.4 MG PO CAPS: 0.4000 mg | ORAL_CAPSULE | Freq: Every day | ORAL | 3 refills | Status: DC

## 2020-09-18 NOTE — Progress Notes (Signed)
I,[name],acting as a scribe for Daniel Durie, MD.,have documented all relevant documentation on the behalf of Daniel Durie, MD,as directed by  Daniel Durie, MD while in the presence of Daniel Durie, MD.   Established patient visit   Patient: Daniel Owens   DOB: [birth year]   74 y.o. Male  MRN: [id number] Visit Date: 09/18/2020  Today's healthcare provider: [name], MD   Chief Complaint  Patient presents with  . Follow-up  . Hypertension   Subjective    HPI  Patient states he needs lifelong omeprazole for PPI per his GI doctor. He complains of chronic pain.  He complains of chronic neuropathy. He also has BPH symptoms with decreased flow. Hypertension, follow-up  BP Readings from Last 3 Encounters:  09/18/20 (!) 122/54  09/04/20 121/62  09/03/20 (!) 142/62   Wt Readings from Last 3 Encounters:  09/18/20 161 lb (73 kg)  09/04/20 157 lb 6.4 oz (71.4 kg)  09/03/20 156 lb (70.8 kg)     He was last seen for hypertension 3 months ago.  BP at that visit was 135/58. Management since that visit includes; Controlled on metoprolol and losartan. He reports good compliance with treatment. He is not having side effects. none He some walking exercising. He is adherent to low salt diet.   Outside blood pressures are not checking.  He does not smoke.  Use of agents associated with hypertension: none.   --------------------------------------------------------------------  Other chronic pain From 06/18/2020-Chronic back pain.  Would not increase the dose of his oxycodone.  Neuropathy From 06/18/2020-Start alpha lipoic acid daily.  History of elevated glucose From 06/18/2020-Hemoglobin A1C--5.5.  Bilateral cold feet From 06/18/2020-Patient wishes referral to vascular.  Pulses and  capillary refill.  Good today.      Medications: Outpatient Medications Prior to Visit  Medication Sig  . Ascorbic Acid (VITAMIN C) 1000 MG tablet Take  1,000 mg by mouth daily.  . B Complex Vitamins (VITAMIN B COMPLEX PO) Take 1 tablet by mouth daily.  . beta carotene 25000 UNIT capsule Take 25,000 Units by mouth daily.  . celecoxib (CELEBREX) 200 MG capsule TAKE 1 CAPSULE BY MOUTH EVERY DAY  . fish oil-omega-3 fatty acids 1000 MG capsule Take 1 g by mouth daily.  . fluticasone (FLONASE) 50 MCG/ACT nasal spray Place 2 sprays into both nostrils daily.  . folic acid (FOLVITE) 382 MCG tablet Take 1 tablet (400 mcg total) by mouth daily.  Marland Kitchen GLUCOSAMINE-CHONDROIT-VIT C-MN PO Take by mouth daily.   Marland Kitchen losartan (COZAAR) 25 MG tablet TAKE 1 TABLET BY MOUTH DAILY  . metoprolol succinate (TOPROL-XL) 50 MG 24 hr tablet TAKE 1 TABLET BY MOUTH ONCE DAILY, WITH OR IMMEDIATELY FOLLOWING A MEAL  . Multiple Vitamin (MULTIVITAMIN WITH MINERALS) TABS tablet Take 1 tablet by mouth daily.  Marland Kitchen oxyCODONE (ROXICODONE) 15 MG immediate release tablet Take 1 tablet (15 mg total) by mouth every 4 (four) hours as needed for pain.  . rosuvastatin (CRESTOR) 5 MG tablet TAKE 1 TABLET(5 MG) BY MOUTH DAILY  . Selenium 200 MCG TABS Take 200 mcg by mouth daily.  . Zinc 50 MG CAPS Take by mouth.  Marland Kitchen aspirin EC 81 MG tablet Take 1 tablet (81 mg total) by mouth daily. (Patient not taking: Reported on 09/18/2020)  . celecoxib (CELEBREX) 100 MG capsule TAKE 1 CAPSULE(100 MG) BY MOUTH DAILY AS NEEDED (Patient not taking: Reported on 09/18/2020)  . famotidine (PEPCID) 20 MG tablet Take 1 tablet (20  mg total) by mouth daily. (Patient not taking: Reported on 09/18/2020)  . nicotine polacrilex (NICORETTE) 4 MG gum Take 1 each (4 mg total) by mouth as needed for smoking cessation. (Patient not taking: Reported on 09/18/2020)  . oxyCODONE (ROXICODONE) 15 MG immediate release tablet Take 1 tablet (15 mg total) by mouth every 6 (six) hours as needed for pain. (Patient not taking: Reported on 09/18/2020)  . oxyCODONE (ROXICODONE) 15 MG immediate release tablet Take 1 tablet (15 mg total) by mouth  every 4 (four) hours as needed for pain. (Patient not taking: Reported on 09/18/2020)  . pregabalin (LYRICA) 75 MG capsule  (Patient not taking: Reported on 09/18/2020)  . tadalafil (CIALIS) 5 MG tablet TAKE 1 TABLET BY MOUTH DAILY FOR BPH (Patient not taking: Reported on 09/18/2020)   No facility-administered medications prior to visit.    Review of Systems  Constitutional: Negative for appetite change, chills and fever.  Respiratory: Negative for chest tightness, shortness of breath and wheezing.   Cardiovascular: Negative for chest pain and palpitations.  Gastrointestinal: Negative for abdominal pain, nausea and vomiting.       Objective    BP (!) 122/54 (BP Location: Right Arm, Patient Position: Sitting, Cuff Size: Large)   Pulse (!) 57   Temp 98.5 F (36.9 C) (Oral)   Resp 16   Ht 6' (1.829 m)   Wt 161 lb (73 kg)   SpO2 98%   BMI 21.84 kg/m  BP Readings from Last 3 Encounters:  09/18/20 (!) 122/54  09/04/20 121/62  09/03/20 (!) 142/62   Wt Readings from Last 3 Encounters:  09/18/20 161 lb (73 kg)  09/04/20 157 lb 6.4 oz (71.4 kg)  09/03/20 156 lb (70.8 kg)      Physical Exam Vitals reviewed.  Constitutional:      Appearance: Normal appearance.  HENT:     Head: Normocephalic and atraumatic.     Right Ear: External ear normal.     Left Ear: External ear normal.  Eyes:     General: No scleral icterus.    Conjunctiva/sclera: Conjunctivae normal.  Cardiovascular:     Rate and Rhythm: Normal rate and regular rhythm.     Pulses: Normal pulses.     Heart sounds: Normal heart sounds.     Comments: Pulses good. Large varicose veins. Chronic skin changes due to venous stasis. Pulmonary:     Effort: Pulmonary effort is normal.     Breath sounds: Normal breath sounds.  Musculoskeletal:     Right lower leg: No edema.     Left lower leg: No edema.  Skin:    General: Skin is warm and dry.     Capillary Refill: Capillary refill takes 2 to 3 seconds.  Neurological:      General: No focal deficit present.     Mental Status: He is alert and oriented to person, place, and time.  Psychiatric:        Mood and Affect: Mood normal.        Behavior: Behavior normal.        Thought Content: Thought content normal.        Judgment: Judgment normal.       No results found for any visits on 09/18/20.  Assessment & Plan     1. BPH associated with nocturia Right annulus and. - PSA - tamsulosin (FLOMAX) 0.4 MG CAPS capsule; Take 1 capsule (0.4 mg total) by mouth daily.  Dispense: 30 capsule; Refill: 3  2. Other  chronic pain Would not increase dose of narcotic.  Urine drug screen on next visit. - oxyCODONE (ROXICODONE) 15 MG immediate release tablet; Take 1 tablet (15 mg total) by mouth every 4 (four) hours as needed for pain.  Dispense: 120 tablet; Refill: 0 - oxyCODONE (ROXICODONE) 15 MG immediate release tablet; Take 1 tablet (15 mg total) by mouth every 6 (six) hours as needed for pain.  Dispense: 120 tablet; Refill: 0 - oxyCODONE (ROXICODONE) 15 MG immediate release tablet; Take 1 tablet (15 mg total) by mouth every 4 (four) hours as needed for pain.  Dispense: 120 tablet; Refill: 0  3. Essential hypertension Controlled 4. Gastroesophageal reflux disease, unspecified whether esophagitis present Lifelong PPI  5. Traumatic complete tear of left rotator cuff, sequela   6. Pure hypercholesterolemia   7. Chronic bilateral low back pain with bilateral sciatica    No follow-ups on file.      I, Daniel Durie, MD, have reviewed all documentation for this visit. The documentation on 09/22/20 for the exam, diagnosis, procedures, and orders are all accurate and complete.    Firmin Belisle Cranford Mon, MD  [facility] [phone] (phone) [phone] (fax)  [place]

## 2020-09-18 NOTE — Patient Instructions (Signed)
Try over-the-counter Alpha Lipoic Acid. -

## 2020-09-19 DIAGNOSIS — R351 Nocturia: Secondary | ICD-10-CM | POA: Diagnosis not present

## 2020-09-19 DIAGNOSIS — N401 Enlarged prostate with lower urinary tract symptoms: Secondary | ICD-10-CM | POA: Diagnosis not present

## 2020-09-20 LAB — PSA: Prostate Specific Ag, Serum: 1.4 ng/mL (ref 0.0–4.0)

## 2020-11-01 DIAGNOSIS — M5416 Radiculopathy, lumbar region: Secondary | ICD-10-CM | POA: Diagnosis not present

## 2020-11-01 DIAGNOSIS — M5117 Intervertebral disc disorders with radiculopathy, lumbosacral region: Secondary | ICD-10-CM | POA: Diagnosis not present

## 2020-11-21 ENCOUNTER — Ambulatory Visit (INDEPENDENT_AMBULATORY_CARE_PROVIDER_SITE_OTHER): Payer: Medicare Other | Admitting: Internal Medicine

## 2020-11-21 ENCOUNTER — Other Ambulatory Visit: Payer: Self-pay

## 2020-11-21 ENCOUNTER — Encounter: Payer: Self-pay | Admitting: Internal Medicine

## 2020-11-21 VITALS — BP 120/58 | HR 62 | Ht 72.0 in | Wt 156.2 lb

## 2020-11-21 DIAGNOSIS — I700 Atherosclerosis of aorta: Secondary | ICD-10-CM

## 2020-11-21 DIAGNOSIS — R002 Palpitations: Secondary | ICD-10-CM | POA: Diagnosis not present

## 2020-11-21 DIAGNOSIS — R079 Chest pain, unspecified: Secondary | ICD-10-CM | POA: Diagnosis not present

## 2020-11-21 DIAGNOSIS — R0989 Other specified symptoms and signs involving the circulatory and respiratory systems: Secondary | ICD-10-CM | POA: Diagnosis not present

## 2020-11-21 MED ORDER — LOSARTAN POTASSIUM 25 MG PO TABS
25.0000 mg | ORAL_TABLET | Freq: Every evening | ORAL | 1 refills | Status: DC
Start: 2020-11-21 — End: 2020-12-31

## 2020-11-21 NOTE — Progress Notes (Signed)
Outpatient Visit Date: 11/21/2020  Referring Provider: Jerrol Banana., MD [address] [place],  [place] [address]  Chief Complaint: Elevated blood pressure in the mornings  HPI:  Daniel Owens is a 75 y.o. male who is being seen today for the evaluation of elevated blood pressure. He has a history of hypertension, mild carotid artery disease, palpitations, and systolic heart murmur.  He was last seen in our practice in 07/2017 for follow-up of abnormal stress test in the setting of work-up of chest discomfort.  Subsequent cardiac CTA was normal without CAD.  Today, [name] reports that he has been feeling fairly well.  About a month ago, he started to experience a "pounding sensation in his chest when he would awaken in the morning.  This prompted him to check his blood pressure which was elevated with systolic readings up to 606 mmHg.  He would then take his daily metoprolol and losartan doses with normalization of his blood pressure within 1 to 2 hours.  He notes occasional skipped beats but otherwise no other palpitations.  He denies chest pain, lightheadedness, shortness of breath, and edema.  He notes some chronic back pain and wonders if the oxycodone that he uses this could be affecting his heart rate and blood pressure.  --------------------------------------------------------------------------------------------------  Past Medical History:  Diagnosis Date  . Abnormal stress test    a. 07/2017 ETT: Ex 10:15, no chest pain, HTN response, 52mm ST dep in II, II, aVF, V4-V6 - returned to baseline in 5 mins-->Intermediate risk study. b. Cardiac CTA normal without CAD (08/2017)  . Aortic atherosclerosis ([place])    a. Incidentally noted on Chest CT in 2016.  . Carotid arterial disease ([place])    a. 07/2014 carotid u/s: mild <50%, bilat ICA stenosis.  . Hyperlipidemia   . Hypertension   . Macrocytic anemia   . osteoarthritis   . Palpitations    a. ? prior dx of PAF.  Marland Kitchen  Raynauds syndrome   . Systolic murmur    a. Reports h/o rheumatic fever as a child;  b. 07/2017 Echo: EF 55-60%, no rwma, triv AI, nl RV fxn.    Past Surgical History:  Procedure Laterality Date  . APPENDECTOMY    . BILATERAL KNEE ARTHROSCOPY    . HERNIA REPAIR    . NECK SURGERY     involving disc  . NOSE SURGERY    . RETINAL DETACHMENT SURGERY    . SHOULDER SURGERY     Right-x 3 overall  . TENDON REPAIR     involving Tricep tendon    Current Meds  Medication Sig  . Ascorbic Acid (VITAMIN C) 1000 MG tablet Take 1,000 mg by mouth daily.  . B Complex Vitamins (VITAMIN B COMPLEX PO) Take 1 tablet by mouth daily.  . beta carotene 25000 UNIT capsule Take 25,000 Units by mouth daily.  . celecoxib (CELEBREX) 200 MG capsule TAKE 1 CAPSULE BY MOUTH EVERY DAY  . fish oil-omega-3 fatty acids 1000 MG capsule Take 1 g by mouth daily.  . folic acid (FOLVITE) 301 MCG tablet Take 1 tablet (400 mcg total) by mouth daily.  Marland Kitchen GLUCOSAMINE-CHONDROIT-VIT C-MN PO Take by mouth daily.   Marland Kitchen losartan (COZAAR) 25 MG tablet TAKE 1 TABLET BY MOUTH DAILY  . metoprolol succinate (TOPROL-XL) 25 MG 24 hr tablet Take 25 mg by mouth daily.  . Multiple Vitamin (MULTIVITAMIN WITH MINERALS) TABS tablet Take 1 tablet by mouth daily.  Marland Kitchen omeprazole (PRILOSEC) 20 MG  capsule Take 1 capsule (20 mg total) by mouth daily.  Marland Kitchen oxyCODONE (ROXICODONE) 15 MG immediate release tablet Take 1 tablet (15 mg total) by mouth every 4 (four) hours as needed for pain.  . rosuvastatin (CRESTOR) 5 MG tablet TAKE 1 TABLET(5 MG) BY MOUTH DAILY  . Selenium 200 MCG TABS Take 200 mcg by mouth daily.  . Zinc 50 MG CAPS Take by mouth.    Allergies: Patient has no known allergies.  Social History   Tobacco Use  . Smoking status: Former Smoker    Types: Cigarettes    Quit date: 10/19/1977    Years since quitting: 43.1  . Smokeless tobacco: Current User    Types: Chew  . Tobacco comment: trying to quit  Vaping Use  . Vaping Use: Never  used  Substance Use Topics  . Alcohol use: Yes    Alcohol/week: 0.0 standard drinks    Comment: very seldom- wine  . Drug use: No    Family History  Problem Relation Age of Onset  . Stroke Mother   . Uterine cancer Mother   . Throat cancer Father   . Allergic rhinitis Brother   . Prostate cancer Maternal Uncle   . Prostate cancer Paternal Uncle   . Heart attack Maternal Grandmother   . Heart attack Maternal Grandfather     Review of Systems: A 12-system review of systems was performed and was negative except as noted in the HPI.  --------------------------------------------------------------------------------------------------  Physical Exam: BP (!) 120/58 (BP Location: Right Arm, Patient Position: Sitting, Cuff Size: Normal)   Pulse 62   Ht 6' (1.829 m)   Wt 156 lb 4 oz (70.9 kg)   SpO2 98%   BMI 21.19 kg/m   General: NAD. HEENT: No conjunctival pallor or scleral icterus. Facemask in place. Neck: Supple without lymphadenopathy, thyromegaly, JVD, or HJR. No carotid bruit. Lungs: Normal work of breathing. Clear to auscultation bilaterally without wheezes or crackles. Heart: Regular rate and rhythm without murmurs, rubs, or gallops. Non-displaced PMI. Abd: Bowel sounds present. Soft, NT/ND without hepatosplenomegaly Ext: No lower extremity edema. Radial, PT, and DP pulses are 2+ bilaterally Skin: Neuro: CNIII-XII intact. Strength and fine-touch sensation intact in upper and lower extremities bilaterally. Psych: Normal mood and affect.  EKG: Normal sinus rhythm without abnormality.  Lab Results  Component Value Date   WBC 8.1 09/03/2020   HGB 12.5 (L) 09/03/2020   HCT 36.9 (L) 09/03/2020   MCV 104.8 (H) 09/03/2020   PLT 185 09/03/2020    Lab Results  Component Value Date   NA 134 (L) 09/03/2020   K 4.3 09/03/2020   CL 97 (L) 09/03/2020   CO2 28 09/03/2020   BUN 27 (H) 09/03/2020   CREATININE 1.22 09/03/2020   GLUCOSE 102 (H) 09/03/2020   ALT 14 06/19/2020     Lab Results  Component Value Date   CHOL 167 06/19/2020   HDL 84 06/19/2020   LDLCALC 67 06/19/2020   TRIG 87 06/19/2020   CHOLHDL 2.0 06/19/2020     --------------------------------------------------------------------------------------------------  ASSESSMENT AND PLAN: Labile hypertension: Blood pressure generally has been well controlled, though Mr. Bickford notes some elevated readings in the mornings when he first gets up.  Blood pressure today is fairly well controlled with slightly low DBP.  Rather than changing his medications, I have suggested that he continue to take metoprolol succinate in the morning and try taking losartan in the evening at currently prescribed doses.  If he continues to have labile blood  pressures, will need to consider further adjustments to his regimen.  Importance of sodium restriction was reinforced.  Palpitations: Mr. Worlds is more aware of his heartbeat when he first wakes up in the morning and also notes occasional skipped beats.  There are no worrisome associated symptoms such as lightheadedness and chest pain.  EKG today shows normal sinus rhythm.  We have agreed to defer event monitoring.  However, if symptoms worsen, Mr. Yi should reach out to [place] so that we can arrange for ambulatory monitor placement.  Chest pain: No further episodes since our last visit in 2018.  Coronary CTA at that time was normal.  Continue primary prevention.  Aortic atherosclerosis: Incidentally noted on prior cross-sectional imaging of the chest.  Continue rosuvastatin to prevent progression of disease.  LDL is at goal.  I think it is reasonable to defer aspirin at this time given the lack of CAD and concern for bleeding/anemia.  Follow-up: Return to clinic in 2-3 months.  Nelva Bush, MD 11/21/2020 12:06 PM

## 2020-11-21 NOTE — Patient Instructions (Signed)
Medication Instructions:  Your physician has recommended you make the following change in your medication:  1- CHANGE Losartan to daily in the evening.  *If you need a refill on your cardiac medications before your next appointment, please call your pharmacy*  Follow-Up: At [facility], you and your health needs are our priority.  As part of our continuing mission to provide you with exceptional heart care, we have created designated Provider Care Teams.  These Care Teams include your primary Cardiologist (physician) and Advanced Practice Providers (APPs -  Physician Assistants and Nurse Practitioners) who all work together to provide you with the care you need, when you need it.  We recommend signing up for the patient portal called "MyChart".  Sign up information is provided on this After Visit Summary.  MyChart is used to connect with patients for Virtual Visits (Telemedicine).  Patients are able to view lab/test results, encounter notes, upcoming appointments, etc.  Non-urgent messages can be sent to your provider as well.   To learn more about what you can do with MyChart, go to [email or web address].    Your next appointment:   2-3 month(s)  The format for your next appointment:   In Person  Provider:   You may see DR Harrell Gave END or one of the following Advanced Practice Providers on your designated Care Team:    Murray Hodgkins, NP  Christell Faith, PA-C  Marrianne Mood, PA-C  Cadence [place], [place]  Laurann Montana, NP

## 2020-11-28 DIAGNOSIS — M170 Bilateral primary osteoarthritis of knee: Secondary | ICD-10-CM | POA: Insufficient documentation

## 2020-11-28 DIAGNOSIS — M25461 Effusion, right knee: Secondary | ICD-10-CM | POA: Diagnosis not present

## 2020-12-12 NOTE — Progress Notes (Signed)
Established patient visit   Patient: Daniel Owens   DOB: 1946/03/20   75 y.o. Male  MRN: [id number] Visit Date: 12/13/2020  Today's healthcare provider: [name], MD   Chief Complaint  Patient presents with  . Hypertension  . Hyperlipidemia  . Pain Management   Subjective    HPI  he complains of chronic pain.  Takes one of his blood pressure medicines in the morning 1 at night. Hypertension, follow-up  BP Readings from Last 3 Encounters:  12/13/20 (!) 133/51  11/21/20 (!) 120/58  09/18/20 (!) 122/54   Wt Readings from Last 3 Encounters:  12/13/20 154 lb (69.9 kg)  11/21/20 156 lb 4 oz (70.9 kg)  09/18/20 161 lb (73 kg)     He was last seen for hypertension 3 months ago.  BP at that visit was 122/54. Management since that visit includes no medication changes. He reports good compliance with treatment. He is having side effects. Patient reports that he has heart fluttering occasionally in the mornings. He reports that he saw his cardiologist earlier this month about his symptoms, and it was recommended that he take Metoprolol in the morning and Losartan in the evenings. He reports that he has not noticed much change in his symptoms.  He is not exercising. He is adherent to low salt diet.   Outside blood pressures are checked daily. Patient reports that he has averaged in the 150s/70s.   He does not smoke.  Use of agents associated with hypertension: none.   Lipid/Cholesterol, follow-up  Last Lipid Panel: Lab Results  Component Value Date   CHOL 167 06/19/2020   LDLCALC 67 06/19/2020   HDL 84 06/19/2020   TRIG 87 06/19/2020    He was last seen for this 3 months ago.  Management since that visit includes no medication changes.  He reports good compliance with treatment. He is not having side effects.   Symptoms: No appetite changes No foot ulcerations  No chest pain No chest pressure/discomfort  No dyspnea No orthopnea  No fatigue No lower  extremity edema  Yes palpitations No paroxysmal nocturnal dyspnea  No nausea No numbness or tingling of extremity  No polydipsia No polyuria  No speech difficulty No syncope   He is following a Regular diet. Current exercise: walking  Last metabolic panel Lab Results  Component Value Date   GLUCOSE 102 (H) 09/03/2020   NA 134 (L) 09/03/2020   K 4.3 09/03/2020   BUN 27 (H) 09/03/2020   CREATININE 1.22 09/03/2020   GFRNONAA >60 09/03/2020   GFRAA 82 06/19/2020   CALCIUM 9.2 09/03/2020   AST 19 06/19/2020   ALT 14 06/19/2020   The 10-year ASCVD risk score Mikey Bussing DC Jr., et al., 2013) is: 20.9%  Other chronic pain From 09/18/2020- Would not increase dose of narcotic.  Urine drug screen on next visit.     Medications: Outpatient Medications Prior to Visit  Medication Sig  . Ascorbic Acid (VITAMIN C) 1000 MG tablet Take 1,000 mg by mouth daily.  . B Complex Vitamins (VITAMIN B COMPLEX PO) Take 1 tablet by mouth daily.  . beta carotene 25000 UNIT capsule Take 25,000 Units by mouth daily.  . celecoxib (CELEBREX) 200 MG capsule TAKE 1 CAPSULE BY MOUTH EVERY DAY  . fish oil-omega-3 fatty acids 1000 MG capsule Take 1 g by mouth daily.  . folic acid (FOLVITE) 631 MCG tablet Take 1 tablet (400 mcg total) by mouth daily.  Marland Kitchen  GLUCOSAMINE-CHONDROIT-VIT C-MN PO Take by mouth daily.   Marland Kitchen losartan (COZAAR) 25 MG tablet Take 1 tablet (25 mg total) by mouth every evening.  . metoprolol succinate (TOPROL-XL) 25 MG 24 hr tablet Take 25 mg by mouth daily.  . Multiple Vitamin (MULTIVITAMIN WITH MINERALS) TABS tablet Take 1 tablet by mouth daily.  Marland Kitchen omeprazole (PRILOSEC) 20 MG capsule Take 1 capsule (20 mg total) by mouth daily.  Marland Kitchen oxyCODONE (ROXICODONE) 15 MG immediate release tablet Take 1 tablet (15 mg total) by mouth every 4 (four) hours as needed for pain.  . rosuvastatin (CRESTOR) 5 MG tablet TAKE 1 TABLET(5 MG) BY MOUTH DAILY  . Selenium 200 MCG TABS Take 200 mcg by mouth daily.  . Zinc 50  MG CAPS Take by mouth.  . oxyCODONE (ROXICODONE) 15 MG immediate release tablet Take 1 tablet (15 mg total) by mouth every 6 (six) hours as needed for pain. (Patient not taking: Reported on 11/21/2020)  . oxyCODONE (ROXICODONE) 15 MG immediate release tablet Take 1 tablet (15 mg total) by mouth every 4 (four) hours as needed for pain. (Patient not taking: Reported on 11/21/2020)   No facility-administered medications prior to visit.    Review of Systems  Constitutional: Negative.   Respiratory: Negative.   Cardiovascular: Positive for palpitations. Negative for chest pain and leg swelling.  Musculoskeletal: Negative.   Neurological: Negative.        Objective    BP (!) 133/51   Pulse 61   Temp 98.6 F (37 C)   Resp 16   Wt 154 lb (69.9 kg)   BMI 20.89 kg/m     Physical Exam Vitals reviewed.  Constitutional:      Appearance: Normal appearance.  HENT:     Head: Normocephalic and atraumatic.     Right Ear: External ear normal.     Left Ear: External ear normal.  Eyes:     General: No scleral icterus.    Conjunctiva/sclera: Conjunctivae normal.  Cardiovascular:     Rate and Rhythm: Normal rate and regular rhythm.     Pulses: Normal pulses.     Heart sounds: Normal heart sounds.     Comments: Pulses good. Large varicose veins. Chronic skin changes due to venous stasis. Pulmonary:     Effort: Pulmonary effort is normal.     Breath sounds: Normal breath sounds.  Musculoskeletal:     Right lower leg: No edema.     Left lower leg: No edema.  Skin:    General: Skin is warm and dry.     Capillary Refill: Capillary refill takes 2 to 3 seconds.  Neurological:     General: No focal deficit present.     Mental Status: He is alert and oriented to person, place, and time.  Psychiatric:        Mood and Affect: Mood normal.        Behavior: Behavior normal.        Thought Content: Thought content normal.        Judgment: Judgment normal.       No results found for any visits  on 12/13/20.  Assessment & Plan     1. Essential hypertension Type most blood pressure medications at night.  2. Other chronic pain Meds.  Urine drug screen done. - [facility] (14 Drugs), Ur - oxyCODONE (ROXICODONE) 15 MG immediate release tablet; Take 1 tablet (15 mg total) by mouth every 4 (four) hours as needed for pain.  Dispense: 120  tablet; Refill: 0 - oxyCODONE (ROXICODONE) 15 MG immediate release tablet; Take 1 tablet (15 mg total) by mouth every 6 (six) hours as needed for pain.  Dispense: 120 tablet; Refill: 0 - oxyCODONE (ROXICODONE) 15 MG immediate release tablet; Take 1 tablet (15 mg total) by mouth every 4 (four) hours as needed for pain.  Dispense: 120 tablet; Refill: 0   No follow-ups on file.      I, [name], MD, have reviewed all documentation for this visit. The documentation on 12/16/20 for the exam, diagnosis, procedures, and orders are all accurate and complete.    Richard Cranford Mon, MD  [facility] [phone] (phone) [phone] (fax)  [place]

## 2020-12-13 ENCOUNTER — Ambulatory Visit (INDEPENDENT_AMBULATORY_CARE_PROVIDER_SITE_OTHER): Payer: Medicare Other | Admitting: Family Medicine

## 2020-12-13 ENCOUNTER — Other Ambulatory Visit: Payer: Self-pay

## 2020-12-13 ENCOUNTER — Encounter: Payer: Self-pay | Admitting: Family Medicine

## 2020-12-13 VITALS — BP 133/51 | HR 61 | Temp 98.6°F | Resp 16 | Wt 154.0 lb

## 2020-12-13 DIAGNOSIS — I10 Essential (primary) hypertension: Secondary | ICD-10-CM

## 2020-12-13 DIAGNOSIS — G8929 Other chronic pain: Secondary | ICD-10-CM | POA: Diagnosis not present

## 2020-12-13 MED ORDER — OXYCODONE HCL 15 MG PO TABS: 15.0000 mg | ORAL_TABLET | ORAL | 0 refills | Status: DC | PRN

## 2020-12-13 MED ORDER — OXYCODONE HCL 15 MG PO TABS: 15.0000 mg | ORAL_TABLET | Freq: Four times a day (QID) | ORAL | 0 refills | Status: DC | PRN

## 2020-12-13 NOTE — Patient Instructions (Signed)
Take both BP meds at bedtime.

## 2020-12-14 LAB — PAIN MGT SCRN (14 DRUGS), UR
Amphetamine Scrn, Ur: POSITIVE ng/mL — AB
BARBITURATE SCREEN URINE: NEGATIVE ng/mL
BENZODIAZEPINE SCREEN, URINE: NEGATIVE ng/mL
Buprenorphine, Urine: NEGATIVE ng/mL
CANNABINOIDS UR QL SCN: NEGATIVE ng/mL
Cocaine (Metab) Scrn, Ur: NEGATIVE ng/mL
Creatinine(Crt), U: 112.1 mg/dL (ref 20.0–300.0)
Fentanyl, Urine: NEGATIVE pg/mL
Meperidine Screen, Urine: NEGATIVE ng/mL
Methadone Screen, Urine: NEGATIVE ng/mL
OXYCODONE+OXYMORPHONE UR QL SCN: POSITIVE ng/mL — AB
Opiate Scrn, Ur: NEGATIVE ng/mL
Ph of Urine: 5.8 (ref 4.5–8.9)
Phencyclidine Qn, Ur: NEGATIVE ng/mL
Propoxyphene Scrn, Ur: NEGATIVE ng/mL
Tramadol Screen, Urine: NEGATIVE ng/mL

## 2020-12-18 ENCOUNTER — Other Ambulatory Visit: Payer: Self-pay | Admitting: Family Medicine

## 2020-12-18 DIAGNOSIS — I700 Atherosclerosis of aorta: Secondary | ICD-10-CM

## 2020-12-31 ENCOUNTER — Telehealth: Payer: Self-pay | Admitting: Internal Medicine

## 2020-12-31 DIAGNOSIS — R0989 Other specified symptoms and signs involving the circulatory and respiratory systems: Secondary | ICD-10-CM

## 2020-12-31 MED ORDER — LOSARTAN POTASSIUM 50 MG PO TABS: 50.0000 mg | ORAL_TABLET | Freq: Every day | ORAL | 1 refills | Status: DC

## 2020-12-31 NOTE — Telephone Encounter (Signed)
Please have Mr. Wheeling increase losartan to 50 mg daily with BMP in ~2 weeks.  Thanks.  Nelva Bush, MD [facility]

## 2020-12-31 NOTE — Telephone Encounter (Signed)
Patient agreeable to increase losartan to 50 mg once a day and get lab work in 2 weeks at [facility]. Med list updated.

## 2020-12-31 NOTE — Telephone Encounter (Signed)
Pt c/o BP issue: STAT if pt c/o blurred vision, one-sided weakness or slurred speech  1. What are your last 5 BP readings?   Took meds in am and pm no change now taking both metoprolol and losartan together at night per pcp advise     3/7  152/57  56 3/8  145/70  53 3/9  143/73  53 3/10  157/69  55  3/11  141/73  55 3/13  153/67  53 3/14  151/72  55  2. Are you having any other symptoms (ex. Dizziness, headache, blurred vision, passed out)?   Interim headaches   3. What is your BP issue?  Patient has not been able to see a reduction in bp since last visit with MD   Please advise patient concerned about family hx cva and systolic numbers

## 2021-01-01 DIAGNOSIS — M79672 Pain in left foot: Secondary | ICD-10-CM | POA: Diagnosis not present

## 2021-01-01 DIAGNOSIS — G5793 Unspecified mononeuropathy of bilateral lower limbs: Secondary | ICD-10-CM | POA: Diagnosis not present

## 2021-01-01 DIAGNOSIS — D2372 Other benign neoplasm of skin of left lower limb, including hip: Secondary | ICD-10-CM | POA: Diagnosis not present

## 2021-01-14 ENCOUNTER — Other Ambulatory Visit
Admission: RE | Admit: 2021-01-14 | Discharge: 2021-01-14 | Disposition: A | Discharge status: Home / Self Care | Payer: Medicare Other | Attending: Physician Assistant | Admitting: Physician Assistant

## 2021-01-14 DIAGNOSIS — R0989 Other specified symptoms and signs involving the circulatory and respiratory systems: Secondary | ICD-10-CM | POA: Insufficient documentation

## 2021-01-14 LAB — BASIC METABOLIC PANEL
Anion gap: 8 (ref 5–15)
BUN: 23 mg/dL (ref 8–23)
CO2: 28 mmol/L (ref 22–32)
Calcium: 9.3 mg/dL (ref 8.9–10.3)
Chloride: 101 mmol/L (ref 98–111)
Creatinine, Ser: 1.08 mg/dL (ref 0.61–1.24)
GFR, Estimated: >60 mL/min (ref >60)
Glucose, Bld: 96 mg/dL (ref 70–99)
Potassium: 3.7 mmol/L (ref 3.5–5.1)
Sodium: 137 mmol/L (ref 135–145)

## 2021-01-18 ENCOUNTER — Telehealth: Payer: Self-pay | Admitting: Family Medicine

## 2021-01-18 DIAGNOSIS — Z03818 Encounter for observation for suspected exposure to other biological agents ruled out: Secondary | ICD-10-CM | POA: Diagnosis not present

## 2021-01-18 DIAGNOSIS — Z209 Contact with and (suspected) exposure to unspecified communicable disease: Secondary | ICD-10-CM | POA: Diagnosis not present

## 2021-01-18 DIAGNOSIS — B9689 Other specified bacterial agents as the cause of diseases classified elsewhere: Secondary | ICD-10-CM | POA: Diagnosis not present

## 2021-01-18 DIAGNOSIS — J019 Acute sinusitis, unspecified: Secondary | ICD-10-CM | POA: Diagnosis not present

## 2021-01-18 DIAGNOSIS — J209 Acute bronchitis, unspecified: Secondary | ICD-10-CM | POA: Diagnosis not present

## 2021-01-18 NOTE — Telephone Encounter (Signed)
Pt is calling to see if Dr. [name] could see him in the office- for a sore throat. Pt denies wanting a virtual appt for this. Pt denies fever, chills, cough, sob, muscle pain. Pt states that he has already had COVID - and Dr. [name] placed an order for the pt to have antibodies. Pt is requesting a call back today. Cb- [phone]

## 2021-01-18 NOTE — Telephone Encounter (Signed)
Dr. [name] is out of the office. I called and offered patient a virtual visit with Dr. Caryn Section today at 1pm. Patient declined and advised me that he is at an urgent care right now being seen.

## 2021-02-12 DIAGNOSIS — M5416 Radiculopathy, lumbar region: Secondary | ICD-10-CM | POA: Diagnosis not present

## 2021-02-12 DIAGNOSIS — M5117 Intervertebral disc disorders with radiculopathy, lumbosacral region: Secondary | ICD-10-CM | POA: Diagnosis not present

## 2021-02-14 DIAGNOSIS — Z862 Personal history of diseases of the blood and blood-forming organs and certain disorders involving the immune mechanism: Secondary | ICD-10-CM | POA: Diagnosis not present

## 2021-02-20 ENCOUNTER — Other Ambulatory Visit: Payer: Self-pay

## 2021-02-20 ENCOUNTER — Encounter: Payer: Self-pay | Admitting: Internal Medicine

## 2021-02-20 ENCOUNTER — Ambulatory Visit (INDEPENDENT_AMBULATORY_CARE_PROVIDER_SITE_OTHER): Payer: Medicare Other | Admitting: Cardiology

## 2021-02-20 VITALS — BP 120/50 | HR 60 | Ht 72.0 in | Wt 152.0 lb

## 2021-02-20 DIAGNOSIS — I10 Essential (primary) hypertension: Secondary | ICD-10-CM

## 2021-02-20 DIAGNOSIS — R002 Palpitations: Secondary | ICD-10-CM | POA: Diagnosis not present

## 2021-02-20 NOTE — Progress Notes (Signed)
Cardiology Office Note:    Date:  02/20/2021   ID:  Daniel Owens, DOB May 21, 1946, MRN [id number]  PCP:  Jerrol Banana., MD   [facility] Providers Cardiologist:  Nelva Bush, MD     Referring MD: Jerrol Banana.,*   Chief Complaint  Patient presents with  . Other    2-3 month follow up - Patient c.o BP being elevated in the AM and settles out in the PM. Meds reviewed verbally with patient.     History of Present Illness:    Daniel Owens is a 75 y.o. male with a hx of hypertension, mild coronary artery disease, systolic murmur presenting for follow-up.  Patient being seen due to elevated blood pressures.  Previously seen in February to establish care with Dr. Saunders Revel.  Had complaints of palpitations, and elevated BP.  He was advised to take Toprol-XL in the a.m.  Losartan was increased to 50 mg daily to help with BP control.  His blood pressure has improved since taking losartan.  Systolics in the morning is usually 130s to 140s, but normalizes around afternoon and stays normal in the evenings.  Denies any dizziness.  Rarely has palpitations, occasional skipped heartbeats.  Past Medical History:  Diagnosis Date  . Abnormal stress test    a. 07/2017 ETT: Ex 10:15, no chest pain, HTN response, 28mm ST dep in II, II, aVF, V4-V6 - returned to baseline in 5 mins-->Intermediate risk study. b. Cardiac CTA normal without CAD (08/2017)  . Aortic atherosclerosis ([place])    a. Incidentally noted on Chest CT in 2016.  . Carotid arterial disease ([place])    a. 07/2014 carotid u/s: mild <50%, bilat ICA stenosis.  . Hyperlipidemia   . Hypertension   . Macrocytic anemia   . osteoarthritis   . Palpitations    a. ? prior dx of PAF.  Marland Kitchen Raynauds syndrome   . Systolic murmur    a. Reports h/o rheumatic fever as a child;  b. 07/2017 Echo: EF 55-60%, no rwma, triv AI, nl RV fxn.    Past Surgical History:  Procedure Laterality Date  . APPENDECTOMY    . BILATERAL KNEE ARTHROSCOPY     . HERNIA REPAIR    . NECK SURGERY     involving disc  . NOSE SURGERY    . RETINAL DETACHMENT SURGERY    . SHOULDER SURGERY     Right-x 3 overall  . TENDON REPAIR     involving Tricep tendon    Current Medications: Current Meds  Medication Sig  . Ascorbic Acid (VITAMIN C) 1000 MG tablet Take 1,000 mg by mouth daily.  . B Complex Vitamins (VITAMIN B COMPLEX PO) Take 1 tablet by mouth daily.  . beta carotene 25000 UNIT capsule Take 25,000 Units by mouth daily.  . celecoxib (CELEBREX) 200 MG capsule TAKE 1 CAPSULE BY MOUTH EVERY DAY  . fish oil-omega-3 fatty acids 1000 MG capsule Take 1 g by mouth daily.  . folic acid (FOLVITE) 725 MCG tablet Take 1 tablet (400 mcg total) by mouth daily.  Marland Kitchen GLUCOSAMINE-CHONDROIT-VIT C-MN PO Take by mouth daily.   Marland Kitchen losartan (COZAAR) 50 MG tablet Take 1 tablet (50 mg total) by mouth daily.  . metoprolol succinate (TOPROL-XL) 25 MG 24 hr tablet Take 25 mg by mouth daily.  . Multiple Vitamin (MULTIVITAMIN WITH MINERALS) TABS tablet Take 1 tablet by mouth daily.  Marland Kitchen omeprazole (PRILOSEC) 20 MG capsule Take 1 capsule (20 mg total) by mouth  daily.  . oxyCODONE (ROXICODONE) 15 MG immediate release tablet Take 1 tablet (15 mg total) by mouth every 4 (four) hours as needed for pain.  Marland Kitchen oxyCODONE (ROXICODONE) 15 MG immediate release tablet Take 1 tablet (15 mg total) by mouth every 6 (six) hours as needed for pain.  Marland Kitchen oxyCODONE (ROXICODONE) 15 MG immediate release tablet Take 1 tablet (15 mg total) by mouth every 4 (four) hours as needed for pain.  . rosuvastatin (CRESTOR) 5 MG tablet TAKE 1 TABLET(5 MG) BY MOUTH DAILY  . Selenium 200 MCG TABS Take 200 mcg by mouth daily.  . Zinc 50 MG CAPS Take by mouth.     Allergies:   Patient has no known allergies.   Social History   Socioeconomic History  . Marital status: Married    Spouse name: Not on file  . Number of children: 2  . Years of education: Not on file  . Highest education level: Associate degree:  academic program  Occupational History  . Occupation: retired  Tobacco Use  . Smoking status: Former Smoker    Types: Cigarettes    Quit date: 10/19/1977    Years since quitting: 43.3  . Smokeless tobacco: Current User    Types: Chew  . Tobacco comment: trying to quit  Vaping Use  . Vaping Use: Never used  Substance and Sexual Activity  . Alcohol use: Yes    Alcohol/week: 0.0 standard drinks    Comment: very seldom- wine  . Drug use: No  . Sexual activity: Not on file  Other Topics Concern  . Not on file  Social History Narrative  . Not on file   Social Determinants of Health   Financial Resource Strain: Low Risk   . Difficulty of Paying Living Expenses: Not hard at all  Food Insecurity: No Food Insecurity  . Worried About Charity fundraiser in the Last Year: Never true  . Ran Out of Food in the Last Year: Never true  Transportation Needs: No Transportation Needs  . Lack of Transportation (Medical): No  . Lack of Transportation (Non-Medical): No  Physical Activity: Inactive  . Days of Exercise per Week: 0 days  . Minutes of Exercise per Session: 0 min  Stress: No Stress Concern Present  . Feeling of Stress : Not at all  Social Connections: Socially Integrated  . Frequency of Communication with Friends and Family: More than three times a week  . Frequency of Social Gatherings with Friends and Family: More than three times a week  . Attends Religious Services: More than 4 times per year  . Active Member of Clubs or Organizations: Yes  . Attends Archivist Meetings: Never  . Marital Status: Married     Family History: The patient's family history includes Allergic rhinitis in his brother; Heart attack in his maternal grandfather and maternal grandmother; Prostate cancer in his maternal uncle and paternal uncle; Stroke in his mother; Throat cancer in his father; Uterine cancer in his mother.  ROS:   Please see the history of present illness.     All other  systems reviewed and are negative.  EKGs/Labs/Other Studies Reviewed:    The following studies were reviewed today:   EKG:  EKG is  ordered today.  The ekg ordered today demonstrates normal sinus rhythm, normal ECG  Recent Labs: 06/19/2020: ALT 14; TSH 1.570 09/03/2020: Hemoglobin 12.5; Platelets 185 01/14/2021: BUN 23; Creatinine, Ser 1.08; Potassium 3.7; Sodium 137  Recent Lipid Panel  Component Value Date/Time   CHOL 167 06/19/2020 0901   TRIG 87 06/19/2020 0901   HDL 84 06/19/2020 0901   CHOLHDL 2.0 06/19/2020 0901   LDLCALC 67 06/19/2020 0901     Risk Assessment/Calculations:      Physical Exam:    VS:  BP (!) 120/50 (BP Location: Left Arm, Patient Position: Sitting, Cuff Size: Normal)   Pulse 60   Ht 6' (1.829 m)   Wt 152 lb (68.9 kg)   SpO2 97%   BMI 20.61 kg/m     Wt Readings from Last 3 Encounters:  02/20/21 152 lb (68.9 kg)  12/13/20 154 lb (69.9 kg)  11/21/20 156 lb 4 oz (70.9 kg)     GEN:  Well nourished, well developed in no acute distress HEENT: Normal NECK: No JVD; No carotid bruits LYMPHATICS: No lymphadenopathy CARDIAC: RRR, 2/6 systolic murmur RESPIRATORY:  Clear to auscultation without rales, wheezing or rhonchi  ABDOMEN: Soft, non-tender, non-distended MUSCULOSKELETAL:  No edema; No deformity  SKIN: Warm and dry NEUROLOGIC:  Alert and oriented x 3 PSYCHIATRIC:  Normal affect   ASSESSMENT:    1. Primary hypertension   2. Palpitations    PLAN:    In order of problems listed above:  1. History of hypertension, BP now controlled.  Continue Toprol-XL, losartan 50 mg daily.  No symptoms of dizziness noted. 2. Skipped heartbeats, palpitations, continue Toprol-XL.  Likely PVCs.  No dizziness, presyncope.  Follow-up in 3 months     Medication Adjustments/Labs and Tests Ordered: Current medicines are reviewed at length with the patient today.  Concerns regarding medicines are outlined above.  Orders Placed This Encounter  Procedures   . EKG 12-Lead   No orders of the defined types were placed in this encounter.   Patient Instructions  Medication Instructions:  Your physician recommends that you continue on your current medications as directed. Please refer to the Current Medication list given to you today.  *If you need a refill on your cardiac medications before your next appointment, please call your pharmacy*   Lab Work: None ordered If you have labs (blood work) drawn today and your tests are completely normal, you will receive your results only by: Marland Kitchen MyChart Message (if you have MyChart) OR . A paper copy in the mail If you have any lab test that is abnormal or we need to change your treatment, we will call you to review the results.   Testing/Procedures: None ordered   Follow-Up: At [facility], you and your health needs are our priority.  As part of our continuing mission to provide you with exceptional heart care, we have created designated Provider Care Teams.  These Care Teams include your primary Cardiologist (physician) and Advanced Practice Providers (APPs -  Physician Assistants and Nurse Practitioners) who all work together to provide you with the care you need, when you need it.  We recommend signing up for the patient portal called "MyChart".  Sign up information is provided on this After Visit Summary.  MyChart is used to connect with patients for Virtual Visits (Telemedicine).  Patients are able to view lab/test results, encounter notes, upcoming appointments, etc.  Non-urgent messages can be sent to your provider as well.   To learn more about what you can do with MyChart, go to [email or web address].    Your next appointment:   3 month(s)  The format for your next appointment:   In Person  Provider:   You may see Nelva Bush, MD or  one of the following Advanced Practice Providers on your designated Care Team:    Murray Hodgkins, NP  Christell Faith, PA-C  Marrianne Mood,  PA-C  Cadence Lemoyne, [place]  Laurann Montana, NP    Other Instructions N/A     Signed, Kate Sable, MD  02/20/2021 4:59 PM    [place]

## 2021-02-20 NOTE — Patient Instructions (Signed)
Medication Instructions:  Your physician recommends that you continue on your current medications as directed. Please refer to the Current Medication list given to you today.  *If you need a refill on your cardiac medications before your next appointment, please call your pharmacy*   Lab Work: None ordered If you have labs (blood work) drawn today and your tests are completely normal, you will receive your results only by: . MyChart Message (if you have MyChart) OR . A paper copy in the mail If you have any lab test that is abnormal or we need to change your treatment, we will call you to review the results.   Testing/Procedures: None ordered   Follow-Up: At [facility], you and your health needs are our priority.  As part of our continuing mission to provide you with exceptional heart care, we have created designated Provider Care Teams.  These Care Teams include your primary Cardiologist (physician) and Advanced Practice Providers (APPs -  Physician Assistants and Nurse Practitioners) who all work together to provide you with the care you need, when you need it.  We recommend signing up for the patient portal called "MyChart".  Sign up information is provided on this After Visit Summary.  MyChart is used to connect with patients for Virtual Visits (Telemedicine).  Patients are able to view lab/test results, encounter notes, upcoming appointments, etc.  Non-urgent messages can be sent to your provider as well.   To learn more about what you can do with MyChart, go to [email or web address].    Your next appointment:   3 month(s)  The format for your next appointment:   In Person  Provider:   You may see Christopher End, MD or one of the following Advanced Practice Providers on your designated Care Team:    Christopher Berge, NP  Ryan Dunn, PA-C  Jacquelyn Visser, PA-C  Cadence Furth, PA-C  Caitlin Walker, NP    Other Instructions N/A  

## 2021-02-20 NOTE — Progress Notes (Deleted)
Follow-up Outpatient Visit Date: 02/20/2021  Primary Care Provider: Jerrol Banana., MD [address] [place] [address]  Chief Complaint: ***  HPI:  Daniel Owens is a 75 y.o. male with history of hypertension, mild carotid artery disease, palpitations, and systolic heart murmur, who presents for follow-up of hypertension.  I last saw him in February, at which time he reestablished care with our practice.  At that time, he complained of a "pounding sensation in his chest that would wake him up in the morning.  He reported associated elevated systolic blood pressure readings.  I suggested that he try taking metoprolol succinate in the morning and losartan in the evenings to see if he would have more sustained blood pressure control.  Due to persistently elevated blood pressure readings at home, we subsequently agreed to increase losartan to 50 mg daily.  --------------------------------------------------------------------------------------------------  Past Medical History:  Diagnosis Date  . Abnormal stress test    a. 07/2017 ETT: Ex 10:15, no chest pain, HTN response, 5mm ST dep in II, II, aVF, V4-V6 - returned to baseline in 5 mins-->Intermediate risk study. b. Cardiac CTA normal without CAD (08/2017)  . Aortic atherosclerosis ([place])    a. Incidentally noted on Chest CT in 2016.  . Carotid arterial disease ([place])    a. 07/2014 carotid u/s: mild <50%, bilat ICA stenosis.  . Hyperlipidemia   . Hypertension   . Macrocytic anemia   . osteoarthritis   . Palpitations    a. ? prior dx of PAF.  Marland Kitchen Raynauds syndrome   . Systolic murmur    a. Reports h/o rheumatic fever as a child;  b. 07/2017 Echo: EF 55-60%, no rwma, triv AI, nl RV fxn.   Past Surgical History:  Procedure Laterality Date  . APPENDECTOMY    . BILATERAL KNEE ARTHROSCOPY    . HERNIA REPAIR    . NECK SURGERY     involving disc  . NOSE SURGERY    . RETINAL DETACHMENT SURGERY    . SHOULDER SURGERY      Right-x 3 overall  . TENDON REPAIR     involving Tricep tendon    Current Meds  Medication Sig  . Ascorbic Acid (VITAMIN C) 1000 MG tablet Take 1,000 mg by mouth daily.  . B Complex Vitamins (VITAMIN B COMPLEX PO) Take 1 tablet by mouth daily.  . beta carotene 25000 UNIT capsule Take 25,000 Units by mouth daily.  . celecoxib (CELEBREX) 200 MG capsule TAKE 1 CAPSULE BY MOUTH EVERY DAY  . fish oil-omega-3 fatty acids 1000 MG capsule Take 1 g by mouth daily.  . folic acid (FOLVITE) 604 MCG tablet Take 1 tablet (400 mcg total) by mouth daily.  Marland Kitchen GLUCOSAMINE-CHONDROIT-VIT C-MN PO Take by mouth daily.   Marland Kitchen losartan (COZAAR) 50 MG tablet Take 1 tablet (50 mg total) by mouth daily.  . metoprolol succinate (TOPROL-XL) 25 MG 24 hr tablet Take 25 mg by mouth daily.  . Multiple Vitamin (MULTIVITAMIN WITH MINERALS) TABS tablet Take 1 tablet by mouth daily.  Marland Kitchen omeprazole (PRILOSEC) 20 MG capsule Take 1 capsule (20 mg total) by mouth daily.  Marland Kitchen oxyCODONE (ROXICODONE) 15 MG immediate release tablet Take 1 tablet (15 mg total) by mouth every 4 (four) hours as needed for pain.  Marland Kitchen oxyCODONE (ROXICODONE) 15 MG immediate release tablet Take 1 tablet (15 mg total) by mouth every 6 (six) hours as needed for pain.  Marland Kitchen oxyCODONE (ROXICODONE) 15 MG immediate release tablet Take  1 tablet (15 mg total) by mouth every 4 (four) hours as needed for pain.  . rosuvastatin (CRESTOR) 5 MG tablet TAKE 1 TABLET(5 MG) BY MOUTH DAILY  . Selenium 200 MCG TABS Take 200 mcg by mouth daily.  . Zinc 50 MG CAPS Take by mouth.    Allergies: Patient has no known allergies.  Social History   Tobacco Use  . Smoking status: Former Smoker    Types: Cigarettes    Quit date: 10/19/1977    Years since quitting: 43.3  . Smokeless tobacco: Current User    Types: Chew  . Tobacco comment: trying to quit  Vaping Use  . Vaping Use: Never used  Substance Use Topics  . Alcohol use: Yes    Alcohol/week: 0.0 standard drinks    Comment:  very seldom- wine  . Drug use: No    Family History  Problem Relation Age of Onset  . Stroke Mother   . Uterine cancer Mother   . Throat cancer Father   . Allergic rhinitis Brother   . Prostate cancer Maternal Uncle   . Prostate cancer Paternal Uncle   . Heart attack Maternal Grandmother   . Heart attack Maternal Grandfather     Review of Systems: A 12-system review of systems was performed and was negative except as noted in the HPI.  --------------------------------------------------------------------------------------------------  Physical Exam: BP (!) 120/50 (BP Location: Left Arm, Patient Position: Sitting, Cuff Size: Normal)   Pulse 60   Ht 6' (1.829 m)   Wt 152 lb (68.9 kg)   SpO2 97%   BMI 20.61 kg/m   General:  NAD. Neck: No JVD or HJR. Lungs: Clear to auscultation bilaterally without wheezes or crackles. Heart: Regular rate and rhythm without murmurs, rubs, or gallops. Abdomen: Soft, nontender, nondistended. Extremities: No lower extremity edema.  EKG:  ***  Lab Results  Component Value Date   WBC 8.1 09/03/2020   HGB 12.5 (L) 09/03/2020   HCT 36.9 (L) 09/03/2020   MCV 104.8 (H) 09/03/2020   PLT 185 09/03/2020    Lab Results  Component Value Date   NA 137 01/14/2021   K 3.7 01/14/2021   CL 101 01/14/2021   CO2 28 01/14/2021   BUN 23 01/14/2021   CREATININE 1.08 01/14/2021   GLUCOSE 96 01/14/2021   ALT 14 06/19/2020    Lab Results  Component Value Date   CHOL 167 06/19/2020   HDL 84 06/19/2020   LDLCALC 67 06/19/2020   TRIG 87 06/19/2020   CHOLHDL 2.0 06/19/2020    --------------------------------------------------------------------------------------------------  ASSESSMENT AND PLAN: Harrell Gave Olive Zmuda, MD 02/20/2021 11:37 AM

## 2021-03-04 ENCOUNTER — Encounter (INDEPENDENT_AMBULATORY_CARE_PROVIDER_SITE_OTHER): Payer: Self-pay | Admitting: Nurse Practitioner

## 2021-03-04 ENCOUNTER — Ambulatory Visit (INDEPENDENT_AMBULATORY_CARE_PROVIDER_SITE_OTHER): Payer: Medicare Other | Admitting: Nurse Practitioner

## 2021-03-04 ENCOUNTER — Other Ambulatory Visit: Payer: Self-pay

## 2021-03-04 VITALS — BP 149/73 | HR 64 | Ht 72.0 in | Wt 152.0 lb

## 2021-03-04 DIAGNOSIS — I10 Essential (primary) hypertension: Secondary | ICD-10-CM | POA: Diagnosis not present

## 2021-03-04 DIAGNOSIS — I83819 Varicose veins of unspecified lower extremities with pain: Secondary | ICD-10-CM

## 2021-03-04 DIAGNOSIS — E7800 Pure hypercholesterolemia, unspecified: Secondary | ICD-10-CM | POA: Diagnosis not present

## 2021-03-06 DIAGNOSIS — X32XXXA Exposure to sunlight, initial encounter: Secondary | ICD-10-CM | POA: Diagnosis not present

## 2021-03-06 DIAGNOSIS — I872 Venous insufficiency (chronic) (peripheral): Secondary | ICD-10-CM | POA: Diagnosis not present

## 2021-03-06 DIAGNOSIS — D485 Neoplasm of uncertain behavior of skin: Secondary | ICD-10-CM | POA: Diagnosis not present

## 2021-03-06 DIAGNOSIS — L570 Actinic keratosis: Secondary | ICD-10-CM | POA: Diagnosis not present

## 2021-03-06 DIAGNOSIS — D0471 Carcinoma in situ of skin of right lower limb, including hip: Secondary | ICD-10-CM | POA: Diagnosis not present

## 2021-03-11 ENCOUNTER — Encounter (INDEPENDENT_AMBULATORY_CARE_PROVIDER_SITE_OTHER): Payer: Self-pay | Admitting: Nurse Practitioner

## 2021-03-11 NOTE — Progress Notes (Signed)
Subjective:    Patient ID: Daniel Owens, male    DOB: 1946-01-24, 75 y.o.   MRN: [id number] Chief Complaint  Patient presents with  . Follow-up    6 mo no studies    The patient returns today for follow-up evaluation of lower extremity edema and discoloration.  The patient still continues to have some discoloration from stasis dermatitis.  As far as the swelling is concerned that has improved.  The patient notes that there have been no wounds or ulcers.  The patient's blood work was ongoing.  He denies any significant issues.   Review of Systems  Cardiovascular: Negative for leg swelling.  All other systems reviewed and are negative.      Objective:   Physical Exam Vitals reviewed.  HENT:     Head: Normocephalic.  Cardiovascular:     Rate and Rhythm: Normal rate.     Pulses: Normal pulses.  Pulmonary:     Effort: Pulmonary effort is normal.  Musculoskeletal:     Right lower leg: No edema.     Left lower leg: No edema.  Neurological:     Mental Status: He is alert and oriented to person, place, and time.  Psychiatric:        Mood and Affect: Mood normal.        Behavior: Behavior normal.        Thought Content: Thought content normal.        Judgment: Judgment normal.     BP (!) 149/73   Pulse 64   Ht 6' (1.829 m)   Wt 152 lb (68.9 kg)   BMI 20.61 kg/m   Past Medical History:  Diagnosis Date  . Abnormal stress test    a. 07/2017 ETT: Ex 10:15, no chest pain, HTN response, 75mm ST dep in II, II, aVF, V4-V6 - returned to baseline in 5 mins-->Intermediate risk study. b. Cardiac CTA normal without CAD (08/2017)  . Aortic atherosclerosis ([place])    a. Incidentally noted on Chest CT in 2016.  . Carotid arterial disease ([place])    a. 07/2014 carotid u/s: mild <50%, bilat ICA stenosis.  . Hyperlipidemia   . Hypertension   . Macrocytic anemia   . osteoarthritis   . Palpitations    a. ? prior dx of PAF.  Marland Kitchen Raynauds syndrome   . Systolic murmur    a. Reports h/o  rheumatic fever as a child;  b. 07/2017 Echo: EF 55-60%, no rwma, triv AI, nl RV fxn.    Social History   Socioeconomic History  . Marital status: Married    Spouse name: Not on file  . Number of children: 2  . Years of education: Not on file  . Highest education level: Associate degree: academic program  Occupational History  . Occupation: retired  Tobacco Use  . Smoking status: Former Smoker    Types: Cigarettes    Quit date: 10/19/1977    Years since quitting: 43.4  . Smokeless tobacco: Current User    Types: Chew  . Tobacco comment: trying to quit  Vaping Use  . Vaping Use: Never used  Substance and Sexual Activity  . Alcohol use: Yes    Alcohol/week: 0.0 standard drinks    Comment: very seldom- wine  . Drug use: No  . Sexual activity: Not on file  Other Topics Concern  . Not on file  Social History Narrative  . Not on file   Social Determinants of Health   Financial Resource  Strain: Low Risk   . Difficulty of Paying Living Expenses: Not hard at all  Food Insecurity: No Food Insecurity  . Worried About Charity fundraiser in the Last Year: Never true  . Ran Out of Food in the Last Year: Never true  Transportation Needs: No Transportation Needs  . Lack of Transportation (Medical): No  . Lack of Transportation (Non-Medical): No  Physical Activity: Inactive  . Days of Exercise per Week: 0 days  . Minutes of Exercise per Session: 0 min  Stress: No Stress Concern Present  . Feeling of Stress : Not at all  Social Connections: Socially Integrated  . Frequency of Communication with Friends and Family: More than three times a week  . Frequency of Social Gatherings with Friends and Family: More than three times a week  . Attends Religious Services: More than 4 times per year  . Active Member of Clubs or Organizations: Yes  . Attends Archivist Meetings: Never  . Marital Status: Married  Human resources officer Violence: Not At Risk  . Fear of Current or  Ex-Partner: No  . Emotionally Abused: No  . Physically Abused: No  . Sexually Abused: No    Past Surgical History:  Procedure Laterality Date  . APPENDECTOMY    . BILATERAL KNEE ARTHROSCOPY    . HERNIA REPAIR    . NECK SURGERY     involving disc  . NOSE SURGERY    . RETINAL DETACHMENT SURGERY    . SHOULDER SURGERY     Right-x 3 overall  . TENDON REPAIR     involving Tricep tendon    Family History  Problem Relation Age of Onset  . Stroke Mother   . Uterine cancer Mother   . Throat cancer Father   . Allergic rhinitis Brother   . Prostate cancer Maternal Uncle   . Prostate cancer Paternal Uncle   . Heart attack Maternal Grandmother   . Heart attack Maternal Grandfather     No Known Allergies  CBC Latest Ref Rng & Units 09/03/2020 06/19/2020 08/29/2019  WBC 4.0 - 10.5 K/uL 8.1 4.8 5.3  Hemoglobin 13.0 - 17.0 g/dL 12.5(L) 11.9(L) 11.5(L)  Hematocrit 39.0 - 52.0 % 36.9(L) 35.4(L) 34.0(L)  Platelets 150 - 400 K/uL 185 175 178      CMP     Component Value Date/Time   NA 137 01/14/2021 0910   NA 137 06/19/2020 0901   K 3.7 01/14/2021 0910   CL 101 01/14/2021 0910   CO2 28 01/14/2021 0910   GLUCOSE 96 01/14/2021 0910   BUN 23 01/14/2021 0910   BUN 19 06/19/2020 0901   CREATININE 1.08 01/14/2021 0910   CALCIUM 9.3 01/14/2021 0910   PROT 6.3 06/19/2020 0901   ALBUMIN 4.3 06/19/2020 0901   AST 19 06/19/2020 0901   ALT 14 06/19/2020 0901   ALKPHOS 53 06/19/2020 0901   BILITOT 0.6 06/19/2020 0901   GFRNONAA >60 01/14/2021 0910   GFRAA 82 06/19/2020 0901     No results found.     Assessment & Plan:   1. Varicose veins with pain No surgery or intervention at this point in time.  I have reviewed my discussion with the patient regarding venous insufficiency and why it causes symptoms. I have discussed with the patient the chronic skin changes that accompany venous insufficiency and the long term sequela such as ulceration. Patient will contnue wearing graduated  compression stockings on a daily basis, as this has provided excellent control of  his edema. The patient will put the stockings on first thing in the morning and removing them in the evening. The patient is reminded not to sleep in the stockings.  In addition, behavioral modification including elevation during the day will be initiated. Exercise is strongly encouraged.   Given the patient's good control and lack of any problems regarding the venous insufficiency and lymphedema a lymph pump in not need at this time.  The patient will follow up with me PRN should anything change.  The patient voices agreement with this plan.   2. Essential hypertension Continue antihypertensive medications as already ordered, these medications have been reviewed and there are no changes at this time.   3. Pure hypercholesterolemia Continue statin as ordered and reviewed, no changes at this time    Current Outpatient Medications on File Prior to Visit  Medication Sig Dispense Refill  . Ascorbic Acid (VITAMIN C) 1000 MG tablet Take 1,000 mg by mouth daily.    . B Complex Vitamins (VITAMIN B COMPLEX PO) Take 1 tablet by mouth daily.    . beta carotene 25000 UNIT capsule Take 25,000 Units by mouth daily.    . celecoxib (CELEBREX) 200 MG capsule TAKE 1 CAPSULE BY MOUTH EVERY DAY 90 capsule 1  . fish oil-omega-3 fatty acids 1000 MG capsule Take 1 g by mouth daily.    . folic acid (FOLVITE) 737 MCG tablet Take 1 tablet (400 mcg total) by mouth daily. 30 tablet 0  . GLUCOSAMINE-CHONDROIT-VIT C-MN PO Take by mouth daily.     Marland Kitchen losartan (COZAAR) 50 MG tablet Take 1 tablet (50 mg total) by mouth daily. 90 tablet 1  . metoprolol succinate (TOPROL-XL) 25 MG 24 hr tablet Take 25 mg by mouth daily.    . Multiple Vitamin (MULTIVITAMIN WITH MINERALS) TABS tablet Take 1 tablet by mouth daily.    Marland Kitchen omeprazole (PRILOSEC) 20 MG capsule Take 1 capsule (20 mg total) by mouth daily. 90 capsule 4  . oxyCODONE (ROXICODONE) 15 MG  immediate release tablet Take 1 tablet (15 mg total) by mouth every 4 (four) hours as needed for pain. 120 tablet 0  . oxyCODONE (ROXICODONE) 15 MG immediate release tablet Take 1 tablet (15 mg total) by mouth every 6 (six) hours as needed for pain. 120 tablet 0  . oxyCODONE (ROXICODONE) 15 MG immediate release tablet Take 1 tablet (15 mg total) by mouth every 4 (four) hours as needed for pain. 120 tablet 0  . rosuvastatin (CRESTOR) 5 MG tablet TAKE 1 TABLET(5 MG) BY MOUTH DAILY 90 tablet 0  . Selenium 200 MCG TABS Take 200 mcg by mouth daily.    . Zinc 50 MG CAPS Take by mouth.     No current facility-administered medications on file prior to visit.    There are no Patient Instructions on file for this visit. No follow-ups on file.   Kris Hartmann, NP

## 2021-03-12 ENCOUNTER — Ambulatory Visit (INDEPENDENT_AMBULATORY_CARE_PROVIDER_SITE_OTHER): Payer: Medicare Other | Admitting: Family Medicine

## 2021-03-12 ENCOUNTER — Other Ambulatory Visit: Payer: Self-pay

## 2021-03-12 ENCOUNTER — Encounter: Payer: Self-pay | Admitting: Family Medicine

## 2021-03-12 ENCOUNTER — Other Ambulatory Visit: Payer: Self-pay | Admitting: Family Medicine

## 2021-03-12 VITALS — BP 106/60 | HR 63 | Temp 98.8°F | Resp 18 | Ht 72.0 in | Wt 154.0 lb

## 2021-03-12 DIAGNOSIS — I700 Atherosclerosis of aorta: Secondary | ICD-10-CM | POA: Diagnosis not present

## 2021-03-12 DIAGNOSIS — N401 Enlarged prostate with lower urinary tract symptoms: Secondary | ICD-10-CM | POA: Diagnosis not present

## 2021-03-12 DIAGNOSIS — G629 Polyneuropathy, unspecified: Secondary | ICD-10-CM

## 2021-03-12 DIAGNOSIS — R351 Nocturia: Secondary | ICD-10-CM | POA: Diagnosis not present

## 2021-03-12 DIAGNOSIS — G8929 Other chronic pain: Secondary | ICD-10-CM

## 2021-03-12 DIAGNOSIS — S46012S Strain of muscle(s) and tendon(s) of the rotator cuff of left shoulder, sequela: Secondary | ICD-10-CM

## 2021-03-12 DIAGNOSIS — M1990 Unspecified osteoarthritis, unspecified site: Secondary | ICD-10-CM | POA: Diagnosis not present

## 2021-03-12 DIAGNOSIS — D2372 Other benign neoplasm of skin of left lower limb, including hip: Secondary | ICD-10-CM | POA: Diagnosis not present

## 2021-03-12 DIAGNOSIS — Z8679 Personal history of other diseases of the circulatory system: Secondary | ICD-10-CM | POA: Diagnosis not present

## 2021-03-12 DIAGNOSIS — M79672 Pain in left foot: Secondary | ICD-10-CM | POA: Diagnosis not present

## 2021-03-12 DIAGNOSIS — G5793 Unspecified mononeuropathy of bilateral lower limbs: Secondary | ICD-10-CM | POA: Diagnosis not present

## 2021-03-12 MED ORDER — OXYCODONE HCL 15 MG PO TABS: 15.0000 mg | ORAL_TABLET | ORAL | 0 refills | Status: DC | PRN

## 2021-03-12 MED ORDER — OXYCODONE HCL 15 MG PO TABS: 15.0000 mg | ORAL_TABLET | Freq: Four times a day (QID) | ORAL | 0 refills | Status: DC | PRN

## 2021-03-12 MED ORDER — TAMSULOSIN HCL 0.4 MG PO CAPS: 0.4000 mg | ORAL_CAPSULE | Freq: Every day | ORAL | 3 refills | Status: DC

## 2021-03-12 NOTE — Telephone Encounter (Signed)
Daniel Owens from Dr. Karna Christmas office seen pt today and pt asked for a refill for Lyrica 75mg  1x a day Rx/ she advised pt that since this is a long term med that this need to be followed by Dr. Rosanna Owens Pt forget to ask during his visit/ Daniel Owens asked if Dr. Rosanna Owens would please refill Rx for Lyrica 75mg  1x daily / please advise

## 2021-03-12 NOTE — Telephone Encounter (Signed)
Ok to [facility]

## 2021-03-12 NOTE — Telephone Encounter (Signed)
Pt called back saying Dr. Vickki Muff had prescribed the Lyrica before but Dr. Vickki Muff said it would be better if dr. [name] prescribed it because he is his primary.

## 2021-03-12 NOTE — Telephone Encounter (Addendum)
Medication is not in pt's med list. Per [name], wants pt to bring proof of rx and who prescribed it by the office. LMOVM for pt to return call. Okay for [facility] to discuss with patient.

## 2021-03-12 NOTE — Progress Notes (Signed)
I,Daniel Owens,acting as a scribe for Daniel Durie, MD.,have documented all relevant documentation on the behalf of Daniel Durie, MD,as directed by  Daniel Durie, MD while in the presence of Daniel Durie, MD.   Established patient visit   Patient: Daniel Owens   DOB: January 03, 1946   75 y.o. Male  MRN: [id number] Visit Date: 03/12/2021  Today's healthcare provider: [name], MD   Chief Complaint  Patient presents with  . Follow-up  . Hypertension   Subjective    HPI  Patient comes in today for follow-up.  He is having some urinary symptoms with urgency recently. He is also had some increasing sensation of neuropathy with burning in his left great and right feet.  Wakes him up at night. Is a plantar wart being treated by Dr. Vickki Owens. He has been having allergy symptoms and has been taking a lot of over-the-counter decongestants lately. He is continues to complain of his chronic pain with arthritis and degenerative disc disease Hypertension, follow-up  BP Readings from Last 3 Encounters:  03/12/21 106/60  03/04/21 (!) 149/73  02/20/21 (!) 120/50   Wt Readings from Last 3 Encounters:  03/12/21 154 lb (69.9 kg)  03/04/21 152 lb (68.9 kg)  02/20/21 152 lb (68.9 kg)     He was last seen for hypertension 3 months ago.  BP at that visit was 133/51. Management since that visit includes; Take most blood pressure medications at night. On metoprolol and losartan. He reports good compliance with treatment. He is not having side effects. none He is exercising. He is adherent to low salt diet.   Outside blood pressures are is checking.  He does not smoke.  Use of agents associated with hypertension: none.   --------------------------------------------------------------------  Follow up for Other chronic pain  The patient was last seen for this 3 months ago. Changes made at last visit include; drug screening done.  He reports good compliance  with treatment. He feels that condition is Unchanged. He is not having side effects. none  --------------------------------------------------------------------        Medications: Outpatient Medications Prior to Visit  Medication Sig  . Ascorbic Acid (VITAMIN C) 1000 MG tablet Take 1,000 mg by mouth daily.  . B Complex Vitamins (VITAMIN B COMPLEX PO) Take 1 tablet by mouth daily.  . beta carotene 25000 UNIT capsule Take 25,000 Units by mouth daily.  . celecoxib (CELEBREX) 200 MG capsule TAKE 1 CAPSULE BY MOUTH EVERY DAY  . fish oil-omega-3 fatty acids 1000 MG capsule Take 1 g by mouth daily.  . folic acid (FOLVITE) 323 MCG tablet Take 1 tablet (400 mcg total) by mouth daily.  Marland Kitchen GLUCOSAMINE-CHONDROIT-VIT C-MN PO Take by mouth daily.   Marland Kitchen losartan (COZAAR) 50 MG tablet Take 1 tablet (50 mg total) by mouth daily.  . metoprolol succinate (TOPROL-XL) 25 MG 24 hr tablet Take 25 mg by mouth daily.  . Multiple Vitamin (MULTIVITAMIN WITH MINERALS) TABS tablet Take 1 tablet by mouth daily.  Marland Kitchen omeprazole (PRILOSEC) 20 MG capsule Take 1 capsule (20 mg total) by mouth daily.  . rosuvastatin (CRESTOR) 5 MG tablet TAKE 1 TABLET(5 MG) BY MOUTH DAILY  . Selenium 200 MCG TABS Take 200 mcg by mouth daily.  . Zinc 50 MG CAPS Take by mouth.  . [DISCONTINUED] oxyCODONE (ROXICODONE) 15 MG immediate release tablet Take 1 tablet (15 mg total) by mouth every 4 (four) hours as needed for pain.  . [DISCONTINUED] oxyCODONE (ROXICODONE)  15 MG immediate release tablet Take 1 tablet (15 mg total) by mouth every 6 (six) hours as needed for pain.  . [DISCONTINUED] oxyCODONE (ROXICODONE) 15 MG immediate release tablet Take 1 tablet (15 mg total) by mouth every 4 (four) hours as needed for pain.   No facility-administered medications prior to visit.    Review of Systems  Constitutional: Negative for appetite change, chills and fever.  Respiratory: Negative for chest tightness, shortness of breath and wheezing.    Cardiovascular: Negative for chest pain and palpitations.  Gastrointestinal: Negative for abdominal pain, nausea and vomiting.        Objective    BP 106/60 (BP Location: Right Arm, Patient Position: Sitting, Cuff Size: Normal)   Pulse 63   Temp 98.8 F (37.1 C) (Oral)   Resp 18   Ht 6' (1.829 m)   Wt 154 lb (69.9 kg)   SpO2 96%   BMI 20.89 kg/m  BP Readings from Last 3 Encounters:  03/12/21 106/60  03/04/21 (!) 149/73  02/20/21 (!) 120/50   Wt Readings from Last 3 Encounters:  03/12/21 154 lb (69.9 kg)  03/04/21 152 lb (68.9 kg)  02/20/21 152 lb (68.9 kg)       Physical Exam    No results found for any visits on 03/12/21.  Assessment & Plan     1. Other chronic pain Refill meds x3.  Follow-up 3 months - oxyCODONE (ROXICODONE) 15 MG immediate release tablet; Take 1 tablet (15 mg total) by mouth every 4 (four) hours as needed for pain.  Dispense: 120 tablet; Refill: 0 - oxyCODONE (ROXICODONE) 15 MG immediate release tablet; Take 1 tablet (15 mg total) by mouth every 6 (six) hours as needed for pain.  Dispense: 120 tablet; Refill: 0 - oxyCODONE (ROXICODONE) 15 MG immediate release tablet; Take 1 tablet (15 mg total) by mouth every 4 (four) hours as needed for pain.  Dispense: 120 tablet; Refill: 0  2. BPH associated with nocturia  - tamsulosin (FLOMAX) 0.4 MG CAPS capsule; Take 1 capsule (0.4 mg total) by mouth daily.  Dispense: 30 capsule; Refill: 3 - Ambulatory referral to Urology 1. BPH associated with nocturia Have also told the patient to stop over-the-counter decongestants as it may be aggravating his condition.  Refer to urology. - tamsulosin (FLOMAX) 0.4 MG CAPS capsule; Take 1 capsule (0.4 mg total) by mouth daily.  Dispense: 30 capsule; Refill: 3 - Ambulatory referral to Urology  2. Neuropathy Try  pregabalin at night.  3. Other chronic pain Ongoing issue for this patient - oxyCODONE (ROXICODONE) 15 MG immediate release tablet; Take 1 tablet (15 mg  total) by mouth every 4 (four) hours as needed for pain.  Dispense: 120 tablet; Refill: 0 - oxyCODONE (ROXICODONE) 15 MG immediate release tablet; Take 1 tablet (15 mg total) by mouth every 6 (six) hours as needed for pain.  Dispense: 120 tablet; Refill: 0 - oxyCODONE (ROXICODONE) 15 MG immediate release tablet; Take 1 tablet (15 mg total) by mouth every 4 (four) hours as needed for pain.  Dispense: 120 tablet; Refill: 0  4. Aortic atherosclerosis ([place]) All risk factors treated.  5. Traumatic complete tear of left rotator cuff, sequela   6. Arthritis   7. History of atrial fibrillation    Return in about 3 months (around 06/12/2021).      I, Daniel Durie, MD, have reviewed all documentation for this visit. The documentation on 03/18/21 for the exam, diagnosis, procedures, and orders are all accurate and  complete.    Raffaella Edison Cranford Mon, MD  [facility] [phone] (phone) [phone] (fax)  [place]

## 2021-03-12 NOTE — Patient Instructions (Addendum)
Try Pregabalin at night. Try over-the-counter Alpha Lipoic Acid. STOP ALL DECONGESTANTS.

## 2021-03-13 NOTE — Telephone Encounter (Signed)
It is not in Dr. Alvera Singh note at all.  I need to know the  dose before we can fill it.

## 2021-03-13 NOTE — Telephone Encounter (Signed)
Please review. Thanks!  

## 2021-03-13 NOTE — Telephone Encounter (Signed)
Pt reports 75mg  once daily.

## 2021-03-25 NOTE — Telephone Encounter (Signed)
Okay to write prescription.  Thank u

## 2021-03-25 NOTE — Telephone Encounter (Signed)
Medication pulled to be refilled. Please select how many refills. Thanks!

## 2021-03-26 MED ORDER — PREGABALIN 75 MG PO CAPS: 75.0000 mg | ORAL_CAPSULE | Freq: Two times a day (BID) | ORAL | 5 refills | Status: DC

## 2021-03-28 DIAGNOSIS — R21 Rash and other nonspecific skin eruption: Secondary | ICD-10-CM | POA: Diagnosis not present

## 2021-03-28 DIAGNOSIS — W57XXXA Bitten or stung by nonvenomous insect and other nonvenomous arthropods, initial encounter: Secondary | ICD-10-CM | POA: Diagnosis not present

## 2021-03-28 DIAGNOSIS — S30863A Insect bite (nonvenomous) of scrotum and testes, initial encounter: Secondary | ICD-10-CM | POA: Diagnosis not present

## 2021-04-02 DIAGNOSIS — B351 Tinea unguium: Secondary | ICD-10-CM | POA: Diagnosis not present

## 2021-04-02 DIAGNOSIS — M79672 Pain in left foot: Secondary | ICD-10-CM | POA: Diagnosis not present

## 2021-04-02 DIAGNOSIS — D2372 Other benign neoplasm of skin of left lower limb, including hip: Secondary | ICD-10-CM | POA: Diagnosis not present

## 2021-04-02 DIAGNOSIS — G5793 Unspecified mononeuropathy of bilateral lower limbs: Secondary | ICD-10-CM | POA: Diagnosis not present

## 2021-04-03 DIAGNOSIS — D0472 Carcinoma in situ of skin of left lower limb, including hip: Secondary | ICD-10-CM | POA: Diagnosis not present

## 2021-04-03 DIAGNOSIS — C44722 Squamous cell carcinoma of skin of right lower limb, including hip: Secondary | ICD-10-CM | POA: Diagnosis not present

## 2021-04-10 ENCOUNTER — Ambulatory Visit: Payer: Self-pay | Admitting: Urology

## 2021-04-23 ENCOUNTER — Emergency Department
Admission: EM | Admit: 2021-04-23 | Discharge: 2021-04-23 | Disposition: A | Discharge status: Home / Self Care | Payer: Medicare Other | Attending: Emergency Medicine | Admitting: Emergency Medicine

## 2021-04-23 ENCOUNTER — Emergency Department: Payer: Medicare Other

## 2021-04-23 ENCOUNTER — Other Ambulatory Visit: Payer: Self-pay

## 2021-04-23 DIAGNOSIS — I10 Essential (primary) hypertension: Secondary | ICD-10-CM | POA: Diagnosis not present

## 2021-04-23 DIAGNOSIS — Z79899 Other long term (current) drug therapy: Secondary | ICD-10-CM | POA: Diagnosis not present

## 2021-04-23 DIAGNOSIS — Z87891 Personal history of nicotine dependence: Secondary | ICD-10-CM | POA: Diagnosis not present

## 2021-04-23 DIAGNOSIS — N5089 Other specified disorders of the male genital organs: Secondary | ICD-10-CM | POA: Diagnosis not present

## 2021-04-23 DIAGNOSIS — N5082 Scrotal pain: Secondary | ICD-10-CM | POA: Diagnosis not present

## 2021-04-23 DIAGNOSIS — N50819 Testicular pain, unspecified: Secondary | ICD-10-CM

## 2021-04-23 DIAGNOSIS — N451 Epididymitis: Secondary | ICD-10-CM | POA: Diagnosis not present

## 2021-04-23 DIAGNOSIS — N50812 Left testicular pain: Secondary | ICD-10-CM | POA: Diagnosis not present

## 2021-04-23 DIAGNOSIS — I2510 Atherosclerotic heart disease of native coronary artery without angina pectoris: Secondary | ICD-10-CM | POA: Insufficient documentation

## 2021-04-23 MED ORDER — SULFAMETHOXAZOLE-TRIMETHOPRIM 800-160 MG PO TABS: 1.0000 | ORAL_TABLET | Freq: Two times a day (BID) | ORAL | 0 refills | Status: AC

## 2021-04-23 NOTE — ED Notes (Signed)
See triage note. Pt ambulatory to room c/o left sided testicle pain. Pt stating has happened before due to an infection. Pt also stating recent tick bite on right testicle that he took doxycycline for.

## 2021-04-23 NOTE — ED Triage Notes (Signed)
Pt comes from [facility] with c/o  left sided testicle pain. Pt states this started yesterday evening. Pt states pain and swelling.

## 2021-04-23 NOTE — ED Provider Notes (Signed)
[facility] Emergency Department Provider Note  ____________________________________________  Time seen: Approximately 1:43 PM  I have reviewed the triage vital signs and the nursing notes.   HISTORY  Chief Complaint Testicle Pain    HPI Daniel Owens is a 75 y.o. male with a past history of hypertension who comes ED complaining of left testicle pain ongoing for the past days.  Feels like there is a fullness in the scrotum.  No fever or vomiting.  No dysuria or penile discharge or hematuria.  He is ambulatory, pain is moderate.  Constant, worse with palpation of the area, no alleviating factors.    Past Medical History:  Diagnosis Date   Abnormal stress test    a. 07/2017 ETT: Ex 10:15, no chest pain, HTN response, 94mm ST dep in II, II, aVF, V4-V6 - returned to baseline in 5 mins-->Intermediate risk study. b. Cardiac CTA normal without CAD (08/2017)   Aortic atherosclerosis ([place])    a. Incidentally noted on Chest CT in 2016.   Carotid arterial disease ([place])    a. 07/2014 carotid u/s: mild <50%, bilat ICA stenosis.   Hyperlipidemia    Hypertension    Macrocytic anemia    osteoarthritis    Palpitations    a. ? prior dx of PAF.   Raynauds syndrome    Systolic murmur    a. Reports h/o rheumatic fever as a child;  b. 07/2017 Echo: EF 55-60%, no rwma, triv AI, nl RV fxn.     Patient Active Problem List   Diagnosis Date Noted   Bilateral primary osteoarthritis of knee 11/28/2020   Pallor of extremity 08/03/2020   Varicose veins with pain 08/03/2020   Scoliosis, unspecified 03/30/2020   Primary osteoarthritis of right knee 03/22/2020   Strain of tendon of left rotator cuff 06/30/2019   Chronic anemia 11/09/2018   H/O macrocytic anemia 11/09/2018   Degenerative scoliosis 07/29/2017   DOE (dyspnea on exertion) 07/17/2017   Heart murmur 07/17/2017   Abnormal EKG 07/17/2017   Carotid artery disease ([place]) 07/17/2017   Aortic atherosclerosis ([place])  05/20/2017   Hyperlipemia 05/20/2017   Closed compression fracture of thoracic vertebra ([place]) 03/04/2017   Cervical disc disorder with radiculopathy 11/19/2016   BPH associated with nocturia 08/05/2016   Lumbar spondylosis 02/14/2016   Esophageal reflux 03/18/2015   Arthritis 03/18/2015   Back pain, chronic 03/18/2015   Cardiac conduction disorder 03/18/2015   Narrowing of intervertebral disc space 03/18/2015   Essential hypertension 03/18/2015   History of anemia 03/18/2015   History of atrial fibrillation 03/18/2015   Weak pulse 03/18/2015   Palpitations 03/18/2015   Peyronie's disease 03/18/2015   Raynaud's syndrome 03/18/2015   Complete rotator cuff rupture of left shoulder 05/12/2013     Past Surgical History:  Procedure Laterality Date   APPENDECTOMY     BILATERAL KNEE ARTHROSCOPY     HERNIA REPAIR     NECK SURGERY     involving disc   NOSE SURGERY     RETINAL DETACHMENT SURGERY     SHOULDER SURGERY     Right-x 3 overall   TENDON REPAIR     involving Tricep tendon     Prior to Admission medications   Medication Sig Start Date End Date Taking? Authorizing Provider  sulfamethoxazole-trimethoprim (BACTRIM DS) 800-160 MG tablet Take 1 tablet by mouth 2 (two) times daily for 10 days. 04/23/21 05/03/21 Yes Carrie Mew, MD  Ascorbic Acid (VITAMIN C) 1000 MG tablet Take 1,000 mg by mouth  daily.    [provider]  B Complex Vitamins (VITAMIN B COMPLEX PO) Take 1 tablet by mouth daily.    [provider]  beta carotene 25000 UNIT capsule Take 25,000 Units by mouth daily.    [provider]  celecoxib (CELEBREX) 200 MG capsule TAKE 1 CAPSULE BY MOUTH EVERY DAY 10/25/19   Jerrol Banana., MD  fish oil-omega-3 fatty acids 1000 MG capsule Take 1 g by mouth daily.    [provider]  folic acid (FOLVITE) 081 MCG tablet Take 1 tablet (400 mcg total) by mouth daily. 07/30/17   Jerrol Banana., MD  GLUCOSAMINE-CHONDROIT-VIT C-MN  PO Take by mouth daily.  01/16/15   [provider]  losartan (COZAAR) 50 MG tablet Take 1 tablet (50 mg total) by mouth daily. 12/31/20 03/31/21  End, Harrell Gave, MD  metoprolol succinate (TOPROL-XL) 25 MG 24 hr tablet Take 25 mg by mouth daily.    [provider]  Multiple Vitamin (MULTIVITAMIN WITH MINERALS) TABS tablet Take 1 tablet by mouth daily.    [provider]  omeprazole (PRILOSEC) 20 MG capsule Take 1 capsule (20 mg total) by mouth daily. 09/18/20   Jerrol Banana., MD  oxyCODONE (ROXICODONE) 15 MG immediate release tablet Take 1 tablet (15 mg total) by mouth every 4 (four) hours as needed for pain. 03/12/21   Jerrol Banana., MD  oxyCODONE (ROXICODONE) 15 MG immediate release tablet Take 1 tablet (15 mg total) by mouth every 6 (six) hours as needed for pain. 03/12/21   Jerrol Banana., MD  oxyCODONE (ROXICODONE) 15 MG immediate release tablet Take 1 tablet (15 mg total) by mouth every 4 (four) hours as needed for pain. 03/12/21   Jerrol Banana., MD  pregabalin (LYRICA) 75 MG capsule Take 1 capsule (75 mg total) by mouth 2 (two) times daily. 03/26/21   Jerrol Banana., MD  rosuvastatin (CRESTOR) 5 MG tablet TAKE 1 TABLET(5 MG) BY MOUTH DAILY 12/18/20   Jerrol Banana., MD  Selenium 200 MCG TABS Take 200 mcg by mouth daily.    [provider]  tamsulosin (FLOMAX) 0.4 MG CAPS capsule Take 1 capsule (0.4 mg total) by mouth daily. 03/12/21   Jerrol Banana., MD  Zinc 50 MG CAPS Take by mouth.    [provider]     Allergies Patient has no known allergies.   Family History  Problem Relation Age of Onset   Stroke Mother    Uterine cancer Mother    Throat cancer Father    Allergic rhinitis Brother    Prostate cancer Maternal Uncle    Prostate cancer Paternal Uncle    Heart attack Maternal Grandmother    Heart attack Maternal Grandfather     Social History Social History   Tobacco Use    Smoking status: Former    Pack years: 0.00    Types: Cigarettes    Quit date: 10/19/1977    Years since quitting: 43.5   Smokeless tobacco: Current    Types: Chew   Tobacco comments:    trying to quit  Vaping Use   Vaping Use: Never used  Substance Use Topics   Alcohol use: Yes    Alcohol/week: 0.0 standard drinks    Comment: very seldom- wine   Drug use: No    Review of Systems  Constitutional:   No fever or chills.  ENT:   No sore throat. No  rhinorrhea. Cardiovascular:   No chest pain or syncope. Respiratory:   No dyspnea or cough. Gastrointestinal:   Negative for abdominal pain, vomiting and diarrhea.  Musculoskeletal:   Negative for focal pain or swelling All other systems reviewed and are negative except as documented above in ROS and HPI.  ____________________________________________   PHYSICAL EXAM:  VITAL SIGNS: ED Triage Vitals [04/23/21 1157]  Enc Vitals Group     BP (!) 138/54     Pulse Rate 63     Resp 20     Temp 98.5 F (36.9 C)     Temp Source Oral     SpO2 97 %     Weight      Height      Head Circumference      Peak Flow      Pain Score 5     Pain Loc      Pain Edu?      Excl. in [place]?     Vital signs reviewed, nursing assessments reviewed.   Constitutional:   Alert and oriented. Non-toxic appearance. Eyes:   Conjunctivae are normal. EOMI. ENT      Head:   Normocephalic and atraumatic.            Neck:   No meningismus. Full ROM. Cardiovascular:   RRR.  Cap refill less than 2 seconds. Respiratory:   Unlabored breathing Genitourinary:   Examined while standing.  There is focal tenderness at the left epididymis.  Testicles nontender, normal lie.  No scrotal skin inflammation.  No hernia. Musculoskeletal:   Normal range of motion in all extremities.  No edema. Neurologic:   Normal speech and language.  Motor grossly intact. No acute focal neurologic deficits are appreciated.  Skin:    Skin is warm, dry and intact. No rash noted.  No  wounds.  ____________________________________________    LABS (pertinent positives/negatives) (all labs ordered are listed, but only abnormal results are displayed) Labs Reviewed - No data to display ____________________________________________   EKG  ____________________________________________    RADIOLOGY  US SCROTUM W/DOPPLER  Result Date: 04/23/2021 CLINICAL DATA:  LEFT-sided testicular pain scrotal pain for 2 days with swelling. EXAM: SCROTAL ULTRASOUND DOPPLER ULTRASOUND OF THE TESTICLES TECHNIQUE: Complete ultrasound examination of the testicles, epididymis, and other scrotal structures was performed. Color and spectral Doppler ultrasound were also utilized to evaluate blood flow to the testicles. COMPARISON:  None FINDINGS: Right testicle Measurements: 3.5 x 2.2 x 2.2 cm. No mass or microlithiasis visualized. Left testicle Measurements: 3.0 x 2.3 x 2.3 cm. No mass or microlithiasis visualized. Right epididymis:  Normal in size and appearance. Left epididymis: Mildly heterogeneous, enlarged and hyperemic in longitudinal measuring approximately 1 cm in greatest thickness as compared to 0.6 cm on the contralateral side. Hydrocele:  None visualized. Varicocele:  None visualized. Pulsed Doppler interrogation of both testes demonstrates normal low resistance arterial and venous waveforms bilaterally. IMPRESSION: Signs of LEFT epididymitis. No sonographic evidence of testicular torsion at this time with preserved low resistance flow to the bilateral testes. Electronically Signed   By: Zetta Bills M.D.   On: 04/23/2021 13:13    ____________________________________________   PROCEDURES Procedures  ____________________________________________  CLINICAL IMPRESSION / ASSESSMENT AND PLAN / ED COURSE  Pertinent labs & imaging results that were available during my care of the patient were reviewed by me and considered in my medical decision making (see chart for details).  Daniel Owens  was evaluated in Emergency Department on 04/23/2021 for the symptoms  described in the history of present illness. He was evaluated in the context of the global COVID-19 pandemic, which necessitated consideration that the patient might be at risk for infection with the SARS-CoV-2 virus that causes COVID-19. Institutional protocols and algorithms that pertain to the evaluation of patients at risk for COVID-19 are in a state of rapid change based on information released by regulatory bodies including the CDC and federal and state organizations. These policies and algorithms were followed during the patient's care in the ED.   Patient presents with left testicular pain, clinically consistent with epididymitis.  Ultrasound also confirms epididymitis without signs of torsion or abscess.  He has had multiple tendon and joint injuries in the past, will avoid fluoroquinolones and prescribe Bactrim.      ____________________________________________   FINAL CLINICAL IMPRESSION(S) / ED DIAGNOSES    Final diagnoses:  Left epididymitis     ED Discharge Orders          Ordered    sulfamethoxazole-trimethoprim (BACTRIM DS) 800-160 MG tablet  2 times daily        04/23/21 1342            Portions of this note were generated with dragon dictation software. Dictation errors may occur despite best attempts at proofreading.   Carrie Mew, MD 04/23/21 1346

## 2021-05-10 ENCOUNTER — Other Ambulatory Visit: Payer: Self-pay | Admitting: Family Medicine

## 2021-05-10 DIAGNOSIS — I700 Atherosclerosis of aorta: Secondary | ICD-10-CM

## 2021-05-29 ENCOUNTER — Ambulatory Visit (INDEPENDENT_AMBULATORY_CARE_PROVIDER_SITE_OTHER): Payer: Medicare Other | Admitting: Internal Medicine

## 2021-05-29 ENCOUNTER — Other Ambulatory Visit: Payer: Self-pay

## 2021-05-29 ENCOUNTER — Encounter: Payer: Self-pay | Admitting: Internal Medicine

## 2021-05-29 VITALS — BP 130/64 | HR 74 | Ht 72.0 in | Wt 151.0 lb

## 2021-05-29 DIAGNOSIS — R600 Localized edema: Secondary | ICD-10-CM | POA: Diagnosis not present

## 2021-05-29 DIAGNOSIS — I10 Essential (primary) hypertension: Secondary | ICD-10-CM

## 2021-05-29 NOTE — Progress Notes (Signed)
Follow-up Outpatient Visit Date: 05/29/2021  Primary Care Provider: Jerrol Banana., MD [address] [id number]  Chief Complaint: Follow-up hypertension  HPI:  Daniel Owens is a 75 y.o. male with history of hypertension, mild carotid artery disease, palpitations, and systolic heart murmur, who presents for follow-up of hypertension.  He was last seen in our office in early May by Dr. Garen Lah, at which time Mr. Bitler reported improvement in blood pressure with prior addition of losartan.  No medication changes or additional testing were pursued.  Today, Mr. Ure reports that he has been doing fairly well.  He initially noticed some low diastolic readings with addition of losartan, though this has improved since he began taking the medication at night.  He sometimes notices diastolic readings in the [id number], albeit without symptoms.  He notes occasional fatigue as well as some erectile dysfunction and wonders if his medications could be contributing.  He also has mild dependent edema, noticeable at the [place] of the day.  He denies chest pain, shortness of breath, and palpitations.  --------------------------------------------------------------------------------------------------  Past Medical History:  Diagnosis Date   Abnormal stress test    a. 07/2017 ETT: Ex 10:15, no chest pain, HTN response, 77m ST dep in II, II, aVF, V4-V6 - returned to baseline in 5 mins-->Intermediate risk study. b. Cardiac CTA normal without CAD (08/2017)   Aortic atherosclerosis ([place]    a. Incidentally noted on Chest CT in 2016.   Carotid arterial disease ([place]    a. 07/2014 carotid u/s: mild <50%, bilat ICA stenosis.   Hyperlipidemia    Hypertension    Macrocytic anemia    osteoarthritis    Palpitations    a. ? prior dx of PAF.   Raynauds syndrome    Systolic murmur    a. Reports h/o rheumatic fever as a child;  b. 07/2017 Echo: EF 55-60%, no rwma, triv AI, nl RV fxn.   Past  Surgical History:  Procedure Laterality Date   APPENDECTOMY     BILATERAL KNEE ARTHROSCOPY     HERNIA REPAIR     NECK SURGERY     involving disc   NOSE SURGERY     RETINAL DETACHMENT SURGERY     SHOULDER SURGERY     Right-x 3 overall   TENDON REPAIR     involving Tricep tendon     Current Meds  Medication Sig   Ascorbic Acid (VITAMIN C) 1000 MG tablet Take 1,000 mg by mouth daily.   B Complex Vitamins (VITAMIN B COMPLEX PO) Take 1 tablet by mouth daily.   beta carotene 25000 UNIT capsule Take 25,000 Units by mouth daily.   celecoxib (CELEBREX) 200 MG capsule TAKE 1 CAPSULE BY MOUTH EVERY DAY   fish oil-omega-3 fatty acids 1000 MG capsule Take 1 g by mouth daily.   folic acid (FOLVITE) 4A999333MCG tablet Take 1 tablet (400 mcg total) by mouth daily.   GLUCOSAMINE-CHONDROIT-VIT C-MN PO Take by mouth daily.    losartan (COZAAR) 50 MG tablet Take 1 tablet (50 mg total) by mouth daily.   metoprolol succinate (TOPROL-XL) 25 MG 24 hr tablet Take 25 mg by mouth daily.   Multiple Vitamin (MULTIVITAMIN WITH MINERALS) TABS tablet Take 1 tablet by mouth daily.   omeprazole (PRILOSEC) 20 MG capsule Take 1 capsule (20 mg total) by mouth daily.   oxyCODONE (ROXICODONE) 15 MG immediate release tablet Take 1 tablet (15 mg total) by mouth every 4 (four) hours as needed  for pain.   oxyCODONE (ROXICODONE) 15 MG immediate release tablet Take 1 tablet (15 mg total) by mouth every 6 (six) hours as needed for pain.   oxyCODONE (ROXICODONE) 15 MG immediate release tablet Take 1 tablet (15 mg total) by mouth every 4 (four) hours as needed for pain.   pregabalin (LYRICA) 75 MG capsule Take 1 capsule (75 mg total) by mouth 2 (two) times daily.   rosuvastatin (CRESTOR) 5 MG tablet TAKE 1 TABLET(5 MG) BY MOUTH DAILY   Selenium 200 MCG TABS Take 200 mcg by mouth daily.   tamsulosin (FLOMAX) 0.4 MG CAPS capsule Take 1 capsule (0.4 mg total) by mouth daily.   Zinc 50 MG CAPS Take 1 capsule by mouth daily.     Allergies: Patient has no known allergies.  Social History   Tobacco Use   Smoking status: Former    Types: Cigarettes    Quit date: 10/19/1977    Years since quitting: 43.6   Smokeless tobacco: Current    Types: Chew   Tobacco comments:    trying to quit  Vaping Use   Vaping Use: Never used  Substance Use Topics   Alcohol use: Yes    Alcohol/week: 0.0 standard drinks    Comment: very seldom- wine   Drug use: No    Family History  Problem Relation Age of Onset   Stroke Mother    Uterine cancer Mother    Throat cancer Father    Allergic rhinitis Brother    Prostate cancer Maternal Uncle    Prostate cancer Paternal Uncle    Heart attack Maternal Grandmother    Heart attack Maternal Grandfather     Review of Systems: A 12-system review of systems was performed and was negative except as noted in the HPI.  --------------------------------------------------------------------------------------------------  Physical Exam: BP 130/64 (BP Location: Left Arm, Patient Position: Sitting, Cuff Size: Normal)   Pulse 74   Ht 6' (1.829 m)   Wt 151 lb (68.5 kg)   SpO2 98%   BMI 20.48 kg/m   General:  NAD. Neck: No JVD or HJR. Lungs: Clear to auscultation bilaterally without wheezes or crackles. Heart: Regular rate and rhythm without murmurs, rubs, or gallops. Abdomen: Soft, nontender, nondistended. Extremities: No lower extremity edema.   Lab Results  Component Value Date   WBC 8.1 09/03/2020   HGB 12.5 (L) 09/03/2020   HCT 36.9 (L) 09/03/2020   MCV 104.8 (H) 09/03/2020   PLT 185 09/03/2020    Lab Results  Component Value Date   NA 137 01/14/2021   K 3.7 01/14/2021   CL 101 01/14/2021   CO2 28 01/14/2021   BUN 23 01/14/2021   CREATININE 1.08 01/14/2021   GLUCOSE 96 01/14/2021   ALT 14 06/19/2020    Lab Results  Component Value Date   CHOL 167 06/19/2020   HDL 84 06/19/2020   LDLCALC 67 06/19/2020   TRIG 87 06/19/2020   CHOLHDL 2.0 06/19/2020     --------------------------------------------------------------------------------------------------  ASSESSMENT AND PLAN: Hypertension: Blood pressure reasonably well controlled today.  Given some fatigue and erectile dysfunction, we discussed discontinuation of metoprolol.  However, given that blood pressure control has been quite good, Mr. Kristoff wishes to defer any medication changes today.  Leg edema: Some dependent edema noted.  Vascular surgery evaluation last year showed no evidence of venous insufficiency.  We will repeat an echo to ensure the new structural abnormalities have not developed to explain the swelling.  Follow-up: Return to clinic in 6  months.  Nelva Bush, MD 05/29/2021 11:55 AM

## 2021-05-29 NOTE — Patient Instructions (Signed)
Medication Instructions:   Your physician recommends that you continue on your current medications as directed. Please refer to the Current Medication list given to you today.  *If you need a refill on your cardiac medications before your next appointment, please call your pharmacy*   Lab Work:  None ordered  Testing/Procedures:  Your physician has requested that you have an echocardiogram. Echocardiography is a painless test that uses sound waves to create images of your heart. It provides your doctor with information about the size and shape of your heart and how well your heart's chambers and valves are working. This procedure takes approximately one hour. There are no restrictions for this procedure.   Follow-Up: At [facility], you and your health needs are our priority.  As part of our continuing mission to provide you with exceptional heart care, we have created designated Provider Care Teams.  These Care Teams include your primary Cardiologist (physician) and Advanced Practice Providers (APPs -  Physician Assistants and Nurse Practitioners) who all work together to provide you with the care you need, when you need it.  We recommend signing up for the patient portal called "MyChart".  Sign up information is provided on this After Visit Summary.  MyChart is used to connect with patients for Virtual Visits (Telemedicine).  Patients are able to view lab/test results, encounter notes, upcoming appointments, etc.  Non-urgent messages can be sent to your provider as well.   To learn more about what you can do with MyChart, go to [email or web address].    Your next appointment:   6 month(s)  The format for your next appointment:   In Person  Provider:   You may see Nelva Bush, MD or one of the following Advanced Practice Providers on your designated Care Team:   Murray Hodgkins, NP Christell Faith, PA-C Marrianne Mood, PA-C Cadence Kaltag, [place]

## 2021-06-11 DIAGNOSIS — Z8669 Personal history of other diseases of the nervous system and sense organs: Secondary | ICD-10-CM | POA: Diagnosis not present

## 2021-06-11 DIAGNOSIS — H2510 Age-related nuclear cataract, unspecified eye: Secondary | ICD-10-CM | POA: Diagnosis not present

## 2021-06-12 ENCOUNTER — Ambulatory Visit (INDEPENDENT_AMBULATORY_CARE_PROVIDER_SITE_OTHER): Payer: Medicare Other | Admitting: Family Medicine

## 2021-06-12 ENCOUNTER — Encounter: Payer: Self-pay | Admitting: Family Medicine

## 2021-06-12 ENCOUNTER — Other Ambulatory Visit: Payer: Self-pay

## 2021-06-12 VITALS — BP 108/69 | HR 69 | Resp 16 | Ht 72.0 in | Wt 154.0 lb

## 2021-06-12 DIAGNOSIS — N401 Enlarged prostate with lower urinary tract symptoms: Secondary | ICD-10-CM

## 2021-06-12 DIAGNOSIS — E7800 Pure hypercholesterolemia, unspecified: Secondary | ICD-10-CM

## 2021-06-12 DIAGNOSIS — I700 Atherosclerosis of aorta: Secondary | ICD-10-CM

## 2021-06-12 DIAGNOSIS — R351 Nocturia: Secondary | ICD-10-CM | POA: Diagnosis not present

## 2021-06-12 DIAGNOSIS — G629 Polyneuropathy, unspecified: Secondary | ICD-10-CM

## 2021-06-12 DIAGNOSIS — G8929 Other chronic pain: Secondary | ICD-10-CM

## 2021-06-12 DIAGNOSIS — I10 Essential (primary) hypertension: Secondary | ICD-10-CM

## 2021-06-12 MED ORDER — OXYCODONE HCL 15 MG PO TABS: 15.0000 mg | ORAL_TABLET | ORAL | 0 refills | Status: DC | PRN

## 2021-06-12 MED ORDER — OXYCODONE HCL 15 MG PO TABS: 15.0000 mg | ORAL_TABLET | Freq: Four times a day (QID) | ORAL | 0 refills | Status: DC | PRN

## 2021-06-12 NOTE — Progress Notes (Signed)
I,[name],acting as a scribe for [name], MD.,have documented all relevant documentation on the behalf of [name], MD,as directed by  [name], MD while in the presence of [name], MD.   Established patient visit   Patient: Daniel Owens   DOB: 1946/09/08   75 y.o. Male  MRN: [id number] Visit Date: 06/12/2021  Today's healthcare provider: [name], MD   Chief Complaint  Patient presents with   Follow-up   Subjective  -------------------------------------------------------------------------------------------------------------------- HPI  Patient comes in today for follow-up.  He continues to complain of chronic pain.  Flomax helps his nocturia. Follow up for Other chronic pain:  The patient was last seen for this 3 months ago. Changes made at last visit include; Refill meds x3.  Follow-up 3 months.  He reports good compliance with treatment. He feels that condition is Unchanged. He is not having side effects. none  ----------------------------------------------------------------------------------------- Follow up for BPH associated with nocturia:  The patient was last seen for this 3 months ago. Changes made at last visit include; advised patient to stop over-the-counter decongestants as it may be aggravating his condition.  Referred to urology.  He reports good compliance with treatment. He feels that condition is Unchanged. He is not having side effects. none  -----------------------------------------------------------------------------------------       Medications: Outpatient Medications Prior to Visit  Medication Sig   Ascorbic Acid (VITAMIN C) 1000 MG tablet Take 1,000 mg by mouth daily.   B Complex Vitamins (VITAMIN B COMPLEX PO) Take 1 tablet by mouth daily.   beta carotene 25000 UNIT capsule Take 25,000 Units by mouth daily.   celecoxib (CELEBREX) 200 MG capsule TAKE 1 CAPSULE BY MOUTH EVERY DAY    fish oil-omega-3 fatty acids 1000 MG capsule Take 1 g by mouth daily.   folic acid (FOLVITE) A999333 MCG tablet Take 1 tablet (400 mcg total) by mouth daily.   GLUCOSAMINE-CHONDROIT-VIT C-MN PO Take by mouth daily.    losartan (COZAAR) 50 MG tablet Take 1 tablet (50 mg total) by mouth daily.   metoprolol succinate (TOPROL-XL) 25 MG 24 hr tablet Take 25 mg by mouth daily.   Multiple Vitamin (MULTIVITAMIN WITH MINERALS) TABS tablet Take 1 tablet by mouth daily.   omeprazole (PRILOSEC) 20 MG capsule Take 1 capsule (20 mg total) by mouth daily.   oxyCODONE (ROXICODONE) 15 MG immediate release tablet Take 1 tablet (15 mg total) by mouth every 4 (four) hours as needed for pain.   oxyCODONE (ROXICODONE) 15 MG immediate release tablet Take 1 tablet (15 mg total) by mouth every 6 (six) hours as needed for pain.   oxyCODONE (ROXICODONE) 15 MG immediate release tablet Take 1 tablet (15 mg total) by mouth every 4 (four) hours as needed for pain.   pregabalin (LYRICA) 75 MG capsule Take 1 capsule (75 mg total) by mouth 2 (two) times daily.   rosuvastatin (CRESTOR) 5 MG tablet TAKE 1 TABLET(5 MG) BY MOUTH DAILY   Selenium 200 MCG TABS Take 200 mcg by mouth daily.   tamsulosin (FLOMAX) 0.4 MG CAPS capsule Take 1 capsule (0.4 mg total) by mouth daily.   Zinc 50 MG CAPS Take 1 capsule by mouth daily.   No facility-administered medications prior to visit.    Review of Systems  Constitutional:  Negative for appetite change, chills and fever.  Respiratory:  Negative for chest tightness, shortness of breath and wheezing.   Cardiovascular:  Negative for chest pain and palpitations.  Gastrointestinal:  Negative for abdominal pain, nausea and vomiting.       Objective  -------------------------------------------------------------------------------------------------------------------- BP 108/69 (BP Location: Left Arm, Patient Position: Sitting, Cuff Size: Normal)   Pulse 69   Resp 16   Ht 6' (1.829 m)   Wt 154 lb  (69.9 kg)   SpO2 96%   BMI 20.89 kg/m  BP Readings from Last 3 Encounters:  06/12/21 108/69  05/29/21 130/64  04/23/21 (!) 138/54   Wt Readings from Last 3 Encounters:  06/12/21 154 lb (69.9 kg)  05/29/21 151 lb (68.5 kg)  03/12/21 154 lb (69.9 kg)       Physical Exam Vitals reviewed.  Constitutional:      Appearance: Normal appearance.  HENT:     Head: Normocephalic and atraumatic.     Right Ear: External ear normal.     Left Ear: External ear normal.  Eyes:     General: No scleral icterus.    Conjunctiva/sclera: Conjunctivae normal.  Cardiovascular:     Rate and Rhythm: Normal rate and regular rhythm.     Pulses: Normal pulses.     Heart sounds: Normal heart sounds.     Comments: Pulses good. Large varicose veins. Chronic skin changes due to venous stasis. Pulmonary:     Effort: Pulmonary effort is normal.     Breath sounds: Normal breath sounds.  Musculoskeletal:     Right lower leg: No edema.     Left lower leg: No edema.  Skin:    General: Skin is warm and dry.     Capillary Refill: Capillary refill takes 2 to 3 seconds.  Neurological:     General: No focal deficit present.     Mental Status: He is alert and oriented to person, place, and time.  Psychiatric:        Mood and Affect: Mood normal.        Behavior: Behavior normal.        Thought Content: Thought content normal.        Judgment: Judgment normal.      No results found for any visits on 06/12/21.  Assessment & Plan  ----------------------------------------------------------------------------------------------------------------------  1. Other chronic pain This is the patient's main complaint at all time.  Patient has been on this dose of oxycodone for at least 20 years.  I am worried about this dose as he moves forward.  May need pain clinic and/or rotation of narcotic - oxyCODONE (ROXICODONE) 15 MG immediate release tablet; Take 1 tablet (15 mg total) by mouth every 4 (four) hours as needed  for pain.  Dispense: 120 tablet; Refill: 0 - oxyCODONE (ROXICODONE) 15 MG immediate release tablet; Take 1 tablet (15 mg total) by mouth every 6 (six) hours as needed for pain.  Dispense: 120 tablet; Refill: 0 - oxyCODONE (ROXICODONE) 15 MG immediate release tablet; Take 1 tablet (15 mg total) by mouth every 4 (four) hours as needed for pain.  Dispense: 120 tablet; Refill: 0  2. BPH associated with nocturia Continue Flomax  3. Neuropathy Consider trying gabapentin in the a.m.  4. Aortic atherosclerosis ([place]) All risk factors treated with blood pressure and lipids treated, patient non-smoker  5. Pure hypercholesterolemia On rosuvastatin   6. Essential hypertension On metoprolol. Follow-up 3 months for narcotic  No follow-ups on file.      I, [name], MD, have reviewed all documentation for this visit. The documentation on 06/20/21 for the exam, diagnosis, procedures, and orders are all accurate and complete.  Ram Haugan Cranford Mon, MD  [facility] [phone] (phone) [phone] (fax)  [place]

## 2021-06-19 ENCOUNTER — Other Ambulatory Visit: Payer: Self-pay

## 2021-06-19 ENCOUNTER — Ambulatory Visit (INDEPENDENT_AMBULATORY_CARE_PROVIDER_SITE_OTHER): Payer: Medicare Other

## 2021-06-19 DIAGNOSIS — R600 Localized edema: Secondary | ICD-10-CM

## 2021-06-19 LAB — ECHOCARDIOGRAM COMPLETE
AR max vel: 3.01 cm2
AV Area VTI: 3.2 cm2
AV Area mean vel: 3.04 cm2
AV Mean grad: 7 mmHg
AV Peak grad: 13.8 mmHg
Ao pk vel: 1.86 m/s
Area-P 1/2: 2.59 cm2
MV VTI: 3.21 cm2
S' Lateral: 2.8 cm

## 2021-06-21 ENCOUNTER — Telehealth: Payer: Self-pay | Admitting: *Deleted

## 2021-06-21 MED ORDER — FUROSEMIDE 20 MG PO TABS
20.0000 mg | ORAL_TABLET | Freq: Every day | ORAL | 5 refills | Status: DC | PRN
Start: 2021-06-21 — End: 2021-10-15

## 2021-06-21 NOTE — Telephone Encounter (Signed)
-----   Message from Nelva Bush, MD sent at 06/20/2021  7:37 AM EDT ----- Please let Daniel Owens know that his echocardiogram shows that his heart is contracting well.  No significant valvular abnormality is seen.  There is evidence of increased central venous pressures suggesting some fluid retention that may explain his leg swelling.  I suggest that we add furosemide 20 mg daily as needed for leg swelling to see if this helps his symptoms.

## 2021-06-21 NOTE — Telephone Encounter (Signed)
Pt notified of echo results and provider's recc.  Pt voiced understanding.  He will start Furosemide 20 mg daily AS NEEDED for leg swelling. Pt will let us know of any further questions.

## 2021-06-26 ENCOUNTER — Other Ambulatory Visit: Payer: Self-pay | Admitting: Internal Medicine

## 2021-07-16 ENCOUNTER — Telehealth: Payer: Self-pay

## 2021-07-16 NOTE — Telephone Encounter (Signed)
Copied from [place] [id number] [id number]. Topic: Appointment Scheduling - Scheduling Inquiry for Clinic >> Jul 16, 2021  2:25 PM Greggory Keen D wrote: Reason for CRM: Pt called saying he never got his [place] and said he needs to get one before he gets his CPE with Dr. [name].  CB#[phone]

## 2021-07-17 NOTE — Telephone Encounter (Signed)
Returned call to patient. Advised [facility] will be with his CPE appt on 10/03/2021.

## 2021-07-24 DIAGNOSIS — M1711 Unilateral primary osteoarthritis, right knee: Secondary | ICD-10-CM | POA: Diagnosis not present

## 2021-07-24 DIAGNOSIS — M170 Bilateral primary osteoarthritis of knee: Secondary | ICD-10-CM | POA: Diagnosis not present

## 2021-07-30 ENCOUNTER — Other Ambulatory Visit: Payer: Self-pay | Admitting: Family Medicine

## 2021-07-30 NOTE — Telephone Encounter (Signed)
Copied from [place] [id number]. Topic: Quick Communication - Rx Refill/Question >> Jul 30, 2021  3:04 PM Yvette Rack wrote: Pt requests 90 day supply  Medication: celecoxib (CELEBREX) 200 MG capsule  Has the patient contacted their pharmacy? Yes.  Pt told to contact provider (Agent: If no, request that the patient contact the pharmacy for the refill.) (Agent: If yes, when and what did the pharmacy advise?)  Preferred Pharmacy (with phone number or street name): [facility], [place] [address] Phone: [phone]   Fax: [phone]  Has the patient been seen for an appointment in the last year OR does the patient have an upcoming appointment? Yes.    Agent: Please be advised that RX refills may take up to 3 business days. We ask that you follow-up with your pharmacy.

## 2021-07-31 MED ORDER — CELECOXIB 200 MG PO CAPS: ORAL_CAPSULE | ORAL | 0 refills | Status: DC

## 2021-07-31 NOTE — Telephone Encounter (Signed)
Requested Prescriptions  Pending Prescriptions Disp Refills  . celecoxib (CELEBREX) 200 MG capsule 90 capsule 0    Sig: TAKE 1 CAPSULE BY MOUTH EVERY DAY     Analgesics:  COX2 Inhibitors Failed - 07/30/2021  3:37 PM      Failed - HGB in normal range and within 360 days    Hemoglobin  Date Value Ref Range Status  09/03/2020 12.5 (L) 13.0 - 17.0 g/dL Final  06/19/2020 11.9 (L) 13.0 - 17.7 g/dL Final         Passed - Cr in normal range and within 360 days    Creatinine, Ser  Date Value Ref Range Status  01/14/2021 1.08 0.61 - 1.24 mg/dL Final         Passed - Patient is not pregnant      Passed - Valid encounter within last 12 months    Recent Outpatient Visits          1 month ago Other chronic pain   [facility] Jerrol Banana., MD   4 months ago BPH associated with nocturia   [facility] Jerrol Banana., MD   7 months ago Essential hypertension   [facility] Jerrol Banana., MD   10 months ago BPH associated with nocturia   [facility] Jerrol Banana., MD   1 year ago Essential hypertension   [facility] Jerrol Banana., MD      Future Appointments            In 2 months Jerrol Banana., MD [facility], [place]   In 4 months End, Harrell Gave, MD [facility], [place]

## 2021-08-01 ENCOUNTER — Other Ambulatory Visit: Payer: Self-pay | Admitting: Family Medicine

## 2021-08-01 DIAGNOSIS — I700 Atherosclerosis of aorta: Secondary | ICD-10-CM

## 2021-08-08 ENCOUNTER — Other Ambulatory Visit: Payer: Self-pay | Admitting: Family Medicine

## 2021-08-22 DIAGNOSIS — D649 Anemia, unspecified: Secondary | ICD-10-CM | POA: Diagnosis not present

## 2021-08-22 DIAGNOSIS — Z23 Encounter for immunization: Secondary | ICD-10-CM | POA: Diagnosis not present

## 2021-08-22 DIAGNOSIS — Z862 Personal history of diseases of the blood and blood-forming organs and certain disorders involving the immune mechanism: Secondary | ICD-10-CM | POA: Diagnosis not present

## 2021-08-26 DIAGNOSIS — Z23 Encounter for immunization: Secondary | ICD-10-CM | POA: Diagnosis not present

## 2021-09-03 DIAGNOSIS — M5116 Intervertebral disc disorders with radiculopathy, lumbar region: Secondary | ICD-10-CM | POA: Diagnosis not present

## 2021-09-04 DIAGNOSIS — M5417 Radiculopathy, lumbosacral region: Secondary | ICD-10-CM | POA: Diagnosis not present

## 2021-09-24 ENCOUNTER — Other Ambulatory Visit: Payer: Self-pay | Admitting: Internal Medicine

## 2021-10-02 ENCOUNTER — Telehealth: Payer: Self-pay | Admitting: Internal Medicine

## 2021-10-02 NOTE — Telephone Encounter (Signed)
Called and spoke with pt.  Notified of Dr. Darnelle Bos recc below.  Pt voiced understanding.  Pt will retry taking furosemide as needed for continued edema.  He will ensure he is restricting sodium intake, elevating legs, and will start wearing his compression stockings.  Pt will let us know if symptoms worsen and we will schedule an office visit sooner than scheduled.  Pt has no further questions/ concerns at this time.

## 2021-10-02 NOTE — Telephone Encounter (Signed)
Pt c/o medication issue:  1. Name of Medication: furosemide   2. How are you currently taking this medication (dosage and times per day)? Was taking 20 mg po q d   3. Are you having a reaction (difficulty breathing--STAT)? Cramps in legs    4. What is your medication issue? Patient was taking daily for swelling but stopped about a month ago due to leg cramping patient states legs are still swelling

## 2021-10-02 NOTE — Telephone Encounter (Signed)
I agree with elevation of legs and use of compression stockings as well as sodium restriction.  It is reasonable to use as needed furosemide for continued edema.  If symptoms worsen, patient should let us know so he can be evaluated in the office.  Nelva Bush, MD [facility]

## 2021-10-02 NOTE — Telephone Encounter (Signed)
Spoke with pt.  Pt c/o swelling in both legs. Swelling is minimal in the morning when he first wakes, then swelling incr throughout the day and by the evening when he presses leg with finger it does leave an indention.  Swelling is the same as previously when we last spoke 06/21/21.  Pt denies shortness of breath or any additional s/s. Pt does not check weight daily.   Per telephone note 06/21/21 pt started Lasix 20 mg daily PRN for leg swelling.  Pt states that he took daily for approximately 1 week then developed severe cramping in legs.  Pt did not restart taking d/t cramps.  Pt states that he did not notice any improvement in leg swelling after ~1 week of taking Lasix 20 mg daily.  Swelling is below knees only equally in both legs.   Pt did have CMET 08/22/21 with creat and  K+ WNL.  Advised pt to keep legs elevated when he is sitting.  Pt does not wear compression socks.  Advised pt that I will make Dr. Saunders Revel aware and will call him with further recc.

## 2021-10-03 ENCOUNTER — Ambulatory Visit: Payer: Medicare Other | Admitting: Family Medicine

## 2021-10-08 ENCOUNTER — Ambulatory Visit: Payer: Medicare Other | Admitting: Family Medicine

## 2021-10-09 ENCOUNTER — Other Ambulatory Visit: Payer: Self-pay | Admitting: Neurological Surgery

## 2021-10-09 DIAGNOSIS — M48062 Spinal stenosis, lumbar region with neurogenic claudication: Secondary | ICD-10-CM

## 2021-10-10 ENCOUNTER — Telehealth: Payer: Self-pay | Admitting: Family Medicine

## 2021-10-10 DIAGNOSIS — G8929 Other chronic pain: Secondary | ICD-10-CM

## 2021-10-10 MED ORDER — OXYCODONE HCL 15 MG PO TABS: 15.0000 mg | ORAL_TABLET | ORAL | 0 refills | Status: DC | PRN

## 2021-10-10 NOTE — Telephone Encounter (Signed)
Last refill: 06/12/21 #120  0 refills  Last office visit:06/12/2021   Future visit scheduled 12/18/21

## 2021-10-10 NOTE — Telephone Encounter (Signed)
Medication Refill - Medication: Oxycodone   Has the patient contacted their pharmacy? No , He usually picks up his prescription from the office  (Agent: If no, request that the patient contact the pharmacy for the refill. If patient does not wish to contact the pharmacy document the reason why and proceed with request.) (Agent: If yes, when and what did the pharmacy advise?)  Preferred Pharmacy (with phone number or street name): [facility] in [place] Has the patient been seen for an appointment in the last year OR does the patient have an upcoming appointment? Yes.    Agent: Please be advised that RX refills may take up to 3 business days. We ask that you follow-up with your pharmacy.

## 2021-10-10 NOTE — Telephone Encounter (Signed)
Requested medication (s) are due for refill today: yes  Requested medication (s) are on the active medication list: yes    Last refill: 06/12/21 #120  0 refills  Future visit scheduled yes 12/18/21  Notes to clinic:Not delegated  Requested Prescriptions  Pending Prescriptions Disp Refills   oxyCODONE (ROXICODONE) 15 MG immediate release tablet 120 tablet 0    Sig: Take 1 tablet (15 mg total) by mouth every 4 (four) hours as needed for pain.     Not Delegated - Analgesics:  Opioid Agonists Failed - 10/10/2021 11:52 AM      Failed - This refill cannot be delegated      Failed - Urine Drug Screen completed in last 360 days      Passed - Valid encounter within last 6 months    Recent Outpatient Visits           4 months ago Other chronic pain   [facility] Jerrol Banana., MD   7 months ago BPH associated with nocturia   [facility] Jerrol Banana., MD   10 months ago Essential hypertension   [facility] Jerrol Banana., MD   1 year ago BPH associated with nocturia   [facility] Jerrol Banana., MD   1 year ago Essential hypertension   [facility] Jerrol Banana., MD       Future Appointments             In 1 month End, Harrell Gave, MD [facility], [facility]   In 2 months Jerrol Banana., MD [facility], [place]

## 2021-10-15 ENCOUNTER — Other Ambulatory Visit: Payer: Self-pay

## 2021-10-15 DIAGNOSIS — I700 Atherosclerosis of aorta: Secondary | ICD-10-CM

## 2021-10-15 DIAGNOSIS — N401 Enlarged prostate with lower urinary tract symptoms: Secondary | ICD-10-CM

## 2021-10-15 MED ORDER — CELECOXIB 200 MG PO CAPS: 200.0000 mg | ORAL_CAPSULE | Freq: Every day | ORAL | 2 refills | Status: DC

## 2021-10-15 MED ORDER — METOPROLOL SUCCINATE ER 25 MG PO TB24: 25.0000 mg | ORAL_TABLET | Freq: Every day | ORAL | 1 refills | Status: DC

## 2021-10-15 MED ORDER — LOSARTAN POTASSIUM 50 MG PO TABS: ORAL_TABLET | ORAL | 1 refills | Status: DC

## 2021-10-15 MED ORDER — OMEPRAZOLE 20 MG PO CPDR: 20.0000 mg | DELAYED_RELEASE_CAPSULE | Freq: Every day | ORAL | 1 refills | Status: AC

## 2021-10-15 MED ORDER — ROSUVASTATIN CALCIUM 5 MG PO TABS: ORAL_TABLET | ORAL | 1 refills | Status: AC

## 2021-10-15 MED ORDER — FUROSEMIDE 20 MG PO TABS: 20.0000 mg | ORAL_TABLET | Freq: Every day | ORAL | 2 refills | Status: DC | PRN

## 2021-10-15 MED ORDER — TAMSULOSIN HCL 0.4 MG PO CAPS: 0.4000 mg | ORAL_CAPSULE | Freq: Every day | ORAL | 1 refills | Status: DC

## 2021-10-15 NOTE — Telephone Encounter (Signed)
Pt wants to speak to the clinic about future refills, he wants to know what to expect. Please advise  Best contact: [phone]

## 2021-10-15 NOTE — Telephone Encounter (Signed)
[facility] [place]. I have sent his regular medications and advised patient to call back for oxycodone and lyrica when those are due.

## 2021-10-25 ENCOUNTER — Other Ambulatory Visit: Payer: Self-pay | Admitting: Family Medicine

## 2021-10-25 ENCOUNTER — Ambulatory Visit: Payer: Self-pay

## 2021-10-25 DIAGNOSIS — J309 Allergic rhinitis, unspecified: Secondary | ICD-10-CM

## 2021-10-25 MED ORDER — FLUTICASONE PROPIONATE 50 MCG/ACT NA SUSP: 2.0000 | Freq: Every day | NASAL | 3 refills | Status: DC

## 2021-10-25 NOTE — Telephone Encounter (Signed)
Please review for Dr. Gilbert  Thanks,   -Daniel Owens  

## 2021-10-25 NOTE — Telephone Encounter (Signed)
Copied from [place] [id number]. Topic: Quick Communication - Rx Refill/Question >> Oct 25, 2021 11:34 AM Yvette Rack wrote: Medication: fluticasone (FLONASE) 50 MCG/ACT nasal spray  Has the patient contacted their pharmacy? No. (Agent: If no, request that the patient contact the pharmacy for the refill. If patient does not wish to contact the pharmacy document the reason why and proceed with request.) (Agent: If yes, when and what did the pharmacy advise?)  Preferred Pharmacy (with phone number or street name[id number] [place], [place] [address]  Phone: [phone] Fax: [phone]  Has the patient been seen for an appointment in the last year OR does the patient have an upcoming appointment? Yes.    Agent: Please be advised that RX refills may take up to 3 business days. We ask that you follow-up with your pharmacy.

## 2021-10-25 NOTE — Telephone Encounter (Signed)
Patient called due to Flonase is not on his current medication list. He says he had it before but ran out. He says he's sick with sinus congestion/headache and needs the refill. See Nurse Triage encounter.

## 2021-10-25 NOTE — Telephone Encounter (Signed)
Chief Complaint: Sinus congestion Symptoms: headache, cough, sore throat, greenish-clear mucus Frequency: Yesterday onset Pertinent Negatives: Patient denies fever (hasn't checked), COVID (hasn't checked) Disposition: [] ED /[] Urgent Care (no appt availability in office) / [] Appointment(In office/virtual)/ []  [facility]/ [] Home Care/ [x] Refused Recommended Disposition /[] [facility]/ []  Follow-up with PCP Additional Notes: Patient advised he will need a virtual UC visit, he says he doesn't have time for that, if he could just get the flonase he requested, that would help. If he gets worse, he says he will call back.    Reason for Disposition  [1] Sinus pain (not just congestion) AND [2] fever  Answer Assessment - Initial Assessment Questions 1. LOCATION: "Where does it hurt?"      Headache 2. ONSET: "When did the sinus pain start?"  (e.g., hours, days)      Yesterday 3. SEVERITY: "How bad is the pain?"   (Scale 1-10; mild, moderate or severe)   - MILD (1-3): doesn't interfere with normal activities    - MODERATE (4-7): interferes with normal activities (e.g., work or school) or awakens from sleep   - SEVERE (8-10): excruciating pain and patient unable to do any normal activities        5 4. RECURRENT SYMPTOM: "Have you ever had sinus problems before?" If Yes, ask: "When was the last time?" and "What happened that time?"      Yes 5. NASAL CONGESTION: "Is the nose blocked?" If Yes, ask: "Can you open it or must you breathe through your mouth?"     No, afrin has helped 6. NASAL DISCHARGE: "Do you have discharge from your nose?" If so ask, "What color?"     Green and clear 7. FEVER: "Do you have a fever?" If Yes, ask: "What is it, how was it measured, and when did it start?"      No 8. OTHER SYMPTOMS: "Do you have any other symptoms?" (e.g., sore throat, cough, earache, difficulty breathing)     Sore throat, runny nose, cough 9. PREGNANCY: "Is there any chance you  are pregnant?" "When was your last menstrual period?"     N/A  Protocols used: Sinus Pain or Congestion-A-AH

## 2021-10-29 ENCOUNTER — Ambulatory Visit
Admission: RE | Admit: 2021-10-29 | Discharge: 2021-10-29 | Disposition: A | Discharge status: Home / Self Care | Payer: Medicare Other | Source: Ambulatory Visit | Attending: Neurological Surgery | Admitting: Neurological Surgery

## 2021-10-29 ENCOUNTER — Other Ambulatory Visit: Payer: Self-pay

## 2021-10-29 DIAGNOSIS — M5126 Other intervertebral disc displacement, lumbar region: Secondary | ICD-10-CM | POA: Diagnosis not present

## 2021-10-29 DIAGNOSIS — M48061 Spinal stenosis, lumbar region without neurogenic claudication: Secondary | ICD-10-CM | POA: Diagnosis not present

## 2021-10-29 DIAGNOSIS — M48062 Spinal stenosis, lumbar region with neurogenic claudication: Secondary | ICD-10-CM | POA: Insufficient documentation

## 2021-10-29 MED ORDER — GADOBUTROL 1 MMOL/ML IV SOLN
7.5000 mL | Freq: Once | INTRAVENOUS | Status: AC | PRN
  Administered 2021-10-29: 7.5 mL via INTRAVENOUS

## 2021-10-30 DIAGNOSIS — S22000A Wedge compression fracture of unspecified thoracic vertebra, initial encounter for closed fracture: Secondary | ICD-10-CM | POA: Diagnosis not present

## 2021-11-01 ENCOUNTER — Other Ambulatory Visit: Payer: Self-pay | Admitting: Family Medicine

## 2021-11-01 DIAGNOSIS — G8929 Other chronic pain: Secondary | ICD-10-CM

## 2021-11-01 MED ORDER — OXYCODONE HCL 15 MG PO TABS: 15.0000 mg | ORAL_TABLET | ORAL | 0 refills | Status: DC | PRN

## 2021-11-01 NOTE — Telephone Encounter (Signed)
Medication Refill - Medication: Oxycodone 15 mg  Has the patient contacted their pharmacy? No.  He is asking early because he has a surgery on Thursday  (Agent: If no, request that the patient contact the pharmacy for the refill. If patient does not wish to contact the pharmacy document the reason why and proceed with request.) (Agent: If yes, when and what did the pharmacy advise?)  Preferred Pharm[facility] (with phone number [facility]  Has the patient been seen for an appointment in the last year OR does the patient have an upcoming appointment? Yes.    Agent: Please be advised that RX refills may take up to 3 business days. We ask that you follow-up with your pharmacy.

## 2021-11-01 NOTE — Addendum Note (Signed)
Addended by: Birdie Sons on: 11/01/2021 05:08 PM   Modules accepted: Orders

## 2021-11-01 NOTE — Telephone Encounter (Signed)
LOV: 06/12/2021   Thanks,   -Mickel Baas

## 2021-11-01 NOTE — Telephone Encounter (Signed)
Requested medication (s) are due for refill today:  Provider to review.   Pt requesting early because he has surgery coming up on Thursday.  Requested medication (s) are on the active medication list:   Future visit scheduled:   yes   Last ordered: 07/09/2021 #120, 0 refills  Non delegated refill    Requested Prescriptions  Pending Prescriptions Disp Refills   oxyCODONE (ROXICODONE) 15 MG immediate release tablet 120 tablet 0    Sig: Take 1 tablet (15 mg total) by mouth every 4 (four) hours as needed for pain.     Not Delegated - Analgesics:  Opioid Agonists Failed - 11/01/2021  3:13 PM      Failed - This refill cannot be delegated      Failed - Urine Drug Screen completed in last 360 days      Passed - Valid encounter within last 6 months    Recent Outpatient Visits           4 months ago Other chronic pain   [facility] Jerrol Banana., MD   7 months ago BPH associated with nocturia   [facility] Jerrol Banana., MD   10 months ago Essential hypertension   [facility] Jerrol Banana., MD   1 year ago BPH associated with nocturia   [facility] Jerrol Banana., MD   1 year ago Essential hypertension   [facility] Jerrol Banana., MD       Future Appointments             In 1 month End, Harrell Gave, MD [facility], [facility]   In 1 month Jerrol Banana., MD [facility], [place]

## 2021-11-07 DIAGNOSIS — S32010G Wedge compression fracture of first lumbar vertebra, subsequent encounter for fracture with delayed healing: Secondary | ICD-10-CM | POA: Diagnosis not present

## 2021-11-07 DIAGNOSIS — X58XXXA Exposure to other specified factors, initial encounter: Secondary | ICD-10-CM | POA: Diagnosis not present

## 2021-11-07 DIAGNOSIS — Y929 Unspecified place or not applicable: Secondary | ICD-10-CM | POA: Diagnosis not present

## 2021-11-07 DIAGNOSIS — S22080A Wedge compression fracture of T11-T12 vertebra, initial encounter for closed fracture: Secondary | ICD-10-CM | POA: Diagnosis not present

## 2021-11-07 DIAGNOSIS — M8008XG Age-related osteoporosis with current pathological fracture, vertebra(e), subsequent encounter for fracture with delayed healing: Secondary | ICD-10-CM | POA: Diagnosis not present

## 2021-11-07 DIAGNOSIS — S22080G Wedge compression fracture of T11-T12 vertebra, subsequent encounter for fracture with delayed healing: Secondary | ICD-10-CM | POA: Diagnosis not present

## 2021-11-07 DIAGNOSIS — S22000A Wedge compression fracture of unspecified thoracic vertebra, initial encounter for closed fracture: Secondary | ICD-10-CM | POA: Diagnosis not present

## 2021-11-08 ENCOUNTER — Telehealth: Payer: Self-pay | Admitting: Family Medicine

## 2021-11-08 DIAGNOSIS — Z87310 Personal history of (healed) osteoporosis fracture: Secondary | ICD-10-CM

## 2021-11-08 DIAGNOSIS — M8448XA Pathological fracture, other site, initial encounter for fracture: Secondary | ICD-10-CM

## 2021-11-08 NOTE — Telephone Encounter (Signed)
That's fine, they can dispense today.

## 2021-11-08 NOTE — Telephone Encounter (Signed)
Please advise 

## 2021-11-08 NOTE — Telephone Encounter (Signed)
Referral Request - Has patient seen PCP for this complaint? No. *If NO, is insurance requiring patient see PCP for this issue before PCP can refer them? Referral for which specialty: bone density Preferred provider/office: [place]  Reason for referral: pt had back surgery yesterday, and his dr mentioned with recent fractures he may have osteoporosis. Dr advised him to get a bone density as soon as possible

## 2021-11-08 NOTE — Telephone Encounter (Signed)
[facility] for the insurance to cover.  I submitted a PA though Cover My Meds.    Thanks,   -Mickel Baas

## 2021-11-08 NOTE — Telephone Encounter (Incomplete Revision)
Pt has switched pharmacies (due to insurance) and his oxyCODONE (ROXICODONE) 15 MG immediate release tablet   [facility]  [facility]. The pharmacist told him that Dr Caryn Section needs to call today to authorize his RX to be filled, or they will only dispense a 7 day supply and he will lose the rest. please call  [id number] [place], [place] - [address]  Pt just had another back surgery yesterday, and he cannot go without his medications. Pt is really worried he will not get his prescription filled for 30 days.

## 2021-11-08 NOTE — Telephone Encounter (Addendum)
Pt has switched pharmacies (due to insurance) and his oxyCODONE (ROXICODONE) 15 MG immediate release tablet   [facility]  [facility]. The pharmacist told him that Dr Caryn Section needs to call today to authorize his RX to be filled, or they will only dispense a 7 day supply and he will lose the rest. please call  [id number] [place], [place] - [address]  Pt just had another back surgery yesterday, and he cannot go without his medications. Pt is really worried he will not get his prescription filled for 30 days.

## 2021-11-28 DIAGNOSIS — X32XXXA Exposure to sunlight, initial encounter: Secondary | ICD-10-CM | POA: Diagnosis not present

## 2021-11-28 DIAGNOSIS — Z85828 Personal history of other malignant neoplasm of skin: Secondary | ICD-10-CM | POA: Diagnosis not present

## 2021-11-28 DIAGNOSIS — D2262 Melanocytic nevi of left upper limb, including shoulder: Secondary | ICD-10-CM | POA: Diagnosis not present

## 2021-11-28 DIAGNOSIS — L570 Actinic keratosis: Secondary | ICD-10-CM | POA: Diagnosis not present

## 2021-11-28 DIAGNOSIS — D2261 Melanocytic nevi of right upper limb, including shoulder: Secondary | ICD-10-CM | POA: Diagnosis not present

## 2021-11-28 DIAGNOSIS — L821 Other seborrheic keratosis: Secondary | ICD-10-CM | POA: Diagnosis not present

## 2021-11-28 DIAGNOSIS — D225 Melanocytic nevi of trunk: Secondary | ICD-10-CM | POA: Diagnosis not present

## 2021-12-05 ENCOUNTER — Other Ambulatory Visit: Payer: Self-pay

## 2021-12-05 ENCOUNTER — Ambulatory Visit (INDEPENDENT_AMBULATORY_CARE_PROVIDER_SITE_OTHER): Payer: Medicare Other | Admitting: Internal Medicine

## 2021-12-05 ENCOUNTER — Encounter: Payer: Self-pay | Admitting: Internal Medicine

## 2021-12-05 VITALS — BP 130/52 | HR 57 | Ht 72.0 in | Wt 151.0 lb

## 2021-12-05 DIAGNOSIS — I700 Atherosclerosis of aorta: Secondary | ICD-10-CM

## 2021-12-05 DIAGNOSIS — I10 Essential (primary) hypertension: Secondary | ICD-10-CM | POA: Diagnosis not present

## 2021-12-05 DIAGNOSIS — I77810 Thoracic aortic ectasia: Secondary | ICD-10-CM

## 2021-12-05 DIAGNOSIS — M7989 Other specified soft tissue disorders: Secondary | ICD-10-CM

## 2021-12-05 DIAGNOSIS — I779 Disorder of arteries and arterioles, unspecified: Secondary | ICD-10-CM

## 2021-12-05 NOTE — Patient Instructions (Signed)
Medication Instructions:   Your physician recommends that you continue on your current medications as directed. Please refer to the Current Medication list given to you today.  *If you need a refill on your cardiac medications before your next appointment, please call your pharmacy*   Lab Work:  None ordered  Testing/Procedures:  None ordered   Follow-Up: At [facility], you and your health needs are our priority.  As part of our continuing mission to provide you with exceptional heart care, we have created designated Provider Care Teams.  These Care Teams include your primary Cardiologist (physician) and Advanced Practice Providers (APPs -  Physician Assistants and Nurse Practitioners) who all work together to provide you with the care you need, when you need it.  We recommend signing up for the patient portal called "MyChart".  Sign up information is provided on this After Visit Summary.  MyChart is used to connect with patients for Virtual Visits (Telemedicine).  Patients are able to view lab/test results, encounter notes, upcoming appointments, etc.  Non-urgent messages can be sent to your provider as well.   To learn more about what you can do with MyChart, go to [email or web address].    Your next appointment:   1 year(s)  The format for your next appointment:   In Person  Provider:   You may see Nelva Bush, MD or one of the following Advanced Practice Providers on your designated Care Team:   Murray Hodgkins, NP Christell Faith, PA-C Cadence Kathlen Mody, [place]

## 2021-12-05 NOTE — Progress Notes (Signed)
Follow-up Outpatient Visit Date: 12/05/2021  Primary Care Provider: Jerrol Banana., MD [address] [place] [address]  Chief Complaint: Follow-up hypertension,  HPI:  Mr. Silva is a 76 y.o. male with history of hypertension, mild carotid artery disease, mildly dilated thoracic aorta, palpitations, and systolic heart murmur, who presents for follow-up of hypertension.  I last saw him in August, at which time he was doing fairly well.  He noted some low diastolic readings without associated symptoms.  We did not make any medication changes.  Due to leg swelling, echocardiogram was ordered.  This showed LVEF of 55-60% with normal diastolic function.  No significant valvular abnormality was seen.  CVP was elevated.  There was also mild dilation of the aortic root.  Addition of furosemide 20 mg daily was recommended.  He took this for about a week but then stopped it due to leg cramping.  He reached out to our office complaining of increasing dependent edema in December.  He was advised to use compression stockings and elevate his legs when possible.  Today, Mr. Larch is bothered mostly by back pain.  He had a compression fracture and underwent kyphoplasty.  He is not sure that his back pain has improved much.  He has otherwise been feeling fairly well without chest pain, shortness of breath, palpitations, or lightheadedness.  Leg swelling is stable; he did not notice much improvement while on the brief course of furosemide that was stopped due to leg cramps.  He has not been checking his blood pressure regularly at home.  He inquires about echo finding of mildly dilated aortic root.  --------------------------------------------------------------------------------------------------  Past Medical History:  Diagnosis Date   Abnormal stress test    a. 07/2017 ETT: Ex 10:15, no chest pain, HTN response, 27mm ST dep in II, II, aVF, V4-V6 - returned to baseline in 5  mins-->Intermediate risk study. b. Cardiac CTA normal without CAD (08/2017)   Aortic atherosclerosis ([place])    a. Incidentally noted on Chest CT in 2016.   Carotid arterial disease ([place])    a. 07/2014 carotid u/s: mild <50%, bilat ICA stenosis.   Hyperlipidemia    Hypertension    Macrocytic anemia    osteoarthritis    Palpitations    a. ? prior dx of PAF.   Raynauds syndrome    Systolic murmur    a. Reports h/o rheumatic fever as a child;  b. 07/2017 Echo: EF 55-60%, no rwma, triv AI, nl RV fxn.   Past Surgical History:  Procedure Laterality Date   APPENDECTOMY     BACK SURGERY     January 2023   BILATERAL KNEE ARTHROSCOPY     HERNIA REPAIR     NECK SURGERY     involving disc   NOSE SURGERY     RETINAL DETACHMENT SURGERY     SHOULDER SURGERY     Right-x 3 overall   TENDON REPAIR     involving Tricep tendon    Current Meds  Medication Sig   Ascorbic Acid (VITAMIN C) 1000 MG tablet Take 1,000 mg by mouth daily.   B Complex Vitamins (VITAMIN B COMPLEX PO) Take 1 tablet by mouth daily.   beta carotene 25000 UNIT capsule Take 25,000 Units by mouth daily.   celecoxib (CELEBREX) 200 MG capsule Take 1 capsule (200 mg total) by mouth daily.   fish oil-omega-3 fatty acids 1000 MG capsule Take 1 g by mouth daily.   fluticasone (FLONASE) 50 MCG/ACT  nasal spray Place 2 sprays into both nostrils daily.   folic acid (FOLVITE) 203 MCG tablet Take 1 tablet (400 mcg total) by mouth daily.   furosemide (LASIX) 20 MG tablet Take 1 tablet (20 mg total) by mouth daily as needed (leg swelling).   GLUCOSAMINE-CHONDROIT-VIT C-MN PO Take by mouth daily.    losartan (COZAAR) 50 MG tablet TAKE 1 TABLET(50 MG) BY MOUTH DAILY   metoprolol succinate (TOPROL-XL) 25 MG 24 hr tablet Take 1 tablet (25 mg total) by mouth daily.   Multiple Vitamin (MULTIVITAMIN WITH MINERALS) TABS tablet Take 1 tablet by mouth daily.   omeprazole (PRILOSEC) 20 MG capsule Take 1 capsule (20 mg total) by mouth daily.    oxyCODONE (ROXICODONE) 15 MG immediate release tablet Take 1 tablet (15 mg total) by mouth every 4 (four) hours as needed for pain.   oxyCODONE (ROXICODONE) 15 MG immediate release tablet Take 1 tablet (15 mg total) by mouth every 4 (four) hours as needed for pain.   pregabalin (LYRICA) 75 MG capsule Take 1 capsule (75 mg total) by mouth 2 (two) times daily.   rosuvastatin (CRESTOR) 5 MG tablet TAKE 1 TABLET(5 MG) BY MOUTH DAILY   Selenium 200 MCG TABS Take 200 mcg by mouth daily.   tamsulosin (FLOMAX) 0.4 MG CAPS capsule Take 1 capsule (0.4 mg total) by mouth daily.   Zinc 50 MG CAPS Take 1 capsule by mouth daily.    Allergies: Patient has no known allergies.  Social History   Tobacco Use   Smoking status: Former    Types: Cigarettes    Quit date: 10/19/1977    Years since quitting: 44.1   Smokeless tobacco: Current    Types: Chew   Tobacco comments:    trying to quit  Vaping Use   Vaping Use: Never used  Substance Use Topics   Alcohol use: Not Currently    Comment: very seldom- wine   Drug use: No    Family History  Problem Relation Age of Onset   Stroke Mother    Uterine cancer Mother    Throat cancer Father    Allergic rhinitis Brother    Prostate cancer Maternal Uncle    Prostate cancer Paternal Uncle    Heart attack Maternal Grandmother    Heart attack Maternal Grandfather     Review of Systems: A 12-system review of systems was performed and was negative except as noted in the HPI.  --------------------------------------------------------------------------------------------------  Physical Exam: BP (!) 130/52 (BP Location: Left Arm, Patient Position: Sitting, Cuff Size: Normal)    Pulse (!) 57    Ht 6' (1.829 m)    Wt 151 lb (68.5 kg)    SpO2 98%    BMI 20.48 kg/m   General:  NAD. Neck: No JVD or HJR. Lungs: Clear to auscultation bilaterally without wheezes or crackles. Heart: Bradycardic but regular with 1/6 systolic murmur.  No rubs or  gallops.. Abdomen: Soft, nontender, nondistended. Extremities: Trace pretibial edema bilaterally.  EKG: Sinus bradycardia.  No significant abnormality or change from prior tracing on 02/20/2021.  Lab Results  Component Value Date   WBC 8.1 09/03/2020   HGB 12.5 (L) 09/03/2020   HCT 36.9 (L) 09/03/2020   MCV 104.8 (H) 09/03/2020   PLT 185 09/03/2020    Lab Results  Component Value Date   NA 137 01/14/2021   K 3.7 01/14/2021   CL 101 01/14/2021   CO2 28 01/14/2021   BUN 23 01/14/2021   CREATININE 1.08 01/14/2021  GLUCOSE 96 01/14/2021   ALT 14 06/19/2020    Lab Results  Component Value Date   CHOL 167 06/19/2020   HDL 84 06/19/2020   LDLCALC 67 06/19/2020   TRIG 87 06/19/2020   CHOLHDL 2.0 06/19/2020    --------------------------------------------------------------------------------------------------  ASSESSMENT AND PLAN: Hypertension: Blood pressure upper normal today.  Continue current medications.  Dilated aortic root and aortic atherosclerosis: Mildly dilated aortic root noted on echocardiogram in August.  Allowing for differences in technique, there has been no significant change from coronary CTA in 2018.  We will continue medical therapy to prevent progression, particularly blood pressure and lipid control.  Leg swelling: Minimal edema noted on exam today.  Mr. Eichel was intolerant of furosemide due to leg cramps.  I think it is okay to defer further diuretic therapy.  Mr. Hillery should continue to elevate his legs when possible and use compression as tolerated (he is currently unable to apply compression stockings due to back pain related to his recent compression fracture).  Follow-up: Return to clinic in 1 year.  Nelva Bush, MD 12/05/2021 3:07 PM

## 2021-12-06 ENCOUNTER — Other Ambulatory Visit: Payer: Self-pay | Admitting: Family Medicine

## 2021-12-06 ENCOUNTER — Encounter: Payer: Self-pay | Admitting: Internal Medicine

## 2021-12-06 DIAGNOSIS — R030 Elevated blood-pressure reading, without diagnosis of hypertension: Secondary | ICD-10-CM | POA: Diagnosis not present

## 2021-12-06 DIAGNOSIS — S22000A Wedge compression fracture of unspecified thoracic vertebra, initial encounter for closed fracture: Secondary | ICD-10-CM | POA: Diagnosis not present

## 2021-12-06 DIAGNOSIS — M7989 Other specified soft tissue disorders: Secondary | ICD-10-CM | POA: Insufficient documentation

## 2021-12-06 DIAGNOSIS — G8929 Other chronic pain: Secondary | ICD-10-CM

## 2021-12-06 NOTE — Telephone Encounter (Signed)
Medication Refill - Medication: Hydrocodone 15 mg  Has the patient contacted their pharmacy? No. (Agent: If no, request that the patient contact then pharmacy for the refill. If patient does not wish to contact the pharmacy document the reason why and proceed with request.) (Agent: If yes, when and what did the pharmacy advise?)  Preferred Pharmacy (with phone number or street name): [facility]  Has the patient been seen for an appointment in the last year OR does the patient have an upcoming appointment? Yes.    Agent: Please be advised that RX refills may take up to 3 business days. We ask that you follow-up with your pharmacy.

## 2021-12-06 NOTE — Telephone Encounter (Signed)
Requested medication (s) are due for refill today: yes  Requested medication (s) are on the active medication list: yes  Last refill:  11/01/21 #120  Future visit scheduled: yes  Notes to clinic:  Please review for refill. Refill not delegated per protocol.     Requested Prescriptions  Pending Prescriptions Disp Refills   oxyCODONE (ROXICODONE) 15 MG immediate release tablet 120 tablet 0    Sig: Take 1 tablet (15 mg total) by mouth every 4 (four) hours as needed for pain.     Not Delegated - Analgesics:  Opioid Agonists Failed - 12/06/2021  2:22 PM      Failed - This refill cannot be delegated      Failed - Urine Drug Screen completed in last 360 days      Failed - Valid encounter within last 3 months    Recent Outpatient Visits           5 months ago Other chronic pain   [facility] Jerrol Banana., MD   8 months ago BPH associated with nocturia   [facility] Jerrol Banana., MD   11 months ago Essential hypertension   [facility] Jerrol Banana., MD   1 year ago BPH associated with nocturia   [facility] Jerrol Banana., MD   1 year ago Essential hypertension   [facility] Jerrol Banana., MD       Future Appointments             In 1 week Jerrol Banana., MD [facility], [place]

## 2021-12-10 ENCOUNTER — Other Ambulatory Visit: Payer: Self-pay

## 2021-12-10 DIAGNOSIS — G8929 Other chronic pain: Secondary | ICD-10-CM

## 2021-12-10 MED ORDER — OXYCODONE HCL 15 MG PO TABS: 15.0000 mg | ORAL_TABLET | ORAL | 0 refills | Status: DC | PRN

## 2021-12-10 NOTE — Telephone Encounter (Signed)
Copied from [place] [id number]. Topic: General - Other >> Dec 10, 2021 10:36 AM Tessa Lerner A wrote: Reason for CRM: The patient has called for an update on previously requested prescription for oxyCODONE (ROXICODONE) 15 MG immediate release tablet [[id number]]   Please contact further when available

## 2021-12-10 NOTE — Addendum Note (Signed)
Addended by: Julieta Bellini on: 12/10/2021 12:33 PM   Modules accepted: Orders

## 2021-12-18 ENCOUNTER — Other Ambulatory Visit: Payer: Self-pay

## 2021-12-18 ENCOUNTER — Encounter: Payer: Self-pay | Admitting: Family Medicine

## 2021-12-18 ENCOUNTER — Ambulatory Visit (INDEPENDENT_AMBULATORY_CARE_PROVIDER_SITE_OTHER): Payer: Medicare Other | Admitting: Family Medicine

## 2021-12-18 VITALS — BP 126/70 | HR 57 | Temp 98.4°F | Ht 72.0 in | Wt 155.6 lb

## 2021-12-18 DIAGNOSIS — I10 Essential (primary) hypertension: Secondary | ICD-10-CM | POA: Diagnosis not present

## 2021-12-18 DIAGNOSIS — N401 Enlarged prostate with lower urinary tract symptoms: Secondary | ICD-10-CM

## 2021-12-18 DIAGNOSIS — E7800 Pure hypercholesterolemia, unspecified: Secondary | ICD-10-CM | POA: Diagnosis not present

## 2021-12-18 DIAGNOSIS — G8929 Other chronic pain: Secondary | ICD-10-CM | POA: Diagnosis not present

## 2021-12-18 DIAGNOSIS — I779 Disorder of arteries and arterioles, unspecified: Secondary | ICD-10-CM | POA: Diagnosis not present

## 2021-12-18 DIAGNOSIS — R351 Nocturia: Secondary | ICD-10-CM

## 2021-12-18 DIAGNOSIS — M5010 Cervical disc disorder with radiculopathy, unspecified cervical region: Secondary | ICD-10-CM

## 2021-12-18 DIAGNOSIS — M5441 Lumbago with sciatica, right side: Secondary | ICD-10-CM

## 2021-12-18 DIAGNOSIS — D649 Anemia, unspecified: Secondary | ICD-10-CM

## 2021-12-18 DIAGNOSIS — S32000A Wedge compression fracture of unspecified lumbar vertebra, initial encounter for closed fracture: Secondary | ICD-10-CM | POA: Diagnosis not present

## 2021-12-18 DIAGNOSIS — M5442 Lumbago with sciatica, left side: Secondary | ICD-10-CM

## 2021-12-18 MED ORDER — OXYCODONE HCL 15 MG PO TABS: 15.0000 mg | ORAL_TABLET | ORAL | 0 refills | Status: DC | PRN

## 2021-12-18 MED ORDER — OXYCODONE HCL 15 MG PO TABS
15.0000 mg | ORAL_TABLET | ORAL | 0 refills | Status: DC | PRN
Start: 2021-12-18 — End: 2022-04-08

## 2021-12-18 MED ORDER — OXYCODONE HCL 15 MG PO TABS
15.0000 mg | ORAL_TABLET | ORAL | 0 refills | Status: DC | PRN
Start: 2021-12-18 — End: 2022-04-14

## 2021-12-18 NOTE — Progress Notes (Signed)
? ?I,Elena D DeSanto,acting as a scribe for [name], MD.,have documented all relevant documentation on the behalf of [name], MD,as directed by  [name], MD while in the presence of [name], MD. ?  ? ? ?Established patient visit ? ? ?Patient: Daniel Owens   DOB: 07-May-1946   76 y.o. Male  MRN: [id number] ?Visit Date: 12/18/2021 ? ?Today's healthcare provider: [name], MD  ? ?No chief complaint on file. ? ?Subjective  ?  ?HPI  ?His last visit patient has had another surgery for wedge compression fractures of the spine.  The surgeon said the spine was very soft.  MD is ordered for later this month. ?He has mild BPH symptoms and insists he needs a PSA ordered.  I told him it was not appropriate at 75 with stable symptoms. ?He is refill of the pain medications he is on.  He knows not to take more than he is prescribed. ?Follow up for Chronic Pain ? ?The patient was seen 6 months ago for chronic pain.  No changes in management since last visit. He reports good compliance and tolerance of his pain.  He reports his condition is unchanged. ?He takes Oxycodone 15 mg every 4 hours for control of his pain. ? ?Patient also has neuropathy and for this on his last visit he was started on gabapentin in the mornings.   ? ?--------------------------------------------------------------------------------------------------- ? ? ?Medications: ?Outpatient Medications Prior to Visit  ?Medication Sig  ? Ascorbic Acid (VITAMIN C) 1000 MG tablet Take 1,000 mg by mouth daily.  ? B Complex Vitamins (VITAMIN B COMPLEX PO) Take 1 tablet by mouth daily.  ? beta carotene 25000 UNIT capsule Take 25,000 Units by mouth daily.  ? celecoxib (CELEBREX) 200 MG capsule Take 1 capsule (200 mg total) by mouth daily.  ? fish oil-omega-3 fatty acids 1000 MG capsule Take 1 g by mouth daily.  ? fluticasone (FLONASE) 50 MCG/ACT nasal spray Place 2 sprays into both nostrils daily.  ? folic acid (FOLVITE) 320  MCG tablet Take 1 tablet (400 mcg total) by mouth daily.  ? GLUCOSAMINE-CHONDROIT-VIT C-MN PO Take by mouth daily.   ? losartan (COZAAR) 50 MG tablet TAKE 1 TABLET(50 MG) BY MOUTH DAILY  ? metoprolol succinate (TOPROL-XL) 25 MG 24 hr tablet Take 1 tablet (25 mg total) by mouth daily.  ? Multiple Vitamin (MULTIVITAMIN WITH MINERALS) TABS tablet Take 1 tablet by mouth daily.  ? omeprazole (PRILOSEC) 20 MG capsule Take 1 capsule (20 mg total) by mouth daily.  ? oxyCODONE (ROXICODONE) 15 MG immediate release tablet Take 1 tablet (15 mg total) by mouth every 4 (four) hours as needed for pain.  ? oxyCODONE (ROXICODONE) 15 MG immediate release tablet Take 1 tablet (15 mg total) by mouth every 4 (four) hours as needed for pain.  ? pregabalin (LYRICA) 75 MG capsule Take 1 capsule (75 mg total) by mouth 2 (two) times daily.  ? rosuvastatin (CRESTOR) 5 MG tablet TAKE 1 TABLET(5 MG) BY MOUTH DAILY  ? Selenium 200 MCG TABS Take 200 mcg by mouth daily.  ? tamsulosin (FLOMAX) 0.4 MG CAPS capsule Take 1 capsule (0.4 mg total) by mouth daily.  ? Zinc 50 MG CAPS Take 1 capsule by mouth daily.  ? ?No facility-administered medications prior to visit.  ? ? ?Review of Systems ? ?Last metabolic panel ?Lab Results  ?Component Value Date  ? GLUCOSE 86 12/19/2021  ? NA 139 12/19/2021  ? K 4.5 12/19/2021  ?  CL 101 12/19/2021  ? CO2 26 12/19/2021  ? BUN 20 12/19/2021  ? CREATININE 1.06 12/19/2021  ? EGFR 74 12/19/2021  ? CALCIUM 9.4 12/19/2021  ? PROT 6.8 12/19/2021  ? ALBUMIN 4.3 12/19/2021  ? LABGLOB 2.5 12/19/2021  ? LABGLOB WILL FOLLOW 12/19/2021  ? AGRATIO 1.7 12/19/2021  ? AGRATIO WILL FOLLOW 12/19/2021  ? BILITOT 0.5 12/19/2021  ? ALKPHOS 59 12/19/2021  ? AST 20 12/19/2021  ? ALT 13 12/19/2021  ? ANIONGAP 8 01/14/2021  ? ?  ?  Objective  ?  ?BP 126/70 (BP Location: Left Arm, Patient Position: Sitting, Cuff Size: Normal)   Pulse (!) 57   Temp 98.4 ?F (36.9 ?C) (Oral)   Ht 6' (1.829 m)   Wt 155 lb 9.6 oz (70.6 kg)   SpO2 98%   BMI  21.10 kg/m?  ?BP Readings from Last 3 Encounters:  ?12/18/21 126/70  ?12/05/21 (!) 130/52  ?06/12/21 108/69  ? ?Wt Readings from Last 3 Encounters:  ?12/18/21 155 lb 9.6 oz (70.6 kg)  ?12/05/21 151 lb (68.5 kg)  ?06/12/21 154 lb (69.9 kg)  ? ?  ? ?Physical Exam ?Vitals reviewed.  ?Constitutional:   ?   Appearance: Normal appearance.  ?HENT:  ?   Head: Normocephalic and atraumatic.  ?   Right Ear: External ear normal.  ?   Left Ear: External ear normal.  ?Eyes:  ?   General: No scleral icterus. ?   Conjunctiva/sclera: Conjunctivae normal.  ?Cardiovascular:  ?   Rate and Rhythm: Normal rate and regular rhythm.  ?   Pulses: Normal pulses.  ?   Heart sounds: Normal heart sounds.  ?   Comments: Pulses good. Large varicose veins. Chronic skin changes due to venous stasis. ?Pulmonary:  ?   Effort: Pulmonary effort is normal.  ?   Breath sounds: Normal breath sounds.  ?Musculoskeletal:  ?   Right lower leg: No edema.  ?   Left lower leg: No edema.  ?Skin: ?   General: Skin is warm and dry.  ?   Capillary Refill: Capillary refill takes 2 to 3 seconds.  ?Neurological:  ?   General: No focal deficit present.  ?   Mental Status: He is alert and oriented to person, place, and time.  ?Psychiatric:     ?   Mood and Affect: Mood normal.     ?   Behavior: Behavior normal.     ?   Thought Content: Thought content normal.     ?   Judgment: Judgment normal.  ?  ? ? ?No results found for any visits on 12/18/21. ? Assessment & Plan  ?  ? ?1. Essential hypertension ?Blood pressure control. ?- Comprehensive metabolic panel ?- TSH ?- CBC with Differential/Platelet ? ?2. Cervical disc disorder with radiculopathy ?Follow-up with neurosurgery ? ?3. Chronic bilateral low back pain with bilateral sciatica ?Recent compression fractures.  BMD pending.  Will order serum protein electrophoresis also.  Patient advised to take calcium and vitamin D daily. ?- Protein Electrophoresis, (serum) ? ?4. Chronic anemia ?Serum protein electrophoresis ?- CBC  with Differential/Platelet ?- Protein Electrophoresis, (serum) ? ?5. Pure hypercholesterolemia ? ? ?6. Carotid artery disease, unspecified laterality, unspecified type ([place]) ? ?- Comprehensive metabolic panel ?- Lipid Panel With LDL/HDL Ratio ? ?7. Other chronic pain ?Not increase his pain medication.  If he has more worsening pain will refer to pain clinic ?- oxyCODONE (ROXICODONE) 15 MG immediate release tablet; Take 1 tablet (15 mg total) by mouth  every 4 (four) hours as needed for pain.  Dispense: 120 tablet; Refill: 0 ?- oxyCODONE (ROXICODONE) 15 MG immediate release tablet; Take 1 tablet (15 mg total) by mouth every 4 (four) hours as needed for pain.  Dispense: 120 tablet; Refill: 0 ?- oxyCODONE (ROXICODONE) 15 MG immediate release tablet; Take 1 tablet (15 mg total) by mouth every 4 (four) hours as needed for pain.  Dispense: 120 tablet; Refill: 0 ? ?8. Benign prostatic hyperplasia with nocturia ?Patient requests a PSA which is not indicated.  Symptoms are stable.  ?- PSA ? ? ?No follow-ups on file.  ?   ? ?I, [name], MD, have reviewed all documentation for this visit. The documentation on 12/20/21 for the exam, diagnosis, procedures, and orders are all accurate and complete. ? ? ? ?Kavi Almquist Cranford Mon, MD  ?[facility] ?[phone] (phone) ?[phone] (fax) ? ?[facility] ?

## 2021-12-19 DIAGNOSIS — M5441 Lumbago with sciatica, right side: Secondary | ICD-10-CM | POA: Diagnosis not present

## 2021-12-19 DIAGNOSIS — I779 Disorder of arteries and arterioles, unspecified: Secondary | ICD-10-CM | POA: Diagnosis not present

## 2021-12-19 DIAGNOSIS — I10 Essential (primary) hypertension: Secondary | ICD-10-CM | POA: Diagnosis not present

## 2021-12-19 DIAGNOSIS — D649 Anemia, unspecified: Secondary | ICD-10-CM | POA: Diagnosis not present

## 2021-12-19 DIAGNOSIS — N401 Enlarged prostate with lower urinary tract symptoms: Secondary | ICD-10-CM | POA: Diagnosis not present

## 2021-12-19 DIAGNOSIS — R351 Nocturia: Secondary | ICD-10-CM | POA: Diagnosis not present

## 2021-12-19 DIAGNOSIS — M5442 Lumbago with sciatica, left side: Secondary | ICD-10-CM | POA: Diagnosis not present

## 2021-12-19 DIAGNOSIS — G8929 Other chronic pain: Secondary | ICD-10-CM | POA: Diagnosis not present

## 2021-12-24 LAB — PROTEIN ELECTROPHORESIS, SERUM
A/G Ratio: 1.4 (ref 0.7–1.7)
Albumin ELP: 4 g/dL (ref 2.9–4.4)
Alpha 1: 0.3 g/dL (ref 0.0–0.4)
Alpha 2: 0.9 g/dL (ref 0.4–1.0)
Beta: 0.7 g/dL (ref 0.7–1.3)
Gamma Globulin: 0.9 g/dL (ref 0.4–1.8)
Globulin, Total: 2.8 g/dL (ref 2.2–3.9)

## 2021-12-24 LAB — PSA: Prostate Specific Ag, Serum: 1.1 ng/mL (ref 0.0–4.0)

## 2021-12-24 LAB — TSH: TSH: 1.87 u[IU]/mL (ref 0.450–4.500)

## 2021-12-24 LAB — LIPID PANEL WITH LDL/HDL RATIO
Cholesterol, Total: 135 mg/dL (ref 100–199)
HDL: 74 mg/dL (ref >39)
LDL Chol Calc (NIH): 47 mg/dL (ref 0–99)
LDL/HDL Ratio: 0.6 ratio (ref 0.0–3.6)
Triglycerides: 69 mg/dL (ref 0–149)
VLDL Cholesterol Cal: 14 mg/dL (ref 5–40)

## 2021-12-24 LAB — CBC WITH DIFFERENTIAL/PLATELET
Basophils Absolute: 0 10*3/uL (ref 0.0–0.2)
Basos: 1 %
EOS (ABSOLUTE): 0.1 10*3/uL (ref 0.0–0.4)
Eos: 2 %
Hematocrit: 33.2 % — ABNORMAL LOW (ref 37.5–51.0)
Hemoglobin: 11.6 g/dL — ABNORMAL LOW (ref 13.0–17.7)
Immature Grans (Abs): 0 10*3/uL (ref 0.0–0.1)
Immature Granulocytes: 0 %
Lymphocytes Absolute: 1 10*3/uL (ref 0.7–3.1)
Lymphs: 18 %
MCH: 36.4 pg — ABNORMAL HIGH (ref 26.6–33.0)
MCHC: 34.9 g/dL (ref 31.5–35.7)
MCV: 104 fL — ABNORMAL HIGH (ref 79–97)
Monocytes Absolute: 0.8 10*3/uL (ref 0.1–0.9)
Monocytes: 14 %
Neutrophils Absolute: 3.7 10*3/uL (ref 1.4–7.0)
Neutrophils: 65 %
Platelets: 195 10*3/uL (ref 150–450)
RBC: 3.19 x10E6/uL — ABNORMAL LOW (ref 4.14–5.80)
RDW: 11.8 % (ref 11.6–15.4)
WBC: 5.7 10*3/uL (ref 3.4–10.8)

## 2021-12-24 LAB — COMPREHENSIVE METABOLIC PANEL
ALT: 13 IU/L (ref 0–44)
AST: 20 IU/L (ref 0–40)
Albumin/Globulin Ratio: 1.7 (ref 1.2–2.2)
Albumin: 4.3 g/dL (ref 3.7–4.7)
Alkaline Phosphatase: 59 IU/L (ref 44–121)
BUN/Creatinine Ratio: 19 (ref 10–24)
BUN: 20 mg/dL (ref 8–27)
Bilirubin Total: 0.5 mg/dL (ref 0.0–1.2)
CO2: 26 mmol/L (ref 20–29)
Calcium: 9.4 mg/dL (ref 8.6–10.2)
Chloride: 101 mmol/L (ref 96–106)
Creatinine, Ser: 1.06 mg/dL (ref 0.76–1.27)
Globulin, Total: 2.5 g/dL (ref 1.5–4.5)
Glucose: 86 mg/dL (ref 70–99)
Potassium: 4.5 mmol/L (ref 3.5–5.2)
Sodium: 139 mmol/L (ref 134–144)
Total Protein: 6.8 g/dL (ref 6.0–8.5)
eGFR: 74 mL/min/{1.73_m2} (ref >59)

## 2022-01-01 ENCOUNTER — Telehealth: Payer: Self-pay

## 2022-01-01 ENCOUNTER — Ambulatory Visit
Admission: RE | Admit: 2022-01-01 | Discharge: 2022-01-01 | Disposition: A | Discharge status: Home / Self Care | Payer: Medicare Other | Source: Ambulatory Visit | Attending: Family Medicine | Admitting: Family Medicine

## 2022-01-01 ENCOUNTER — Other Ambulatory Visit: Payer: Self-pay

## 2022-01-01 DIAGNOSIS — Z87310 Personal history of (healed) osteoporosis fracture: Secondary | ICD-10-CM | POA: Diagnosis not present

## 2022-01-01 DIAGNOSIS — M85851 Other specified disorders of bone density and structure, right thigh: Secondary | ICD-10-CM | POA: Diagnosis not present

## 2022-01-01 DIAGNOSIS — M8448XA Pathological fracture, other site, initial encounter for fracture: Secondary | ICD-10-CM | POA: Diagnosis not present

## 2022-01-01 NOTE — Telephone Encounter (Signed)
-----   Message from [place] sent at 01/01/2022  2:22 PM EDT ----- ? ?----- Message ----- ?From: Myles Gip, DO ?Sent: 01/01/2022  12:30 PM EDT ?To: [facility] ? ?Bone density scan shows osteopenia. He should increase his calcium (at least '1000mg'$ ) and vitamin D (600 IU) intake and weight bearing exercise.  ? ?

## 2022-01-01 NOTE — Telephone Encounter (Signed)
Patient advised. Requested information on Osteopenia and copy of report. ?

## 2022-01-07 ENCOUNTER — Telehealth: Payer: Self-pay

## 2022-01-07 NOTE — Telephone Encounter (Signed)
E[place] with [facility] reports pt. Brought in a paper prescription for oxycodone '15mg'$  IR, but the prescription looked like 1/4 of it had been cut with scissors. She needs verification it is ok to fill this prescription, since it has been altered. Please advise. ?

## 2022-01-07 NOTE — Telephone Encounter (Signed)
Prescription was verified.  Future prescriptions to be sent to the pharmacy and not given to patient ?

## 2022-01-07 NOTE — Telephone Encounter (Signed)
Copied from [place] [id number]. Topic: General - Other ?>> Jan 07, 2022 12:14 PM Tessa Lerner A wrote: ?Reason for CRM: The patient's been directed by their pharmacy to have a member of clinical staff call and confirm the patient's prescription for oxyCODONE (ROXICODONE) 15 MG immediate release tablet [[id number]]  ? ?Please contact the patient's pharmacy  ? ?[id number] [place] [facility] [address] [place]?Phone: [phone]: [phone]? ?The patient would like to be contacted once confirmation of the prescription is complete ?

## 2022-01-10 NOTE — Progress Notes (Signed)
?  ? ? ?Established patient visit ? ?I,[name],acting as a scribe for [name], MD.,have documented all relevant documentation on the behalf of [name], MD,as directed by  [name], MD while in the presence of [name], MD. ? ? ?Patient: Daniel Owens   DOB: [birth year]   76 y.o. Male  MRN: [id number] ?Visit Date: 01/13/2022 ? ?Today's healthcare provider: [name], MD  ? ?Chief Complaint  ?Patient presents with  ? Follow-up  ? Back Pain  ? ?Subjective  ?  ?HPI  ?BMD showed osteopenia for patient who was told he had very soft bones by the surgeon.  Protein electrophoresis was negative for bite for multiple myeloma. ?He continues to complain of his chronic pain syndrome. ?Follow up for Chronic bilateral low back pain with bilateral sciatica: ? ?The patient was last seen for this 3 weeks ago. ?Changes made at last visit include; Recent compression fractures.  BMD shows- osteopenia. Ordered serum protein electrophoresis.  Patient advised to increase calcium and vitamin D daily. ? ?He reports good compliance with treatment. ?He feels that condition is Unchanged. ?He is not having side effects. none ? ?----------------------------------------------------------------------------------------- ? ? ?Medications: ?Outpatient Medications Prior to Visit  ?Medication Sig  ? Ascorbic Acid (VITAMIN C) 1000 MG tablet Take 1,000 mg by mouth daily.  ? B Complex Vitamins (VITAMIN B COMPLEX PO) Take 1 tablet by mouth daily.  ? beta carotene 25000 UNIT capsule Take 25,000 Units by mouth daily.  ? celecoxib (CELEBREX) 200 MG capsule Take 1 capsule (200 mg total) by mouth daily.  ? fish oil-omega-3 fatty acids 1000 MG capsule Take 1 g by mouth daily.  ? fluticasone (FLONASE) 50 MCG/ACT nasal spray Place 2 sprays into both nostrils daily.  ? folic acid (FOLVITE) 878 MCG tablet Take 1 tablet (400 mcg total) by mouth daily.  ? GLUCOSAMINE-CHONDROIT-VIT C-MN PO Take by mouth daily.   ?  losartan (COZAAR) 50 MG tablet TAKE 1 TABLET(50 MG) BY MOUTH DAILY  ? metoprolol succinate (TOPROL-XL) 25 MG 24 hr tablet Take 1 tablet (25 mg total) by mouth daily.  ? Multiple Vitamin (MULTIVITAMIN WITH MINERALS) TABS tablet Take 1 tablet by mouth daily.  ? omeprazole (PRILOSEC) 20 MG capsule Take 1 capsule (20 mg total) by mouth daily.  ? oxyCODONE (ROXICODONE) 15 MG immediate release tablet Take 1 tablet (15 mg total) by mouth every 4 (four) hours as needed for pain.  ? oxyCODONE (ROXICODONE) 15 MG immediate release tablet Take 1 tablet (15 mg total) by mouth every 4 (four) hours as needed for pain.  ? oxyCODONE (ROXICODONE) 15 MG immediate release tablet Take 1 tablet (15 mg total) by mouth every 4 (four) hours as needed for pain.  ? pregabalin (LYRICA) 75 MG capsule Take 1 capsule (75 mg total) by mouth 2 (two) times daily.  ? rosuvastatin (CRESTOR) 5 MG tablet TAKE 1 TABLET(5 MG) BY MOUTH DAILY  ? Selenium 200 MCG TABS Take 200 mcg by mouth daily.  ? tamsulosin (FLOMAX) 0.4 MG CAPS capsule Take 1 capsule (0.4 mg total) by mouth daily.  ? Zinc 50 MG CAPS Take 1 capsule by mouth daily.  ? ?No facility-administered medications prior to visit.  ? ? ?Review of Systems  ?Constitutional:  Negative for appetite change, chills and fever.  ?Respiratory:  Negative for chest tightness, shortness of breath and wheezing.   ?Cardiovascular:  Negative for chest pain and palpitations.  ?Gastrointestinal:  Negative for abdominal pain, nausea and  vomiting.  ? ?Last metabolic panel ?Lab Results  ?Component Value Date  ? GLUCOSE 86 12/19/2021  ? NA 139 12/19/2021  ? K 4.5 12/19/2021  ? CL 101 12/19/2021  ? CO2 26 12/19/2021  ? BUN 20 12/19/2021  ? CREATININE 1.06 12/19/2021  ? EGFR 74 12/19/2021  ? CALCIUM 9.4 12/19/2021  ? PROT 6.8 12/19/2021  ? ALBUMIN 4.3 12/19/2021  ? LABGLOB 2.5 12/19/2021  ? LABGLOB 2.8 12/19/2021  ? AGRATIO 1.7 12/19/2021  ? AGRATIO 1.4 12/19/2021  ? BILITOT 0.5 12/19/2021  ? ALKPHOS 59 12/19/2021  ? AST  20 12/19/2021  ? ALT 13 12/19/2021  ? ANIONGAP 8 01/14/2021  ? ?  ?  Objective  ?  ?BP 125/68 (BP Location: Left Arm, Patient Position: Sitting, Cuff Size: Normal)   Pulse (!) 59   Temp 98.5 ?F (36.9 ?C) (Temporal)   Resp 16   Ht 6' (1.829 m)   Wt 152 lb (68.9 kg)   SpO2 97%   BMI 20.61 kg/m?  ?BP Readings from Last 3 Encounters:  ?01/13/22 125/68  ?12/18/21 126/70  ?12/05/21 (!) 130/52  ? ?Wt Readings from Last 3 Encounters:  ?01/13/22 152 lb (68.9 kg)  ?12/18/21 155 lb 9.6 oz (70.6 kg)  ?12/05/21 151 lb (68.5 kg)  ? ?  ? ?Physical Exam ?Vitals reviewed.  ?Constitutional:   ?   Appearance: Normal appearance.  ?HENT:  ?   Head: Normocephalic and atraumatic.  ?   Right Ear: External ear normal.  ?   Left Ear: External ear normal.  ?Eyes:  ?   General: No scleral icterus. ?   Conjunctiva/sclera: Conjunctivae normal.  ?Cardiovascular:  ?   Rate and Rhythm: Normal rate and regular rhythm.  ?   Pulses: Normal pulses.  ?   Heart sounds: Normal heart sounds.  ?   Comments: Pulses good. Large varicose veins. Chronic skin changes due to venous stasis. ?Pulmonary:  ?   Effort: Pulmonary effort is normal.  ?   Breath sounds: Normal breath sounds.  ?Musculoskeletal:  ?   Right lower leg: No edema.  ?   Left lower leg: No edema.  ?Skin: ?   General: Skin is warm and dry.  ?   Capillary Refill: Capillary refill takes 2 to 3 seconds.  ?Neurological:  ?   General: No focal deficit present.  ?   Mental Status: He is alert and oriented to person, place, and time.  ?Psychiatric:     ?   Mood and Affect: Mood normal.     ?   Behavior: Behavior normal.     ?   Thought Content: Thought content normal.     ?   Judgment: Judgment normal.  ?  ? ? ?No results found for any visits on 01/13/22. ? Assessment & Plan  ?  ? ?1. Chronic bilateral low back pain with bilateral sciatica ?Refills will only be given electronically going forward. ? ?2. Essential hypertension ?Good control. ? ?3. Osteopenia of lumbar spine ?Clinically the patient  has osteoporosis per the surgeon but osteopenia on bone density B.  Either way will increase calcium to daily and vitamin D to 2 daily and start alendronate 70 mg weekly.  Repeat bone density scan in 2 years.  Some thought that it could be from chronic steroid use and steroid shots of this back ? ?4. Other chronic pain ? ? ?5. Bilateral primary osteoarthritis of knee ? ? ?6. Traumatic complete tear of left rotator cuff, sequela ? ? ?  7. Cervical disc disorder with radiculopathy ? ? ? ?No follow-ups on file.  ?   ? ?I, [name], MD, have reviewed all documentation for this visit. The documentation on 01/14/22 for the exam, diagnosis, procedures, and orders are all accurate and complete. ? ? ? ?Leanora Murin Cranford Mon, MD  ?[facility] ?[phone] (phone) ?[phone] (fax) ? ?[facility] ?

## 2022-01-13 ENCOUNTER — Other Ambulatory Visit: Payer: Self-pay

## 2022-01-13 ENCOUNTER — Encounter: Payer: Self-pay | Admitting: Family Medicine

## 2022-01-13 ENCOUNTER — Ambulatory Visit (INDEPENDENT_AMBULATORY_CARE_PROVIDER_SITE_OTHER): Payer: Medicare Other | Admitting: Family Medicine

## 2022-01-13 VITALS — BP 125/68 | HR 59 | Temp 98.5°F | Resp 16 | Ht 72.0 in | Wt 152.0 lb

## 2022-01-13 DIAGNOSIS — S46012S Strain of muscle(s) and tendon(s) of the rotator cuff of left shoulder, sequela: Secondary | ICD-10-CM | POA: Diagnosis not present

## 2022-01-13 DIAGNOSIS — M5441 Lumbago with sciatica, right side: Secondary | ICD-10-CM | POA: Diagnosis not present

## 2022-01-13 DIAGNOSIS — M8588 Other specified disorders of bone density and structure, other site: Secondary | ICD-10-CM

## 2022-01-13 DIAGNOSIS — M5010 Cervical disc disorder with radiculopathy, unspecified cervical region: Secondary | ICD-10-CM

## 2022-01-13 DIAGNOSIS — M5442 Lumbago with sciatica, left side: Secondary | ICD-10-CM | POA: Diagnosis not present

## 2022-01-13 DIAGNOSIS — I10 Essential (primary) hypertension: Secondary | ICD-10-CM

## 2022-01-13 DIAGNOSIS — M170 Bilateral primary osteoarthritis of knee: Secondary | ICD-10-CM | POA: Diagnosis not present

## 2022-01-13 DIAGNOSIS — G8929 Other chronic pain: Secondary | ICD-10-CM

## 2022-01-13 MED ORDER — ALENDRONATE SODIUM 70 MG PO TABS: 70.0000 mg | ORAL_TABLET | ORAL | 11 refills | Status: AC

## 2022-01-13 NOTE — Patient Instructions (Signed)
TAKE CALCIUM DAILY AND VITAMIN D 2 TIMES DAILY. ?

## 2022-01-15 ENCOUNTER — Ambulatory Visit: Payer: Self-pay | Admitting: *Deleted

## 2022-01-15 NOTE — Telephone Encounter (Signed)
Pt called in stating he is currently taking FOSAMAX but was reading some of the information and how it mentioned being cautious taking with other medications that he is indeed taking, pt wanted to speak with a nurse about some of his medications and if they are okay to take with this. Please advise.   ?  ? ? ? ?Chief Complaint: Concerned about taking Fosamax ?Symptoms: NA ?Frequency: NA ?Pertinent Negatives: Patient denies NA ?Disposition: '[]'$ ED /'[]'$ Urgent Care (no appt availability in office) / '[]'$ Appointment(In office/virtual)/ '[]'$  [facility] [facility]/ '[]'$ Home Care/ '[]'$ Refused Recommended Disposition /'[]'$ [facility]/ '[x]'$  Follow-up with PCP ?Additional Notes: Pt states picked up med, cautions on brochure to consult with PCP regarding Antacids and NSAIDS. Pt states he is on both. Has not started taking. Please advise: ?[phone] ? ?Reason for Disposition ? [1] Caller has NON-URGENT medicine question about med that PCP prescribed AND [2] triager unable to answer question ? ?Answer Assessment - Initial Assessment Questions ?1. NAME of MEDICATION: "What medicine are you calling about?" ?    Fosamax ?2. QUESTION: "What is your question?" (e.g., double dose of medicine, side effect) ?    Should he take with meds he is on presently ?3. PRESCRIBING HCP: "Who prescribed it?" Reason: if prescribed by specialist, call should be referred to that group. ?    PCP ? ?Protocols used: Medication Question Call-A-AH ? ?

## 2022-01-15 NOTE — Telephone Encounter (Signed)
Please advise 

## 2022-02-18 ENCOUNTER — Telehealth: Payer: Self-pay | Admitting: Family Medicine

## 2022-02-18 MED ORDER — METOPROLOL SUCCINATE ER 25 MG PO TB24: 25.0000 mg | ORAL_TABLET | Freq: Every day | ORAL | 1 refills | Status: DC

## 2022-02-18 NOTE — Telephone Encounter (Signed)
Refilled

## 2022-02-18 NOTE — Telephone Encounter (Signed)
[facility] faxed refill request for the following medications:   metoprolol succinate (TOPROL-XL) 25 MG 24 hr tablet    Please advise.  

## 2022-03-10 DIAGNOSIS — D531 Other megaloblastic anemias, not elsewhere classified: Secondary | ICD-10-CM | POA: Diagnosis not present

## 2022-03-10 DIAGNOSIS — R0989 Other specified symptoms and signs involving the circulatory and respiratory systems: Secondary | ICD-10-CM | POA: Diagnosis not present

## 2022-03-10 DIAGNOSIS — Z87891 Personal history of nicotine dependence: Secondary | ICD-10-CM | POA: Diagnosis not present

## 2022-03-10 DIAGNOSIS — I10 Essential (primary) hypertension: Secondary | ICD-10-CM | POA: Diagnosis not present

## 2022-03-10 DIAGNOSIS — D72820 Lymphocytosis (symptomatic): Secondary | ICD-10-CM | POA: Diagnosis not present

## 2022-03-10 DIAGNOSIS — R5383 Other fatigue: Secondary | ICD-10-CM | POA: Diagnosis not present

## 2022-03-10 DIAGNOSIS — Z79899 Other long term (current) drug therapy: Secondary | ICD-10-CM | POA: Diagnosis not present

## 2022-03-10 DIAGNOSIS — R0609 Other forms of dyspnea: Secondary | ICD-10-CM | POA: Diagnosis not present

## 2022-03-12 DIAGNOSIS — R0609 Other forms of dyspnea: Secondary | ICD-10-CM | POA: Diagnosis not present

## 2022-03-12 DIAGNOSIS — R0600 Dyspnea, unspecified: Secondary | ICD-10-CM | POA: Diagnosis not present

## 2022-03-31 ENCOUNTER — Ambulatory Visit: Payer: Medicare Other | Admitting: Family Medicine

## 2022-04-08 ENCOUNTER — Ambulatory Visit (INDEPENDENT_AMBULATORY_CARE_PROVIDER_SITE_OTHER): Payer: Medicare Other

## 2022-04-08 VITALS — Wt 152.0 lb

## 2022-04-08 DIAGNOSIS — Z0000 Encounter for general adult medical examination without abnormal findings: Secondary | ICD-10-CM | POA: Diagnosis not present

## 2022-04-08 NOTE — Progress Notes (Signed)
Virtual Visit via Telephone Note  I connected with  Daniel Owens on 04/08/22 at 11:30 AM EDT by telephone and verified that I am speaking with the correct person using two identifiers.  Location: Patient: home Provider: [facility] Persons participating in the virtual visit: [place]   I discussed the limitations, risks, security and privacy concerns of performing an evaluation and management service by telephone and the availability of in person appointments. The patient expressed understanding and agreed to proceed.  Interactive audio and video telecommunications were attempted between this nurse and patient, however failed, due to patient having technical difficulties OR patient did not have access to video capability.  We continued and completed visit with audio only.  Some vital signs may be absent or patient reported.   Dionisio David, LPN  Subjective:   Daniel Owens is a 76 y.o. male who presents for Medicare Annual/Subsequent preventive examination.  Review of Systems     Cardiac Risk Factors include: advanced age (>90mn, >>59women);male gender;hypertension;dyslipidemia     Objective:    There were no vitals filed for this visit. There is no height or weight on file to calculate BMI.     04/08/2022   11:38 AM 04/23/2021   11:57 AM 09/03/2020    5:16 PM 06/28/2020    2:39 PM 06/20/2019    2:47 PM 06/13/2019    8:33 PM 10/21/2018   10:14 AM  Advanced Directives  Does Patient Have a Medical Advance Directive? Yes No No Yes Yes Yes Yes  Type of AParamedicof [place]Living will   [place] will Living will;Healthcare Power of [place] will   Does patient want to make changes to medical advance directive? Yes (Inpatient - patient defers changing a medical advance directive and declines information at this time)     No - Patient declined   Copy of [place]  Chart? Yes - validated most recent copy scanned in chart (See row information)   No - copy requested No - copy requested No - copy requested     Current Medications (verified) Outpatient Encounter Medications as of 04/08/2022  Medication Sig   alendronate (FOSAMAX) 70 MG tablet Take 1 tablet (70 mg total) by mouth every 7 (seven) days. Take with a full glass of water on an empty stomach.   Ascorbic Acid (VITAMIN C) 1000 MG tablet Take 1,000 mg by mouth daily.   B Complex Vitamins (VITAMIN B COMPLEX PO) Take 1 tablet by mouth daily.   beta carotene 25000 UNIT capsule Take 25,000 Units by mouth daily.   calcium carbonate (OS-CAL - DOSED IN MG OF ELEMENTAL CALCIUM) 1250 (500 Ca) MG tablet Take by mouth.   celecoxib (CELEBREX) 200 MG capsule Take 1 capsule (200 mg total) by mouth daily.   fish oil-omega-3 fatty acids 1000 MG capsule Take 1 g by mouth daily.   fluticasone (FLONASE) 50 MCG/ACT nasal spray Place 2 sprays into both nostrils daily.   folic acid (FOLVITE) 4283MCG tablet Take 1 tablet (400 mcg total) by mouth daily.   GLUCOSAMINE-CHONDROIT-VIT C-MN PO Take by mouth daily.    losartan (COZAAR) 50 MG tablet TAKE 1 TABLET(50 MG) BY MOUTH DAILY   metoprolol succinate (TOPROL-XL) 25 MG 24 hr tablet Take 1 tablet (25 mg total) by mouth daily.   Multiple Vitamin (MULTIVITAMIN WITH MINERALS) TABS tablet Take 1 tablet by mouth daily.   omeprazole (PRILOSEC) 20 MG  capsule Take 1 capsule (20 mg total) by mouth daily.   oxyCODONE (ROXICODONE) 15 MG immediate release tablet Take 1 tablet (15 mg total) by mouth every 4 (four) hours as needed for pain.   Selenium 200 MCG TABS Take 200 mcg by mouth daily.   tamsulosin (FLOMAX) 0.4 MG CAPS capsule Take 1 capsule (0.4 mg total) by mouth daily.   Zinc 50 MG CAPS Take 1 capsule by mouth daily.   pregabalin (LYRICA) 75 MG capsule Take 1 capsule (75 mg total) by mouth 2 (two) times daily. (Patient not taking: Reported on 04/08/2022)   rosuvastatin (CRESTOR) 5  MG tablet TAKE 1 TABLET(5 MG) BY MOUTH DAILY   [DISCONTINUED] oxyCODONE (ROXICODONE) 15 MG immediate release tablet Take 1 tablet (15 mg total) by mouth every 4 (four) hours as needed for pain.   [DISCONTINUED] oxyCODONE (ROXICODONE) 15 MG immediate release tablet Take 1 tablet (15 mg total) by mouth every 4 (four) hours as needed for pain.   No facility-administered encounter medications on file as of 04/08/2022.    Allergies (verified) Patient has no known allergies.   History: Past Medical History:  Diagnosis Date   Abnormal stress test    a. 07/2017 ETT: Ex 10:15, no chest pain, HTN response, 14m ST dep in II, II, aVF, V4-V6 - returned to baseline in 5 mins-->Intermediate risk study. b. Cardiac CTA normal without CAD (08/2017)   Aortic atherosclerosis ([place]    a. Incidentally noted on Chest CT in 2016.   [place]    a. 07/2014 carotid u/s: mild <50%, bilat ICA stenosis.   Hyperlipidemia    Hypertension    Macrocytic anemia    osteoarthritis    Palpitations    a. ? prior dx of PAF.   Raynauds syndrome    Systolic murmur    a. Reports h/o rheumatic fever as a child;  b. 07/2017 Echo: EF 55-60%, no rwma, triv AI, nl RV fxn.   Past Surgical History:  Procedure Laterality Date   APPENDECTOMY     BACK SURGERY     January 2023   BILATERAL KNEE ARTHROSCOPY     HERNIA REPAIR     NECK SURGERY     involving disc   NOSE SURGERY     RETINAL DETACHMENT SURGERY     SHOULDER SURGERY     Right-x 3 overall   TENDON REPAIR     involving Tricep tendon   Family History  Problem Relation Age of Onset   Stroke Mother    Uterine cancer Mother    Throat cancer Father    Allergic rhinitis Brother    Prostate cancer Maternal Uncle    Prostate cancer Paternal Uncle    Heart attack Maternal Grandmother    Heart attack Maternal Grandfather    Social History   Socioeconomic History   Marital status: Married    Spouse name: Not on file   Number of children: 2    Years of education: Not on file   Highest education level: Associate degree: academic program  Occupational History   Occupation: retired  Tobacco Use   Smoking status: Former    Types: Cigarettes    Quit date: 10/19/1977    Years since quitting: 44.4   Smokeless tobacco: Current    Types: Chew   Tobacco comments:    trying to quit  Vaping Use   Vaping Use: Never used  Substance and Sexual Activity   Alcohol use: Not Currently  Comment: very seldom- wine   Drug use: No   Sexual activity: Not on file  Other Topics Concern   Not on file  Social History Narrative   Not on file   Social Determinants of Health   Financial Resource Strain: Low Risk  (04/08/2022)   Overall Financial Resource Strain (CARDIA)    Difficulty of Paying Living Expenses: Not hard at all  Food Insecurity: No Food Insecurity (04/08/2022)   Hunger Vital Sign    Worried About Running Out of Food in the Last Year: Never true    Ran Out of Food in the Last Year: Never true  Transportation Needs: No Transportation Needs (04/08/2022)   PRAPARE - Hydrologist (Medical): No    Lack of Transportation (Non-Medical): No  Physical Activity: Insufficiently Active (04/08/2022)   Exercise Vital Sign    Days of Exercise per Week: 3 days    Minutes of Exercise per Session: 30 min  Stress: No Stress Concern Present (04/08/2022)   [place]    Feeling of Stress : Not at all  Social Connections: [place] (04/08/2022)   Social Connection and Isolation Panel [NHANES]    Frequency of Communication with Friends and Family: More than three times a week    Frequency of Social Gatherings with Friends and Family: Once a week    Attends Religious Services: More than 4 times per year    Active Member of [employer] or Organizations: Yes    Attends Music therapist: More than 4 times per year    Marital Status: Married     Tobacco Counseling Ready to quit: Not Answered Counseling given: Not Answered Tobacco comments: trying to quit   Clinical Intake:  Pre-visit preparation completed: Yes  Pain : No/denies pain     Diabetes: No  How often do you need to have someone help you when you read instructions, pamphlets, or other written materials from your doctor or pharmacy?: 1 - Never  Diabetic?no  Interpreter Needed?: No  Information entered by :: Kirke Shaggy, LPN   Activities of Daily Living    04/08/2022   11:39 AM 12/18/2021    4:27 PM  In your present state of health, do you have any difficulty performing the following activities:  Hearing? 0 0  Vision? 0 0  Difficulty concentrating or making decisions? 0 0  Walking or climbing stairs? 1 1  Dressing or bathing? 0 0  Doing errands, shopping? 0 0  Preparing Food and eating ? N   Using the Toilet? N   In the past six months, have you accidently leaked urine? N   Do you have problems with loss of bowel control? N   Managing your Medications? N   Managing your Finances? N   Housekeeping or managing your Housekeeping? N     Patient Care Team: Jerrol Banana., MD as PCP - General (Family Medicine) End, Harrell Gave, MD as PCP - Cardiology (Cardiology) Anell Barr, OD as Consulting Physician (Optometry) Kristeen Miss, MD as Consulting Physician (Neurosurgery) Marry Guan, Laurice Record, MD (Orthopedic Surgery) O'Branski, Cleophus Molt, [place] (Oncology) Lucky Cowboy Erskine Squibb, MD as Referring Physician (Vascular Surgery) Mardi Mainland, MD as Referring Physician (Orthopedic Surgery)  Indicate any recent Medical Services you may have received from other than [facility] providers in the past year (date may be approximate).     Assessment:   This is a routine wellness examination for Zaahir.  Hearing/Vision screen Hearing Screening - Comments:: No aids Vision Screening - Comments:: Wears glasses- Dr.Woodard  Dietary issues and exercise  activities discussed: Current Exercise Habits: Home exercise routine, Type of exercise: walking, Time (Minutes): 30, Frequency (Times/Week): 3, Weekly Exercise (Minutes/Week): 90, Intensity: Mild   Goals Addressed             This Visit's Progress    DIET - EAT MORE FRUITS AND VEGETABLES         Depression Screen    04/08/2022   11:36 AM 12/18/2021    4:26 PM 09/18/2020   11:42 AM 06/28/2020    2:37 PM 06/20/2019    2:48 PM 06/14/2018    2:33 PM 06/12/2017   11:08 AM  PHQ 2/9 Scores  PHQ - 2 Score 0 0 0 0 0 0 0  PHQ- 9 Score 0 4 1        Fall Risk    04/08/2022   11:39 AM 12/18/2021    4:26 PM 09/18/2020   11:42 AM 06/28/2020    2:39 PM 09/14/2019    5:13 PM  Fall Risk   Falls in the past year? 0 0 0 0 0  Comment     Emmi Telephone Survey: data to providers prior to load  Number falls in past yr: 0 0 0 0   Injury with Fall? 0 0 0 0   Follow up Falls evaluation completed  Falls evaluation completed Falls prevention discussed     FALL RISK PREVENTION PERTAINING TO THE HOME:  Any stairs in or around the home? No  If so, are there any without handrails? No  Home free of loose throw rugs in walkways, pet beds, electrical cords, etc? Yes  Adequate lighting in your home to reduce risk of falls? Yes   ASSISTIVE DEVICES UTILIZED TO PREVENT FALLS:  Life alert? No  Use of a cane, walker or w/c? No  Grab bars in the bathroom? Yes  Shower chair or bench in shower? Yes  Elevated toilet seat or a handicapped toilet? Yes    Cognitive Function:        04/08/2022   11:40 AM 06/28/2020    2:46 PM 06/20/2019    3:02 PM 06/14/2018    2:42 PM 06/12/2017   11:23 AM  6CIT Screen  What Year? 0 points 0 points 0 points 0 points 0 points  What month? 0 points 0 points 0 points 0 points 0 points  What time? 0 points 0 points 0 points 0 points 0 points  Count back from 20 0 points 0 points 0 points 0 points 0 points  Months in reverse 0 points 0 points 0 points 0 points 0 points  Repeat  phrase 0 points 0 points 0 points 0 points 2 points  Total Score 0 points 0 points 0 points 0 points 2 points    Immunizations Immunization History  Administered Date(s) Administered   Influenza, High Dose Seasonal PF 07/30/2015, 07/30/2017, 07/27/2018, 07/01/2019   PFIZER(Purple Top)SARS-COV-2 Vaccination 05/20/2020   Pneumococcal Conjugate-13 08/08/2014   Pneumococcal Polysaccharide-23 11/15/2012   Td 06/12/2016   Zoster, Live 04/09/2010    TDAP status: Up to date  Flu Vaccine status: Up to date  Pneumococcal vaccine status: Up to date  Covid-19 vaccine status: Completed vaccines  Qualifies for Shingles Vaccine? Yes   Zostavax completed Yes   Shingrix Completed?: No.    Education has been provided regarding the importance of this vaccine. Patient has been advised to call  insurance company to determine out of pocket expense if they have not yet received this vaccine. Advised may also receive vaccine at local pharmacy or Health Dept. Verbalized acceptance and understanding.  Screening Tests Health Maintenance  Topic Date Due   Zoster Vaccines- Shingrix (1 of 2) Never done   COVID-19 Vaccine (2 - Pfizer risk series) 06/10/2020   INFLUENZA VACCINE  05/20/2022   COLONOSCOPY (Pts 45-46yr Insurance coverage will need to be confirmed)  12/25/2022   TETANUS/TDAP  06/12/2026   Pneumonia Vaccine 75 Years old  Completed   Hepatitis C Screening  Completed   HPV VACCINES  Aged Out    Health Maintenance  Health Maintenance Due  Topic Date Due   Zoster Vaccines- Shingrix (1 of 2) Never done   COVID-19 Vaccine (2 - Pfizer risk series) 06/10/2020    Colorectal cancer screening: Type of screening: Colonoscopy. Completed 08/04/17. Repeat every 5 years[place]  Lung Cancer Screening: (Low Dose CT Chest recommended if Age 10915-80years, 30 pack-year currently smoking OR have quit w/in 15years.) does not qualify.   Additional  Screening:  Hepatitis C Screening: does qualify; Completed 06/12/17  Vision Screening: Recommended annual ophthalmology exams for early detection of glaucoma and other disorders of the eye. Is the patient up to date with their annual eye exam?  Yes  Who is the provider or what is the name of the office in which the patient attends annual eye exams? Dr.Woodard If pt is not established with a provider, would they like to be referred to a provider to establish care? No .   Dental Screening: Recommended annual dental exams for proper oral hygiene  Community Resource Referral / Chronic Care Management: CRR required this visit?  No   CCM required this visit?  No      Plan:     I have personally reviewed and noted the following in the patient's chart:   Medical and social history Use of alcohol, tobacco or illicit drugs  Current medications and supplements including opioid prescriptions. Patient is currently taking opioid prescriptions. Information provided to patient regarding non-opioid alternatives. Patient advised to discuss non-opioid treatment plan with their provider. Functional ability and status Nutritional status Physical activity Advanced directives List of other physicians Hospitalizations, surgeries, and ER visits in previous 12 months Vitals Screenings to include cognitive, depression, and falls Referrals and appointments  In addition, I have reviewed and discussed with patient certain preventive protocols, quality metrics, and best practice recommendations. A written personalized care plan for preventive services as well as general preventive health recommendations were provided to patient.     LDionisio David LPN   [id number]  Nurse Notes: none

## 2022-04-08 NOTE — Patient Instructions (Signed)
Daniel Owens , Thank you for taking time to come for your Medicare Wellness Visit. I appreciate your ongoing commitment to your health goals. Please review the following plan we discussed and let me know if I can assist you in the future.   Screening recommendations/referrals: Colonoscopy: 08/04/17 Recommended yearly ophthalmology/optometry visit for glaucoma screening and checkup Recommended yearly dental visit for hygiene and checkup  Vaccinations: Influenza vaccine: 08/26/21 Pneumococcal vaccine: 08/08/14 Tdap vaccine: 06/12/16 Shingles vaccine: Zostavax 04/09/10   Covid-19: 05/20/20  Advanced directives: yes  Conditions/risks identified: none  Next appointment: Follow up in one year for your annual wellness visit. 04/13/23@ 10:30am by phone  Preventive Care 65 Years and Older, Male Preventive care refers to lifestyle choices and visits with your health care provider that can promote health and wellness. What does preventive care include? A yearly physical exam. This is also called an annual well check. Dental exams once or twice a year. Routine eye exams. Ask your health care provider how often you should have your eyes checked. Personal lifestyle choices, including: Daily care of your teeth and gums. Regular physical activity. Eating a healthy diet. Avoiding tobacco and drug use. Limiting alcohol use. Practicing safe sex. Taking low doses of aspirin every day. Taking vitamin and mineral supplements as recommended by your health care provider. What happens during an annual well check? The services and screenings done by your health care provider during your annual well check will depend on your age, overall health, lifestyle risk factors, and family history of disease. Counseling  Your health care provider may ask you questions about your: Alcohol use. Tobacco use. Drug use. Emotional well-being. Home and relationship well-being. Sexual activity. Eating habits. History of  falls. Memory and ability to understand (cognition). Work and work Statistician. Screening  You may have the following tests or measurements: Height, weight, and BMI. Blood pressure. Lipid and cholesterol levels. These may be checked every 5 years, or more frequently if you are over 37 years old. Skin check. Lung cancer screening. You may have this screening every year starting at age 94 if you have a 30-pack-year history of smoking and currently smoke or have quit within the past 15 years. Fecal occult blood test (FOBT) of the stool. You may have this test every year starting at age 25. Flexible sigmoidoscopy or colonoscopy. You may have a sigmoidoscopy every 5 years or a colonoscopy every 10 years starting at age 39. Prostate cancer screening. Recommendations will vary depending on your family history and other risks. Hepatitis C blood test. Hepatitis B blood test. Sexually transmitted disease (STD) testing. Diabetes screening. This is done by checking your blood sugar (glucose) after you have not eaten for a while (fasting). You may have this done every 1-3 years. Abdominal aortic aneurysm (AAA) screening. You may need this if you are a current or former smoker. Osteoporosis. You may be screened starting at age 34 if you are at high risk. Talk with your health care provider about your test results, treatment options, and if necessary, the need for more tests. Vaccines  Your health care provider may recommend certain vaccines, such as: Influenza vaccine. This is recommended every year. Tetanus, diphtheria, and acellular pertussis (Tdap, Td) vaccine. You may need a Td booster every 10 years. Zoster vaccine. You may need this after age 10. Pneumococcal 13-valent conjugate (PCV13) vaccine. One dose is recommended after age 60. Pneumococcal polysaccharide (PPSV23) vaccine. One dose is recommended after age 60. Talk to your health care provider about which  screenings and vaccines you need and  how often you need them. This information is not intended to replace advice given to you by your health care provider. Make sure you discuss any questions you have with your health care provider. Document Released: 11/02/2015 Document Revised: 06/25/2016 Document Reviewed: 08/07/2015 Elsevier Interactive Patient Education  2017 [place] Prevention in the Home Falls can cause injuries. They can happen to people of all ages. There are many things you can do to make your home safe and to help prevent falls. What can I do on the outside of my home? Regularly fix the edges of walkways and driveways and fix any cracks. Remove anything that might make you trip as you walk through a door, such as a raised step or threshold. Trim any bushes or trees on the path to your home. Use bright outdoor lighting. Clear any walking paths of anything that might make someone trip, such as rocks or tools. Regularly check to see if handrails are loose or broken. Make sure that both sides of any steps have handrails. Any raised decks and porches should have guardrails on the edges. Have any leaves, snow, or ice cleared regularly. Use sand or salt on walking paths during winter. Clean up any spills in your garage right away. This includes oil or grease spills. What can I do in the bathroom? Use night lights. Install grab bars by the toilet and in the tub and shower. Do not use towel bars as grab bars. Use non-skid mats or decals in the tub or shower. If you need to sit down in the shower, use a plastic, non-slip stool. Keep the floor dry. Clean up any water that spills on the floor as soon as it happens. Remove soap buildup in the tub or shower regularly. Attach bath mats securely with double-sided non-slip rug tape. Do not have throw rugs and other things on the floor that can make you trip. What can I do in the bedroom? Use night lights. Make sure that you have a light by your bed that is easy to  reach. Do not use any sheets or blankets that are too big for your bed. They should not hang down onto the floor. Have a firm chair that has side arms. You can use this for support while you get dressed. Do not have throw rugs and other things on the floor that can make you trip. What can I do in the kitchen? Clean up any spills right away. Avoid walking on wet floors. Keep items that you use a lot in easy-to-reach places. If you need to reach something above you, use a strong step stool that has a grab bar. Keep electrical cords out of the way. Do not use floor polish or wax that makes floors slippery. If you must use wax, use non-skid floor wax. Do not have throw rugs and other things on the floor that can make you trip. What can I do with my stairs? Do not leave any items on the stairs. Make sure that there are handrails on both sides of the stairs and use them. Fix handrails that are broken or loose. Make sure that handrails are as long as the stairways. Check any carpeting to make sure that it is firmly attached to the stairs. Fix any carpet that is loose or worn. Avoid having throw rugs at the top or bottom of the stairs. If you do have throw rugs, attach them to the floor with carpet  tape. Make sure that you have a light switch at the top of the stairs and the bottom of the stairs. If you do not have them, ask someone to add them for you. What else can I do to help prevent falls? Wear shoes that: Do not have high heels. Have rubber bottoms. Are comfortable and fit you well. Are closed at the toe. Do not wear sandals. If you use a stepladder: Make sure that it is fully opened. Do not climb a closed stepladder. Make sure that both sides of the stepladder are locked into place. Ask someone to hold it for you, if possible. Clearly mark and make sure that you can see: Any grab bars or handrails. First and last steps. Where the edge of each step is. Use tools that help you move  around (mobility aids) if they are needed. These include: Canes. Walkers. Scooters. Crutches. Turn on the lights when you go into a dark area. Replace any light bulbs as soon as they burn out. Set up your furniture so you have a clear path. Avoid moving your furniture around. If any of your floors are uneven, fix them. If there are any pets around you, be aware of where they are. Review your medicines with your doctor. Some medicines can make you feel dizzy. This can increase your chance of falling. Ask your doctor what other things that you can do to help prevent falls. This information is not intended to replace advice given to you by your health care provider. Make sure you discuss any questions you have with your health care provider. Document Released: 08/02/2009 Document Revised: 03/13/2016 Document Reviewed: 11/10/2014 Elsevier Interactive Patient Education  2017 [employer].

## 2022-04-14 ENCOUNTER — Ambulatory Visit (INDEPENDENT_AMBULATORY_CARE_PROVIDER_SITE_OTHER): Payer: Medicare Other | Admitting: Family Medicine

## 2022-04-14 ENCOUNTER — Encounter: Payer: Self-pay | Admitting: Family Medicine

## 2022-04-14 ENCOUNTER — Other Ambulatory Visit: Payer: Self-pay | Admitting: Family Medicine

## 2022-04-14 VITALS — BP 122/65 | HR 59 | Resp 16 | Wt 149.1 lb

## 2022-04-14 DIAGNOSIS — G8929 Other chronic pain: Secondary | ICD-10-CM

## 2022-04-14 DIAGNOSIS — D531 Other megaloblastic anemias, not elsewhere classified: Secondary | ICD-10-CM

## 2022-04-14 DIAGNOSIS — I10 Essential (primary) hypertension: Secondary | ICD-10-CM

## 2022-04-14 DIAGNOSIS — M5442 Lumbago with sciatica, left side: Secondary | ICD-10-CM | POA: Diagnosis not present

## 2022-04-14 DIAGNOSIS — M8000XD Age-related osteoporosis with current pathological fracture, unspecified site, subsequent encounter for fracture with routine healing: Secondary | ICD-10-CM | POA: Diagnosis not present

## 2022-04-14 DIAGNOSIS — M8448XA Pathological fracture, other site, initial encounter for fracture: Secondary | ICD-10-CM

## 2022-04-14 DIAGNOSIS — R634 Abnormal weight loss: Secondary | ICD-10-CM

## 2022-04-14 DIAGNOSIS — M5441 Lumbago with sciatica, right side: Secondary | ICD-10-CM

## 2022-04-14 MED ORDER — OXYCODONE HCL 15 MG PO TABS: 15.0000 mg | ORAL_TABLET | ORAL | 0 refills | Status: DC | PRN

## 2022-04-14 NOTE — Telephone Encounter (Signed)
Pt has called back in states has been trying to get this med almost a wk and is in a lot of pain. PT want med asp, if at all possible

## 2022-04-15 NOTE — Telephone Encounter (Signed)
Called pt, he is needing the OXYCODONE 15MG  sent to [facility]. Pt is upset because he is in a lot of pain and feels like he's getting the run around with his medication. Advised pt I will send HP message to PCP now.

## 2022-04-16 MED ORDER — OXYCODONE HCL 15 MG PO TABS: 15.0000 mg | ORAL_TABLET | ORAL | 0 refills | Status: DC | PRN

## 2022-04-16 NOTE — Telephone Encounter (Signed)
Patient needs rx's recent to [facility].

## 2022-04-16 NOTE — Addendum Note (Signed)
Addended by: Julieta Bellini on: 04/16/2022 08:52 AM   Modules accepted: Orders

## 2022-05-19 ENCOUNTER — Telehealth: Payer: Self-pay | Admitting: Family Medicine

## 2022-05-19 ENCOUNTER — Other Ambulatory Visit: Payer: Self-pay | Admitting: Family Medicine

## 2022-05-19 ENCOUNTER — Other Ambulatory Visit: Payer: Self-pay | Admitting: Physician Assistant

## 2022-05-19 DIAGNOSIS — G8929 Other chronic pain: Secondary | ICD-10-CM

## 2022-05-19 MED ORDER — OXYCODONE HCL 15 MG PO TABS: 15.0000 mg | ORAL_TABLET | ORAL | 0 refills | Status: AC | PRN

## 2022-05-19 NOTE — Telephone Encounter (Signed)
Pharmacy does not have Rx in stock- requesting forward to alternate pharmacy- non delegated medication- sent to office for review.

## 2022-05-19 NOTE — Telephone Encounter (Signed)
Katelyn from [facility] is calling because the patient has a refill for oxyCODONE (ROXICODONE) 15 MG immediate release tablet They are out of stock at [facility]. She was wanting to know if this [facility] [place]. She has tried contacting the patient but wasn't able to get an answer.

## 2022-05-19 NOTE — Telephone Encounter (Signed)
Pt called and stated that he needed a refill for the following medication:  Requested Prescriptions   Pending Prescriptions Disp Refills   oxyCODONE (ROXICODONE) 15 MG immediate release tablet 120 tablet 0    Sig: Take 1 tablet (15 mg total) by mouth every 4 (four) hours as needed for pain.   [facility] [place] [address]

## 2022-05-20 NOTE — Telephone Encounter (Signed)
Duplicate request. Prescription was sent to pharmacy yesterday. Please close out this encounter. I am not able to close out encounter due to medication being a controlled substance.

## 2022-05-20 NOTE — Telephone Encounter (Signed)
Requested medication (s) are due for refill today: no  Requested medication (s) are on the active medication list: yes  Last refill:  05/19/22  Future visit scheduled: yes  Notes to clinic:  Unable to refill per protocol, last refill by provider 6/62/94, possible duplicate.     Requested Prescriptions  Pending Prescriptions Disp Refills   oxyCODONE (ROXICODONE) 15 MG immediate release tablet 120 tablet 0    Sig: Take 1 tablet (15 mg total) by mouth every 4 (four) hours as needed for pain.     Not Delegated - Analgesics:  Opioid Agonists Failed - 05/19/2022  9:05 AM      Failed - This refill cannot be delegated      Failed - Urine Drug Screen completed in last 360 days      Passed - Valid encounter within last 3 months    Recent Outpatient Visits           1 month ago Chronic bilateral low back pain with bilateral sciatica   [facility] Jerrol Banana., MD   4 months ago Chronic bilateral low back pain with bilateral sciatica   [facility] Jerrol Banana., MD   5 months ago Chronic bilateral low back pain with bilateral sciatica   [facility] Jerrol Banana., MD   11 months ago Other chronic pain   [facility] Jerrol Banana., MD   1 year ago BPH associated with nocturia   [facility] Jerrol Banana., MD       Future Appointments             In 1 month Jerrol Banana., MD [facility], [place]

## 2022-06-12 DIAGNOSIS — H2513 Age-related nuclear cataract, bilateral: Secondary | ICD-10-CM | POA: Diagnosis not present

## 2022-06-16 DIAGNOSIS — L309 Dermatitis, unspecified: Secondary | ICD-10-CM | POA: Diagnosis not present

## 2022-06-30 ENCOUNTER — Other Ambulatory Visit: Payer: Self-pay | Admitting: Family Medicine

## 2022-07-11 NOTE — Progress Notes (Unsigned)
Established patient visit  I,Daniel Owens,acting as a scribe for Daniel Durie, MD.,have documented all relevant documentation on the behalf of Daniel Durie, MD,as directed by  Daniel Durie, MD while in the presence of Daniel Durie, MD.   Patient: Daniel Owens   DOB: Jul 15, 1946   76 y.o. Male  MRN: [id number] Visit Date: 07/14/2022  Today's healthcare provider: [name], MD   Chief Complaint  Patient presents with   Follow-up   Subjective    HPI  She comes in today for follow-up.  He states he never takes more of his oxycodone then as prescribed.  He does not call back early for it.  He says his neuropathy in his feet is worse and causes pain and numbness. Overall he is feeling older and is slowing down.  He takes medications as prescribed.  Talked about trying to cut back on oxycodone and Celebrex.  Follow up for Chronic bilateral low back pain with bilateral sciatica:  The patient was last seen for this 3 months ago. Changes made at last visit include refilled oxycodone x3.  -----------------------------------------------------------------------------------------   Medications: Outpatient Medications Prior to Visit  Medication Sig   alendronate (FOSAMAX) 70 MG tablet Take 1 tablet (70 mg total) by mouth every 7 (seven) days. Take with a full glass of water on an empty stomach.   Ascorbic Acid (VITAMIN C) 1000 MG tablet Take 1,000 mg by mouth daily.   B Complex Vitamins (VITAMIN B COMPLEX PO) Take 1 tablet by mouth daily.   beta carotene 25000 UNIT capsule Take 25,000 Units by mouth daily.   calcium carbonate (OS-CAL - DOSED IN MG OF ELEMENTAL CALCIUM) 1250 (500 Ca) MG tablet Take by mouth.   celecoxib (CELEBREX) 200 MG capsule Take 1 capsule (200 mg total) by mouth daily.   fish oil-omega-3 fatty acids 1000 MG capsule Take 1 g by mouth daily.   fluticasone (FLONASE) 50 MCG/ACT nasal spray Place 2 sprays into both nostrils daily.   folic  acid (FOLVITE) 865 MCG tablet Take 1 tablet (400 mcg total) by mouth daily.   GLUCOSAMINE-CHONDROIT-VIT C-MN PO Take by mouth daily.    losartan (COZAAR) 50 MG tablet TAKE 1 TABLET(50 MG) BY MOUTH DAILY   metoprolol succinate (TOPROL-XL) 25 MG 24 hr tablet TAKE 1 TABLET (25 MG TOTAL) BY MOUTH DAILY.   Multiple Vitamin (MULTIVITAMIN WITH MINERALS) TABS tablet Take 1 tablet by mouth daily.   omeprazole (PRILOSEC) 20 MG capsule Take 1 capsule (20 mg total) by mouth daily.   oxyCODONE (ROXICODONE) 15 MG immediate release tablet Take 1 tablet (15 mg total) by mouth every 4 (four) hours as needed for pain. Fill on 06/14/2022   oxyCODONE (ROXICODONE) 15 MG immediate release tablet Take 1 tablet (15 mg total) by mouth every 4 (four) hours as needed for pain. Fill on 05/14/2022   rosuvastatin (CRESTOR) 5 MG tablet TAKE 1 TABLET(5 MG) BY MOUTH DAILY   Selenium 200 MCG TABS Take 200 mcg by mouth daily.   tamsulosin (FLOMAX) 0.4 MG CAPS capsule Take 1 capsule (0.4 mg total) by mouth daily.   Zinc 50 MG CAPS Take 1 capsule by mouth daily.   No facility-administered medications prior to visit.    Review of Systems  Constitutional:  Negative for appetite change, chills and fever.  Respiratory:  Negative for chest tightness, shortness of breath and wheezing.   Cardiovascular:  Negative for chest pain and palpitations.  Gastrointestinal:  Negative  for abdominal pain, nausea and vomiting.    Last thyroid functions Lab Results  Component Value Date   TSH 1.570 07/14/2022       Objective    BP (!) 142/48 (BP Location: Left Arm, Patient Position: Sitting, Cuff Size: Normal)   Pulse (!) 59   Resp 16   Wt 153 lb (69.4 kg)   SpO2 100%   BMI 20.75 kg/m  BP Readings from Last 3 Encounters:  07/14/22 (!) 142/48  04/14/22 122/65  01/13/22 125/68   Wt Readings from Last 3 Encounters:  07/14/22 153 lb (69.4 kg)  04/14/22 149 lb 1.6 oz (67.6 kg)  04/08/22 152 lb (68.9 kg)      Physical Exam Vitals  reviewed.  Constitutional:      Appearance: Normal appearance.  HENT:     Head: Normocephalic and atraumatic.     Right Ear: External ear normal.     Left Ear: External ear normal.  Eyes:     General: No scleral icterus.    Conjunctiva/sclera: Conjunctivae normal.  Cardiovascular:     Rate and Rhythm: Normal rate and regular rhythm.     Pulses: Normal pulses.     Heart sounds: Normal heart sounds.     Comments: Pulses good. Large varicose veins. Chronic skin changes due to venous stasis. Pulmonary:     Effort: Pulmonary effort is normal.     Breath sounds: Normal breath sounds.  Musculoskeletal:     Right lower leg: No edema.     Left lower leg: No edema.  Skin:    General: Skin is warm and dry.     Capillary Refill: Capillary refill takes 2 to 3 seconds.  Neurological:     General: No focal deficit present.     Mental Status: He is alert and oriented to person, place, and time.  Psychiatric:        Mood and Affect: Mood normal.        Behavior: Behavior normal.        Thought Content: Thought content normal.        Judgment: Judgment normal.       No results found for any visits on 07/14/22.  Assessment & Plan     1. Essential hypertension May need to cut back on his blood pressure regimen in the future.  Would consider cutting back on losartan to 25 mg or discontinuing losartan - Hemoglobin A1c - TSH - Vitamin B12 - Folate  2. Neuropathy Problem.  Obtain A1c TSH and B12 and folate but  he has had this for years. - Hemoglobin A1c - TSH - Vitamin B12 - Folate  3. Hyperglycemia  - Hemoglobin A1c - TSH - Vitamin B12 - Folate  4. Chronic anemia Followed by hematology - Hemoglobin A1c - TSH - Vitamin B12 - Folate  5. Chronic bilateral low back pain with bilateral sciatica Refill meds.  He does not call back early.  Knows he will need to be seen for any further refills - oxyCODONE (ROXICODONE) 15 MG immediate release tablet; Take 1 tablet (15 mg total)  by mouth every 4 (four) hours as needed for pain.  Dispense: 120 tablet; Refill: 0 - oxyCODONE (ROXICODONE) 15 MG immediate release tablet; Take 1 tablet (15 mg total) by mouth every 4 (four) hours as needed for pain.  Dispense: 120 tablet; Refill: 0 - oxyCODONE (ROXICODONE) 15 MG immediate release tablet; Take 1 tablet (15 mg total) by mouth every 4 (four) hours as needed for  pain.  Dispense: 120 tablet; Refill: 0  6. Other chronic pain  - oxyCODONE (ROXICODONE) 15 MG immediate release tablet; Take 1 tablet (15 mg total) by mouth every 4 (four) hours as needed for pain.  Dispense: 120 tablet; Refill: 0 - oxyCODONE (ROXICODONE) 15 MG immediate release tablet; Take 1 tablet (15 mg total) by mouth every 4 (four) hours as needed for pain.  Dispense: 120 tablet; Refill: 0 - oxyCODONE (ROXICODONE) 15 MG immediate release tablet; Take 1 tablet (15 mg total) by mouth every 4 (four) hours as needed for pain.  Dispense: 120 tablet; Refill: 0  7. Varicose veins with pain   8. Aortic atherosclerosis (HCC) Risk factors treated.  Patient on rosuvastatin 5  9. Raynaud's disease without gangrene No symptoms recently.  Consider low-dose amlodipine at 2.5 but if we did this with his diastolic blood pressures are low would stop the losartan  10. Gastroesophageal reflux disease, unspecified whether esophagitis present Continue omeprazole  11. Closed compression fracture of thoracic vertebra with routine healing, subsequent encounter Continue alendronate and follow-up with repeat BMD in a few years   No follow-ups on file.      I, Daniel Durie, MD, have reviewed all documentation for this visit. The documentation on 07/16/22 for the exam, diagnosis, procedures, and orders are all accurate and complete.    Jatavian Calica Cranford Mon, MD  [facility] [phone] (phone) [phone] (fax)  [place]

## 2022-07-14 ENCOUNTER — Ambulatory Visit (INDEPENDENT_AMBULATORY_CARE_PROVIDER_SITE_OTHER): Payer: Medicare Other | Admitting: Family Medicine

## 2022-07-14 ENCOUNTER — Ambulatory Visit: Payer: Medicare Other | Admitting: Family Medicine

## 2022-07-14 ENCOUNTER — Encounter: Payer: Self-pay | Admitting: Family Medicine

## 2022-07-14 VITALS — BP 142/48 | HR 59 | Resp 16 | Wt 153.0 lb

## 2022-07-14 DIAGNOSIS — M5442 Lumbago with sciatica, left side: Secondary | ICD-10-CM

## 2022-07-14 DIAGNOSIS — Z23 Encounter for immunization: Secondary | ICD-10-CM

## 2022-07-14 DIAGNOSIS — I10 Essential (primary) hypertension: Secondary | ICD-10-CM | POA: Diagnosis not present

## 2022-07-14 DIAGNOSIS — I83819 Varicose veins of unspecified lower extremities with pain: Secondary | ICD-10-CM | POA: Diagnosis not present

## 2022-07-14 DIAGNOSIS — I700 Atherosclerosis of aorta: Secondary | ICD-10-CM

## 2022-07-14 DIAGNOSIS — M5441 Lumbago with sciatica, right side: Secondary | ICD-10-CM

## 2022-07-14 DIAGNOSIS — G629 Polyneuropathy, unspecified: Secondary | ICD-10-CM | POA: Diagnosis not present

## 2022-07-14 DIAGNOSIS — R739 Hyperglycemia, unspecified: Secondary | ICD-10-CM

## 2022-07-14 DIAGNOSIS — K219 Gastro-esophageal reflux disease without esophagitis: Secondary | ICD-10-CM | POA: Diagnosis not present

## 2022-07-14 DIAGNOSIS — S22000D Wedge compression fracture of unspecified thoracic vertebra, subsequent encounter for fracture with routine healing: Secondary | ICD-10-CM

## 2022-07-14 DIAGNOSIS — D649 Anemia, unspecified: Secondary | ICD-10-CM

## 2022-07-14 DIAGNOSIS — I7300 Raynaud's syndrome without gangrene: Secondary | ICD-10-CM

## 2022-07-14 DIAGNOSIS — G8929 Other chronic pain: Secondary | ICD-10-CM

## 2022-07-15 LAB — HEMOGLOBIN A1C
Est. average glucose Bld gHb Est-mCnc: 105 mg/dL
Hgb A1c MFr Bld: 5.3 % (ref 4.8–5.6)

## 2022-07-15 LAB — FOLATE: Folate: >20 ng/mL (ref >3.0)

## 2022-07-15 LAB — TSH: TSH: 1.57 u[IU]/mL (ref 0.450–4.500)

## 2022-07-15 LAB — VITAMIN B12: Vitamin B-12: 843 pg/mL (ref 232–1245)

## 2022-07-16 MED ORDER — OXYCODONE HCL 15 MG PO TABS: 15.0000 mg | ORAL_TABLET | ORAL | 0 refills | Status: DC | PRN

## 2022-08-05 ENCOUNTER — Other Ambulatory Visit: Payer: Self-pay | Admitting: Family Medicine

## 2022-08-06 ENCOUNTER — Ambulatory Visit: Payer: Medicare Other | Admitting: Family Medicine

## 2022-08-18 ENCOUNTER — Encounter (INDEPENDENT_AMBULATORY_CARE_PROVIDER_SITE_OTHER): Payer: Self-pay

## 2022-09-03 DIAGNOSIS — H52213 Irregular astigmatism, bilateral: Secondary | ICD-10-CM | POA: Diagnosis not present

## 2022-09-03 DIAGNOSIS — H2511 Age-related nuclear cataract, right eye: Secondary | ICD-10-CM | POA: Diagnosis not present

## 2022-09-03 DIAGNOSIS — H2513 Age-related nuclear cataract, bilateral: Secondary | ICD-10-CM | POA: Diagnosis not present

## 2022-09-03 DIAGNOSIS — H25013 Cortical age-related cataract, bilateral: Secondary | ICD-10-CM | POA: Diagnosis not present

## 2022-09-03 DIAGNOSIS — H31093 Other chorioretinal scars, bilateral: Secondary | ICD-10-CM | POA: Diagnosis not present

## 2022-09-03 DIAGNOSIS — H35361 Drusen (degenerative) of macula, right eye: Secondary | ICD-10-CM | POA: Diagnosis not present

## 2022-09-04 DIAGNOSIS — D531 Other megaloblastic anemias, not elsewhere classified: Secondary | ICD-10-CM | POA: Diagnosis not present

## 2022-09-04 DIAGNOSIS — D72820 Lymphocytosis (symptomatic): Secondary | ICD-10-CM | POA: Diagnosis not present

## 2022-09-16 DIAGNOSIS — H2512 Age-related nuclear cataract, left eye: Secondary | ICD-10-CM | POA: Diagnosis not present

## 2022-09-16 DIAGNOSIS — H25812 Combined forms of age-related cataract, left eye: Secondary | ICD-10-CM | POA: Diagnosis not present

## 2022-09-27 ENCOUNTER — Other Ambulatory Visit: Payer: Self-pay | Admitting: Family Medicine

## 2022-10-31 DIAGNOSIS — M5416 Radiculopathy, lumbar region: Secondary | ICD-10-CM | POA: Diagnosis not present

## 2022-10-31 DIAGNOSIS — M48062 Spinal stenosis, lumbar region with neurogenic claudication: Secondary | ICD-10-CM | POA: Diagnosis not present

## 2022-11-07 DIAGNOSIS — H1045 Other chronic allergic conjunctivitis: Secondary | ICD-10-CM | POA: Diagnosis not present

## 2022-11-10 DIAGNOSIS — Z85828 Personal history of other malignant neoplasm of skin: Secondary | ICD-10-CM | POA: Diagnosis not present

## 2022-11-10 DIAGNOSIS — D2261 Melanocytic nevi of right upper limb, including shoulder: Secondary | ICD-10-CM | POA: Diagnosis not present

## 2022-11-10 DIAGNOSIS — D2262 Melanocytic nevi of left upper limb, including shoulder: Secondary | ICD-10-CM | POA: Diagnosis not present

## 2022-11-10 DIAGNOSIS — C44319 Basal cell carcinoma of skin of other parts of face: Secondary | ICD-10-CM | POA: Diagnosis not present

## 2022-11-10 DIAGNOSIS — D485 Neoplasm of uncertain behavior of skin: Secondary | ICD-10-CM | POA: Diagnosis not present

## 2022-11-10 DIAGNOSIS — D225 Melanocytic nevi of trunk: Secondary | ICD-10-CM | POA: Diagnosis not present

## 2022-11-10 DIAGNOSIS — X32XXXA Exposure to sunlight, initial encounter: Secondary | ICD-10-CM | POA: Diagnosis not present

## 2022-11-10 DIAGNOSIS — L570 Actinic keratosis: Secondary | ICD-10-CM | POA: Diagnosis not present

## 2022-11-10 DIAGNOSIS — D2272 Melanocytic nevi of left lower limb, including hip: Secondary | ICD-10-CM | POA: Diagnosis not present

## 2022-11-15 ENCOUNTER — Other Ambulatory Visit: Payer: Self-pay | Admitting: Family Medicine

## 2022-11-17 DIAGNOSIS — H2511 Age-related nuclear cataract, right eye: Secondary | ICD-10-CM | POA: Diagnosis not present

## 2022-11-17 DIAGNOSIS — H25011 Cortical age-related cataract, right eye: Secondary | ICD-10-CM | POA: Diagnosis not present

## 2022-11-17 NOTE — Telephone Encounter (Signed)
Requested medication (s) are due for refill today expired Rx  Requested medication (s) are on the active medication list -yes  Future visit scheduled -no  Last refill: 10/15/21 #90 1RF  Notes to clinic: expired Rx  Requested Prescriptions  Pending Prescriptions Disp Refills   losartan (COZAAR) 50 MG tablet [Pharmacy Med Name: LOSARTAN POTASSIUM 50 MG TAB] 90 tablet 1    Sig: TAKE 1 TABLET BY MOUTH EVERY DAY     Cardiovascular:  Angiotensin Receptor Blockers Failed - 11/15/2022  8:24 AM      Failed - Cr in normal range and within 180 days    Creatinine, Ser  Date Value Ref Range Status  12/19/2021 1.06 0.76 - 1.27 mg/dL Final         Failed - K in normal range and within 180 days    Potassium  Date Value Ref Range Status  12/19/2021 4.5 3.5 - 5.2 mmol/L Final         Failed - Last BP in normal range    BP Readings from Last 1 Encounters:  07/14/22 (!) 142/48         Passed - Patient is not pregnant      Passed - Valid encounter within last 6 months    Recent Outpatient Visits           4 months ago Essential hypertension   [place] Jerrol Banana., MD   7 months ago Chronic bilateral low back pain with bilateral sciatica   [place] Jerrol Banana., MD   10 months ago Chronic bilateral low back pain with bilateral sciatica   [place] Jerrol Banana., MD   11 months ago Chronic bilateral low back pain with bilateral sciatica   [facility] Jerrol Banana., MD   1 year ago Other chronic pain   [place] Jerrol Banana., MD                 Requested Prescriptions  Pending Prescriptions Disp Refills   losartan (COZAAR) 50 MG tablet [Pharmacy Med Name: LOSARTAN POTASSIUM 50 MG TAB] 90 tablet 1    Sig: TAKE 1 TABLET BY MOUTH EVERY DAY     Cardiovascular:  Angiotensin Receptor Blockers  Failed - 11/15/2022  8:24 AM      Failed - Cr in normal range and within 180 days    Creatinine, Ser  Date Value Ref Range Status  12/19/2021 1.06 0.76 - 1.27 mg/dL Final         Failed - K in normal range and within 180 days    Potassium  Date Value Ref Range Status  12/19/2021 4.5 3.5 - 5.2 mmol/L Final         Failed - Last BP in normal range    BP Readings from Last 1 Encounters:  07/14/22 (!) 142/48         Passed - Patient is not pregnant      Passed - Valid encounter within last 6 months    Recent Outpatient Visits           4 months ago Essential hypertension   [place] Jerrol Banana., MD   7 months ago Chronic bilateral low back pain with bilateral sciatica   [facility] Jerrol Banana., MD   10 months ago Chronic  bilateral low back pain with bilateral sciatica   [facility] Jerrol Banana., MD   11 months ago Chronic bilateral low back pain with bilateral sciatica   [facility] Jerrol Banana., MD   1 year ago Other chronic pain   [place] Jerrol Banana., MD

## 2022-11-21 DIAGNOSIS — I10 Essential (primary) hypertension: Secondary | ICD-10-CM | POA: Diagnosis not present

## 2022-11-21 DIAGNOSIS — N401 Enlarged prostate with lower urinary tract symptoms: Secondary | ICD-10-CM | POA: Diagnosis not present

## 2022-11-21 DIAGNOSIS — R351 Nocturia: Secondary | ICD-10-CM | POA: Diagnosis not present

## 2022-11-21 DIAGNOSIS — E7800 Pure hypercholesterolemia, unspecified: Secondary | ICD-10-CM | POA: Diagnosis not present

## 2022-11-21 DIAGNOSIS — Z862 Personal history of diseases of the blood and blood-forming organs and certain disorders involving the immune mechanism: Secondary | ICD-10-CM | POA: Diagnosis not present

## 2022-11-21 DIAGNOSIS — I700 Atherosclerosis of aorta: Secondary | ICD-10-CM | POA: Diagnosis not present

## 2022-11-21 DIAGNOSIS — Z125 Encounter for screening for malignant neoplasm of prostate: Secondary | ICD-10-CM | POA: Diagnosis not present

## 2022-11-21 DIAGNOSIS — M170 Bilateral primary osteoarthritis of knee: Secondary | ICD-10-CM | POA: Diagnosis not present

## 2022-11-21 DIAGNOSIS — M8588 Other specified disorders of bone density and structure, other site: Secondary | ICD-10-CM | POA: Diagnosis not present

## 2022-11-25 DIAGNOSIS — H25811 Combined forms of age-related cataract, right eye: Secondary | ICD-10-CM | POA: Diagnosis not present

## 2022-11-25 DIAGNOSIS — H2511 Age-related nuclear cataract, right eye: Secondary | ICD-10-CM | POA: Diagnosis not present

## 2022-11-25 DIAGNOSIS — H25011 Cortical age-related cataract, right eye: Secondary | ICD-10-CM | POA: Diagnosis not present

## 2022-12-15 ENCOUNTER — Other Ambulatory Visit: Payer: Self-pay | Admitting: Family Medicine

## 2022-12-15 DIAGNOSIS — I700 Atherosclerosis of aorta: Secondary | ICD-10-CM

## 2022-12-16 NOTE — Telephone Encounter (Signed)
Provider no longer at practice Requested Prescriptions  Pending Prescriptions Disp Refills   rosuvastatin (CRESTOR) 5 MG tablet [Pharmacy Med Name: ROSUVASTATIN CALCIUM 5 MG TAB] 90 tablet 1    Sig: TAKE 1 TABLET BY MOUTH EVERY DAY     Cardiovascular:  Antilipid - Statins 2 Failed - 12/15/2022  1:11 PM      Failed - Cr in normal range and within 360 days    Creatinine, Ser  Date Value Ref Range Status  12/19/2021 1.06 0.76 - 1.27 mg/dL Final         Failed - Lipid Panel in normal range within the last 12 months    Cholesterol, Total  Date Value Ref Range Status  12/19/2021 135 100 - 199 mg/dL Final   LDL Chol Calc (NIH)  Date Value Ref Range Status  12/19/2021 47 0 - 99 mg/dL Final   HDL  Date Value Ref Range Status  12/19/2021 74 >39 mg/dL Final   Triglycerides  Date Value Ref Range Status  12/19/2021 69 0 - 149 mg/dL Final         Passed - Patient is not pregnant      Passed - Valid encounter within last 12 months    Recent Outpatient Visits           5 months ago Essential hypertension   [place] Eulas Post, MD   8 months ago Chronic bilateral low back pain with bilateral sciatica   [place] Eulas Post, MD   11 months ago Chronic bilateral low back pain with bilateral sciatica   [facility] Eulas Post, MD   12 months ago Chronic bilateral low back pain with bilateral sciatica   [facility] Eulas Post, MD   1 year ago Other chronic pain   [place] Eulas Post, MD

## 2022-12-23 DIAGNOSIS — C44319 Basal cell carcinoma of skin of other parts of face: Secondary | ICD-10-CM | POA: Diagnosis not present

## 2022-12-23 DIAGNOSIS — D2339 Other benign neoplasm of skin of other parts of face: Secondary | ICD-10-CM | POA: Diagnosis not present

## 2023-01-06 DIAGNOSIS — M79672 Pain in left foot: Secondary | ICD-10-CM | POA: Diagnosis not present

## 2023-01-06 DIAGNOSIS — M722 Plantar fascial fibromatosis: Secondary | ICD-10-CM | POA: Diagnosis not present

## 2023-01-08 DIAGNOSIS — M8588 Other specified disorders of bone density and structure, other site: Secondary | ICD-10-CM | POA: Diagnosis not present

## 2023-01-27 DIAGNOSIS — M79672 Pain in left foot: Secondary | ICD-10-CM | POA: Diagnosis not present

## 2023-01-27 DIAGNOSIS — M722 Plantar fascial fibromatosis: Secondary | ICD-10-CM | POA: Diagnosis not present

## 2023-02-18 DIAGNOSIS — M5010 Cervical disc disorder with radiculopathy, unspecified cervical region: Secondary | ICD-10-CM | POA: Diagnosis not present

## 2023-02-19 DIAGNOSIS — I700 Atherosclerosis of aorta: Secondary | ICD-10-CM | POA: Diagnosis not present

## 2023-02-19 DIAGNOSIS — R351 Nocturia: Secondary | ICD-10-CM | POA: Diagnosis not present

## 2023-02-19 DIAGNOSIS — E7800 Pure hypercholesterolemia, unspecified: Secondary | ICD-10-CM | POA: Diagnosis not present

## 2023-02-19 DIAGNOSIS — M8588 Other specified disorders of bone density and structure, other site: Secondary | ICD-10-CM | POA: Diagnosis not present

## 2023-02-19 DIAGNOSIS — Z862 Personal history of diseases of the blood and blood-forming organs and certain disorders involving the immune mechanism: Secondary | ICD-10-CM | POA: Diagnosis not present

## 2023-02-19 DIAGNOSIS — I10 Essential (primary) hypertension: Secondary | ICD-10-CM | POA: Diagnosis not present

## 2023-02-19 DIAGNOSIS — M5010 Cervical disc disorder with radiculopathy, unspecified cervical region: Secondary | ICD-10-CM | POA: Diagnosis not present

## 2023-02-19 DIAGNOSIS — N401 Enlarged prostate with lower urinary tract symptoms: Secondary | ICD-10-CM | POA: Diagnosis not present

## 2023-03-09 DIAGNOSIS — D649 Anemia, unspecified: Secondary | ICD-10-CM | POA: Diagnosis not present

## 2023-03-20 ENCOUNTER — Other Ambulatory Visit: Payer: Self-pay | Admitting: Family Medicine

## 2023-03-23 ENCOUNTER — Ambulatory Visit
Admission: EM | Admit: 2023-03-23 | Discharge: 2023-03-23 | Disposition: A | Discharge status: Home / Self Care | Payer: Medicare Other | Attending: Nurse Practitioner | Admitting: Nurse Practitioner

## 2023-03-23 ENCOUNTER — Encounter: Payer: Self-pay | Admitting: Emergency Medicine

## 2023-03-23 DIAGNOSIS — L03116 Cellulitis of left lower limb: Secondary | ICD-10-CM

## 2023-03-23 MED ORDER — CEPHALEXIN 500 MG PO CAPS
500.0000 mg | ORAL_CAPSULE | Freq: Four times a day (QID) | ORAL | 0 refills | Status: AC
Start: 2023-03-23 — End: 2023-03-30

## 2023-03-23 NOTE — Discharge Instructions (Signed)
Start Keflex 4 times a day for 7 days Keep wound clean and dry Follow-up with your PCP if your symptoms do not improve Please go to the ER if you develop any worsening symptoms

## 2023-03-23 NOTE — ED Triage Notes (Signed)
Pt hit his left shin on a couch 1 month ago and has a wound that has now healed. He has some redness, the area is tender and its warm to touch.

## 2023-03-23 NOTE — ED Provider Notes (Addendum)
[facility]    CSN: [id number] Arrival date & time: 03/23/23  0955      History   Chief Complaint Chief Complaint  Patient presents with   Leg Pain    HPI Daniel Owens is a 77 y.o. male presents for evaluation of a leg infection.  Patient reports about a month ago he cut his left shin on a couch.  He states it was a pretty good size gash but he has been trying to treated at home with hydrogen peroxide washes and a topical antibiotic ointment he was prescribed 5 years ago.  He states that seem to be healing well until a few days ago when he noticed some redness and warmth from the area.  No drainage, fevers or chills.  He is up-to-date on his tetanus from 2017.  No history of MRSA or diabetes.  No other concerns at this time   Leg Pain   Past Medical History:  Diagnosis Date   Abnormal stress test    a. 07/2017 ETT: Ex 10:15, no chest pain, HTN response, 2mm ST dep in II, II, aVF, V4-V6 - returned to baseline in 5 mins-->Intermediate risk study. b. Cardiac CTA normal without CAD (08/2017)   Aortic atherosclerosis (HCC)    a. Incidentally noted on Chest CT in 2016.   Carotid arterial disease (HCC)    a. 07/2014 carotid u/s: mild <50%, bilat ICA stenosis.   Hyperlipidemia    Hypertension    Macrocytic anemia    osteoarthritis    Palpitations    a. ? prior dx of PAF.   Raynauds syndrome    Systolic murmur    a. Reports h/o rheumatic fever as a child;  b. 07/2017 Echo: EF 55-60%, no rwma, triv AI, nl RV fxn.    Patient Active Problem List   Diagnosis Date Noted   Leg swelling 12/06/2021   Bilateral primary osteoarthritis of knee 11/28/2020   Pallor of extremity 08/03/2020   Varicose veins with pain 08/03/2020   Scoliosis, unspecified 03/30/2020   Primary osteoarthritis of right knee 03/22/2020   Strain of tendon of left rotator cuff 06/30/2019   Chronic anemia 11/09/2018   H/O macrocytic anemia 11/09/2018   Degenerative scoliosis 07/29/2017   DOE (dyspnea  on exertion) 07/17/2017   Heart murmur 07/17/2017   Abnormal EKG 07/17/2017   Carotid artery disease (HCC) 07/17/2017   Aortic atherosclerosis (HCC) 05/20/2017   Hyperlipemia 05/20/2017   Closed compression fracture of thoracic vertebra (HCC) 03/04/2017   Cervical disc disorder with radiculopathy 11/19/2016   BPH associated with nocturia 08/05/2016   Lumbar spondylosis 02/14/2016   Esophageal reflux 03/18/2015   Arthritis 03/18/2015   Back pain, chronic 03/18/2015   Cardiac conduction disorder 03/18/2015   Narrowing of intervertebral disc space 03/18/2015   Essential hypertension 03/18/2015   History of anemia 03/18/2015   History of atrial fibrillation 03/18/2015   Weak pulse 03/18/2015   Palpitations 03/18/2015   Peyronie's disease 03/18/2015   Raynaud's syndrome 03/18/2015   Complete rotator cuff rupture of left shoulder 05/12/2013    Past Surgical History:  Procedure Laterality Date   APPENDECTOMY     BACK SURGERY     January 2023   BILATERAL KNEE ARTHROSCOPY     HERNIA REPAIR     NECK SURGERY     involving disc   NOSE SURGERY     RETINAL DETACHMENT SURGERY     SHOULDER SURGERY     Right-x 3 overall   TENDON REPAIR  involving Tricep tendon       Home Medications    Prior to Admission medications   Medication Sig Start Date End Date Taking? Authorizing Provider  cephALEXin (KEFLEX) 500 MG capsule Take 1 capsule (500 mg total) by mouth 4 (four) times daily for 7 days. 03/23/23 03/30/23 Yes Radford Pax, NP  alendronate (FOSAMAX) 70 MG tablet Take 1 tablet (70 mg total) by mouth every 7 (seven) days. Take with a full glass of water on an empty stomach. 01/13/22   Bosie Clos, MD  Ascorbic Acid (VITAMIN C) 1000 MG tablet Take 1,000 mg by mouth daily.    [provider]  B Complex Vitamins (VITAMIN B COMPLEX PO) Take 1 tablet by mouth daily.    [provider]  beta carotene 16109 UNIT capsule Take 25,000 Units by mouth daily.    [provider]  calcium carbonate (OS-CAL - DOSED IN MG OF ELEMENTAL CALCIUM) 1250 (500 Ca) MG tablet Take by mouth.    [provider]  celecoxib (CELEBREX) 200 MG capsule TAKE 1 CAPSULE BY MOUTH EVERY DAY 09/29/22   Malva Limes, MD  fish oil-omega-3 fatty acids 1000 MG capsule Take 1 g by mouth daily.    [provider]  fluticasone (FLONASE) 50 MCG/ACT nasal spray SPRAY 2 SPRAYS INTO EACH NOSTRIL EVERY DAY 03/23/23   Malva Limes, MD  folic acid (FOLVITE) 400 MCG tablet Take 1 tablet (400 mcg total) by mouth daily. 07/30/17   Bosie Clos, MD  GLUCOSAMINE-CHONDROIT-VIT C-MN PO Take by mouth daily.  01/16/15   [provider]  losartan (COZAAR) 50 MG tablet TAKE 1 TABLET BY MOUTH EVERY DAY 11/18/22   Bacigalupo, Marzella Schlein, MD  metoprolol succinate (TOPROL-XL) 25 MG 24 hr tablet TAKE 1 TABLET (25 MG TOTAL) BY MOUTH DAILY. 07/01/22   Bosie Clos, MD  Multiple Vitamin (MULTIVITAMIN WITH MINERALS) TABS tablet Take 1 tablet by mouth daily.    [provider]  omeprazole (PRILOSEC) 20 MG capsule Take 1 capsule (20 mg total) by mouth daily. 10/15/21   Malva Limes, MD  oxyCODONE (ROXICODONE) 15 MG immediate release tablet Take 1 tablet (15 mg total) by mouth every 4 (four) hours as needed for pain. 07/16/22   Bosie Clos, MD  oxyCODONE (ROXICODONE) 15 MG immediate release tablet Take 1 tablet (15 mg total) by mouth every 4 (four) hours as needed for pain. 07/16/22   Bosie Clos, MD  oxyCODONE (ROXICODONE) 15 MG immediate release tablet Take 1 tablet (15 mg total) by mouth every 4 (four) hours as needed for pain. 07/16/22   Bosie Clos, MD  rosuvastatin (CRESTOR) 5 MG tablet TAKE 1 TABLET(5 MG) BY MOUTH DAILY 10/15/21   Malva Limes, MD  Selenium 200 MCG TABS Take 200 mcg by mouth daily.    [provider]  tamsulosin (FLOMAX) 0.4 MG CAPS capsule Take 1 capsule (0.4 mg total) by mouth daily. 10/15/21   Malva Limes,  MD  Zinc 50 MG CAPS Take 1 capsule by mouth daily.    [provider]    Family History Family History  Problem Relation Age of Onset   Stroke Mother    Uterine cancer Mother    Throat cancer Father    Allergic rhinitis Brother    Prostate cancer Maternal Uncle    Prostate cancer Paternal Uncle    Heart attack Maternal Grandmother    Heart attack Maternal Grandfather  Social History Social History   Tobacco Use   Smoking status: Former    Types: Cigarettes    Quit date: 10/19/1977    Years since quitting: 45.4   Smokeless tobacco: Current    Types: Chew   Tobacco comments:    trying to quit  Vaping Use   Vaping Use: Never used  Substance Use Topics   Alcohol use: Not Currently    Comment: very seldom- wine   Drug use: No     Allergies   Patient has no known allergies.   Review of Systems Review of Systems  Skin:  Positive for wound.     Physical Exam Triage Vital Signs ED Triage Vitals  Enc Vitals Group     BP 03/23/23 1108 (!) 131/59     Pulse Rate 03/23/23 1108 (!) 55     Resp 03/23/23 1108 16     Temp 03/23/23 1108 97.9 F (36.6 C)     Temp Source 03/23/23 1108 Oral     SpO2 03/23/23 1108 95 %     Weight --      Height --      Head Circumference --      Peak Flow --      Pain Score 03/23/23 1107 1     Pain Loc --      Pain Edu? --      Excl. in GC? --    No data found.  Updated Vital Signs BP (!) 131/59 (BP Location: Left Arm)   Pulse (!) 55   Temp 97.9 F (36.6 C) (Oral)   Resp 16   SpO2 95%   Visual Acuity Right Eye Distance:   Left Eye Distance:   Bilateral Distance:    Right Eye Near:   Left Eye Near:    Bilateral Near:     Physical Exam Vitals and nursing note reviewed.  Constitutional:      General: He is not in acute distress.    Appearance: Normal appearance. He is not ill-appearing, toxic-appearing or diaphoretic.  HENT:     Head: Normocephalic and atraumatic.  Eyes:     Pupils: Pupils are equal,  round, and reactive to light.  Cardiovascular:     Rate and Rhythm: Normal rate.  Pulmonary:     Effort: Pulmonary effort is normal.  Skin:    General: Skin is warm and dry.          Comments: 3 cm scabbed wound to the mid left shin with mild swelling, erythema, and warmth extending primarily distally.  No active drainage.  Area is tender to palpation.  Neurological:     General: No focal deficit present.     Mental Status: He is alert and oriented to person, place, and time.  Psychiatric:        Mood and Affect: Mood normal.        Behavior: Behavior normal.      UC Treatments / Results  Labs (all labs ordered are listed, but only abnormal results are displayed) Labs Reviewed - No data to display  Comprehensive Metabolic Panel (CMP) Order: [id number] Component Ref Range & Units 2 wk ago  Sodium 135 - 145 mmol/L 132 Low   Potassium 3.5 - 5.0 mmol/L 4.8  Chloride 98 - 108 mmol/L 98  Carbon Dioxide (CO2) 21 - 30 mmol/L 27  Urea Nitrogen (BUN) 7 - 20 mg/dL 30 High   Creatinine 0.6 - 1.3 mg/dL 1.3  Glucose 70 - 409 mg/dL 91  Comment: Interpretive Data: Above is the NONFASTING reference range.  Below are the FASTING reference ranges: NORMAL:      70-99 mg/dL PREDIABETES: 161-096 mg/dL DIABETES:    > 045 mg/dL  Calcium 8.7 - 40.9 mg/dL 9.4  AST (Aspartate Aminotransferase) 15 - 41 U/L 25  ALT (Alanine Aminotransferase) 15 - 50 U/L 20  Bilirubin, Total 0.4 - 1.5 mg/dL 0.7  Alk Phos (Alkaline Phosphatase) 24 - 110 U/L 50  Albumin 3.5 - 4.8 g/dL 3.9  Protein, Total 6.2 - 8.1 g/dL 6.6  Anion Gap 3 - 12 mmol/L 7  BUN/CREA Ratio 6 - 27 23  Glomerular Filtration Rate (eGFR) mL/min/1.73sq m 57  Comment: CKD-EPI (2021) does not include patient's race in the calculation of eGFR. Monitoring changes of plasma creatinine and eGFR over time is useful for monitoring kidney function.  This change was made on 12/18/2020.  Interpretive Ranges for eGFR(CKD-EPI  2021):  eGFR:              > 60 mL/min/1.73 sq m - Normal eGFR:              30 - 59 mL/min/1.73 sq m - Moderately Decreased eGFR:              15 - 29 mL/min/1.73 sq m - Severely Decreased eGFR:              < 15 mL/min/1.73 sq m -  Kidney Failure   Note: These eGFR calculations do not apply in acute situations when eGFR is changing rapidly or in patients on dialysis.  Resulting Agency [facility]   Specimen Collected: 03/09/23 12:28   Performed by: [facility] Last Resulted: 03/09/23 13:50    EKG   Radiology No results found.  Procedures Procedures (including critical care time)  Medications Ordered in UC Medications - No data to display  Initial Impression / Assessment and Plan / UC Course  I have reviewed the triage vital signs and the nursing notes.  Pertinent labs & imaging results that were available during my care of the patient were reviewed by me and considered in my medical decision making (see chart for details).     Reviewed exam and symptoms with patient.  Discussed cellulitis.  Start Keflex 4 times a day for 7 days Tetanus is up-to-date Discussed wound care PCP follow-up if symptoms do not improve ER precautions reviewed and patient verbalized understanding Final Clinical Impressions(s) / UC Diagnoses   Final diagnoses:  Cellulitis of leg, left     Discharge Instructions      Start Keflex 4 times a day for 7 days Keep wound clean and dry Follow-up with your PCP if your symptoms do not improve Please go to the ER if you develop any worsening symptoms   ED Prescriptions     Medication Sig Dispense Auth. Provider   cephALEXin (KEFLEX) 500 MG capsule Take 1 capsule (500 mg total) by mouth 4 (four) times daily for 7 days. 28 capsule Radford Pax, NP      I have reviewed the PDMP during this encounter.   Radford Pax, NP 03/23/23 1126    Radford Pax, NP 03/23/23 [phone]

## 2023-03-23 NOTE — Telephone Encounter (Signed)
Requested Prescriptions  Pending Prescriptions Disp Refills   fluticasone (FLONASE) 50 MCG/ACT nasal spray [Pharmacy Med Name: FLUTICASONE PROP 50 MCG SPRAY] 48 mL 0    Sig: SPRAY 2 SPRAYS INTO EACH NOSTRIL EVERY DAY     Ear, Nose, and Throat: Nasal Preparations - Corticosteroids Passed - 03/20/2023  9:34 PM      Passed - Valid encounter within last 12 months    Recent Outpatient Visits           8 months ago Essential hypertension   [place] [facility] Bosie Clos, MD   11 months ago Chronic bilateral low back pain with bilateral sciatica   [place] [facility] Bosie Clos, MD   1 year ago Chronic bilateral low back pain with bilateral sciatica   [place] [facility] Bosie Clos, MD   1 year ago Chronic bilateral low back pain with bilateral sciatica   [place] [facility] Bosie Clos, MD   1 year ago Other chronic pain   [place] [facility] Bosie Clos, MD

## 2023-03-27 DIAGNOSIS — S8992XA Unspecified injury of left lower leg, initial encounter: Secondary | ICD-10-CM | POA: Diagnosis not present

## 2023-03-27 DIAGNOSIS — L03116 Cellulitis of left lower limb: Secondary | ICD-10-CM | POA: Diagnosis not present

## 2023-04-16 DIAGNOSIS — H26493 Other secondary cataract, bilateral: Secondary | ICD-10-CM | POA: Diagnosis not present

## 2023-04-22 DIAGNOSIS — Z1211 Encounter for screening for malignant neoplasm of colon: Secondary | ICD-10-CM | POA: Diagnosis not present

## 2023-04-22 DIAGNOSIS — D175 Benign lipomatous neoplasm of intra-abdominal organs: Secondary | ICD-10-CM | POA: Diagnosis not present

## 2023-04-22 DIAGNOSIS — K449 Diaphragmatic hernia without obstruction or gangrene: Secondary | ICD-10-CM | POA: Diagnosis not present

## 2023-04-22 DIAGNOSIS — D125 Benign neoplasm of sigmoid colon: Secondary | ICD-10-CM | POA: Diagnosis not present

## 2023-04-22 DIAGNOSIS — R09A2 Foreign body sensation, throat: Secondary | ICD-10-CM | POA: Diagnosis not present

## 2023-04-22 DIAGNOSIS — K5730 Diverticulosis of large intestine without perforation or abscess without bleeding: Secondary | ICD-10-CM | POA: Diagnosis not present

## 2023-04-22 DIAGNOSIS — F458 Other somatoform disorders: Secondary | ICD-10-CM | POA: Diagnosis not present

## 2023-04-22 DIAGNOSIS — K640 First degree hemorrhoids: Secondary | ICD-10-CM | POA: Diagnosis not present

## 2023-05-21 DIAGNOSIS — Z862 Personal history of diseases of the blood and blood-forming organs and certain disorders involving the immune mechanism: Secondary | ICD-10-CM | POA: Diagnosis not present

## 2023-05-21 DIAGNOSIS — I700 Atherosclerosis of aorta: Secondary | ICD-10-CM | POA: Diagnosis not present

## 2023-05-21 DIAGNOSIS — M5010 Cervical disc disorder with radiculopathy, unspecified cervical region: Secondary | ICD-10-CM | POA: Diagnosis not present

## 2023-05-21 DIAGNOSIS — L03116 Cellulitis of left lower limb: Secondary | ICD-10-CM | POA: Diagnosis not present

## 2023-05-21 DIAGNOSIS — I10 Essential (primary) hypertension: Secondary | ICD-10-CM | POA: Diagnosis not present

## 2023-06-02 DIAGNOSIS — L03116 Cellulitis of left lower limb: Secondary | ICD-10-CM | POA: Diagnosis not present

## 2023-06-02 DIAGNOSIS — M5010 Cervical disc disorder with radiculopathy, unspecified cervical region: Secondary | ICD-10-CM | POA: Diagnosis not present

## 2023-06-02 DIAGNOSIS — Z862 Personal history of diseases of the blood and blood-forming organs and certain disorders involving the immune mechanism: Secondary | ICD-10-CM | POA: Diagnosis not present

## 2023-06-02 DIAGNOSIS — I700 Atherosclerosis of aorta: Secondary | ICD-10-CM | POA: Diagnosis not present

## 2023-06-02 DIAGNOSIS — M170 Bilateral primary osteoarthritis of knee: Secondary | ICD-10-CM | POA: Diagnosis not present

## 2023-06-11 DIAGNOSIS — M722 Plantar fascial fibromatosis: Secondary | ICD-10-CM | POA: Diagnosis not present

## 2023-06-11 DIAGNOSIS — G609 Hereditary and idiopathic neuropathy, unspecified: Secondary | ICD-10-CM | POA: Diagnosis not present

## 2023-06-11 DIAGNOSIS — M19071 Primary osteoarthritis, right ankle and foot: Secondary | ICD-10-CM | POA: Diagnosis not present

## 2023-06-11 DIAGNOSIS — M79671 Pain in right foot: Secondary | ICD-10-CM | POA: Diagnosis not present

## 2023-06-11 DIAGNOSIS — M19072 Primary osteoarthritis, left ankle and foot: Secondary | ICD-10-CM | POA: Diagnosis not present

## 2023-08-11 DIAGNOSIS — D225 Melanocytic nevi of trunk: Secondary | ICD-10-CM | POA: Diagnosis not present

## 2023-08-11 DIAGNOSIS — L821 Other seborrheic keratosis: Secondary | ICD-10-CM | POA: Diagnosis not present

## 2023-08-11 DIAGNOSIS — L853 Xerosis cutis: Secondary | ICD-10-CM | POA: Diagnosis not present

## 2023-08-11 DIAGNOSIS — Z85828 Personal history of other malignant neoplasm of skin: Secondary | ICD-10-CM | POA: Diagnosis not present

## 2023-08-11 DIAGNOSIS — D2261 Melanocytic nevi of right upper limb, including shoulder: Secondary | ICD-10-CM | POA: Diagnosis not present

## 2023-08-11 DIAGNOSIS — I872 Venous insufficiency (chronic) (peripheral): Secondary | ICD-10-CM | POA: Diagnosis not present

## 2023-08-11 DIAGNOSIS — D2272 Melanocytic nevi of left lower limb, including hip: Secondary | ICD-10-CM | POA: Diagnosis not present

## 2023-08-11 DIAGNOSIS — D2271 Melanocytic nevi of right lower limb, including hip: Secondary | ICD-10-CM | POA: Diagnosis not present

## 2023-08-11 DIAGNOSIS — L570 Actinic keratosis: Secondary | ICD-10-CM | POA: Diagnosis not present

## 2023-08-11 DIAGNOSIS — D2262 Melanocytic nevi of left upper limb, including shoulder: Secondary | ICD-10-CM | POA: Diagnosis not present

## 2023-08-11 DIAGNOSIS — D0462 Carcinoma in situ of skin of left upper limb, including shoulder: Secondary | ICD-10-CM | POA: Diagnosis not present

## 2023-08-11 DIAGNOSIS — D485 Neoplasm of uncertain behavior of skin: Secondary | ICD-10-CM | POA: Diagnosis not present

## 2023-08-20 DIAGNOSIS — M47816 Spondylosis without myelopathy or radiculopathy, lumbar region: Secondary | ICD-10-CM | POA: Diagnosis not present

## 2023-08-20 DIAGNOSIS — I872 Venous insufficiency (chronic) (peripheral): Secondary | ICD-10-CM | POA: Diagnosis not present

## 2023-08-20 DIAGNOSIS — M5010 Cervical disc disorder with radiculopathy, unspecified cervical region: Secondary | ICD-10-CM | POA: Diagnosis not present

## 2023-08-20 DIAGNOSIS — I700 Atherosclerosis of aorta: Secondary | ICD-10-CM | POA: Diagnosis not present

## 2023-08-20 DIAGNOSIS — Z23 Encounter for immunization: Secondary | ICD-10-CM | POA: Diagnosis not present

## 2023-08-20 DIAGNOSIS — I10 Essential (primary) hypertension: Secondary | ICD-10-CM | POA: Diagnosis not present

## 2023-09-14 DIAGNOSIS — D0462 Carcinoma in situ of skin of left upper limb, including shoulder: Secondary | ICD-10-CM | POA: Diagnosis not present

## 2023-10-06 DIAGNOSIS — J300 Vasomotor rhinitis: Secondary | ICD-10-CM | POA: Diagnosis not present

## 2023-10-06 DIAGNOSIS — K121 Other forms of stomatitis: Secondary | ICD-10-CM | POA: Diagnosis not present

## 2023-11-18 ENCOUNTER — Encounter: Payer: Self-pay | Admitting: Internal Medicine

## 2023-11-18 ENCOUNTER — Ambulatory Visit: Payer: Medicare Other

## 2023-11-18 ENCOUNTER — Ambulatory Visit: Payer: Medicare Other | Attending: Internal Medicine | Admitting: Internal Medicine

## 2023-11-18 VITALS — BP 118/50 | HR 56 | Ht 72.0 in | Wt 157.8 lb

## 2023-11-18 DIAGNOSIS — I10 Essential (primary) hypertension: Secondary | ICD-10-CM | POA: Diagnosis present

## 2023-11-18 DIAGNOSIS — R0609 Other forms of dyspnea: Secondary | ICD-10-CM | POA: Diagnosis present

## 2023-11-18 DIAGNOSIS — R42 Dizziness and giddiness: Secondary | ICD-10-CM | POA: Diagnosis present

## 2023-11-18 DIAGNOSIS — I700 Atherosclerosis of aorta: Secondary | ICD-10-CM | POA: Diagnosis not present

## 2023-11-18 DIAGNOSIS — R002 Palpitations: Secondary | ICD-10-CM | POA: Diagnosis present

## 2023-11-18 DIAGNOSIS — R600 Localized edema: Secondary | ICD-10-CM | POA: Insufficient documentation

## 2023-11-18 MED ORDER — METOPROLOL SUCCINATE ER 25 MG PO TB24: 12.5000 mg | ORAL_TABLET | Freq: Every day | ORAL | 3 refills | Status: AC

## 2023-11-18 NOTE — Progress Notes (Signed)
Cardiology Office Note:  .   Date:  11/18/2023  ID:  Daniel Owens, DOB 02/21/1946, MRN [id number] PCP: Daniel Clos, MD  [facility] Providers Cardiologist:  Yvonne Kendall, MD     History of Present Illness: .   Daniel Owens is a 78 y.o. male with history of hypertension, mild carotid artery stenosis, mildly dilated thoracic aorta (stable since CTA chest in 2018), palpitations, and heart murmur, who presents for follow-up of hypertension.  I last saw him in 11/2021, when he was most concerned about back pain, having suffered a compression fracture and treated by kyphoplasty.  Unfortunately, his back pain had not improved much.  Chronic lower extremity edema was unchanged from prior visits, with Daniel Owens having stopped furosemide due to leg cramps.  He was not checking his blood pressures regularly at home.  No further intervention or testing was pursued.  Today, Daniel Owens is most concerned about intermittent palpitations that he attributes to atrial fibrillation.  He believes he was told that he had atrial fibrillation about 30 years ago and underwent evaluation by Dr. Juliann Pares at least 15 years ago.  He notes that his palpitations feel like his heart is skipping beats or beating irregularly, usually only lasting about 5 seconds.  He has not had any chest pain.  He notes some lightheadedness and dyspnea when walking.  This is a bit more pronounced than at his last visit.  He also feels like his legs are frequently swollen towards the [place] of the day.  He wears compression stockings to help with this.  He has not passed out or fallen.  ROS: See HPI  Studies Reviewed: Marland Kitchen   EKG Interpretation Date/Time:  Wednesday November 18 2023 11:06:27 EST Ventricular Rate:  56 PR Interval:  202 QRS Duration:  80 QT Interval:  418 QTC Calculation: 403 R Axis:   15  Text Interpretation: Sinus bradycardia Possible Septal infarct , age undetermined versus lead placement When compared with ECG of  05-Dec-2021 Poor R wave progression is now Present Septal leads Confirmed by Shaela Boer, Cristal Deer [phone]) on 11/18/2023 11:10:13 AM    Risk Assessment/Calculations:             Physical Exam:   VS:  BP (!) 118/50 (BP Location: Left Arm, Cuff Size: Normal)   Pulse (!) 56   Ht 6' (1.829 m)   Wt 157 lb 12.8 oz (71.6 kg)   SpO2 98%   BMI 21.40 kg/m    Wt Readings from Last 3 Encounters:  11/18/23 157 lb 12.8 oz (71.6 kg)  07/14/22 153 lb (69.4 kg)  04/14/22 149 lb 1.6 oz (67.6 kg)    General:  NAD. Neck: No JVD or HJR. Lungs: Clear to auscultation bilaterally without wheezes or crackles. Heart: Regular rate and rhythm without murmurs, rubs, or gallops. Abdomen: Soft, nontender, nondistended. Extremities: Trace pretibial edema noted bilaterally.  ASSESSMENT AND PLAN: .    Dyspnea on exertion, palpitations, and lightheadedness: Symptoms are nonspecific.  Daniel Owens is fairly adamant that he carries a diagnosis of atrial fibrillation, though workup surrounding this diagnosis is unclear.  His EKG today shows sinus rhythm.  I have recommended a 14-day event monitor (ZIO XT) to help better understand his palpitations and ensure that he does not have atrial fibrillation as he would qualify for anticoagulation if that were the case.  Echocardiogram in 05/2021 was reassuring with normal LVEF and no significant structural abnormalities.  I will reduce his metoprolol succinate to 12.5  mg daily in the setting of mild bradycardia and borderline low blood pressure coupled with intermittent lightheadedness.  If his symptoms persist and event monitor is unrevealing, we may need to consider repeating an echo as well as coronary CTA.  Leg edema: This has been a chronic issue.  I suspect it is multifactorial.  I encouraged Daniel Owens to keep elevating his legs and to use compression stockings when possible.  I am reluctant to put him on a standing diuretic given his report of lightheadedness as well as low normal  blood pressure today.  Hypertension: Blood pressure borderline low today, which Daniel Owens states is quite atypical for him.  He notes that his systolic readings at home are typically at least 130 mmHg.  Given that he has been somewhat lightheaded at times, and has mild bradycardia today, I have recommended reducing metoprolol succinate to 12.5 mg daily.  Will continue his current dose of losartan.  I have asked him to keep monitoring his blood pressure at home.  Aortic atherosclerosis: Continue rosuvastatin 5 mg daily at the direction of Dr. Sullivan Lone.    Dispo: Return to clinic in 6 weeks to reassess blood pressure and review monitor results.   Signed, Yvonne Kendall, MD

## 2023-11-18 NOTE — Patient Instructions (Addendum)
Medication Instructions:  Your physician recommends the following medication changes.  DECREASE: Metoprolol to 12.5 mg (1/2 tablet) mg by mouth daily  *If you need a refill on your cardiac medications before your next appointment, please call your pharmacy*   Lab Work: No labs ordered today    Testing/Procedures: Your physician has recommended that you wear a 14 day Zio monitor.   This monitor is a medical device that records the heart's electrical activity. Doctors most often use these monitors to diagnose arrhythmias. Arrhythmias are problems with the speed or rhythm of the heartbeat. The monitor is a small device applied to your chest. You can wear one while you do your normal daily activities. While wearing this monitor if you have any symptoms to push the button and record what you felt. Once you have worn this monitor for the period of time provider prescribed (Usually 14 days), you will return the monitor device in the postage paid box. Once it is returned they will download the data collected and provide [place] with a report which the provider will then review and we will call you with those results. Important tips:  Avoid showering during the first 24 hours of wearing the monitor. Avoid excessive sweating to help maximize wear time. Do not submerge the device, no hot tubs, and no swimming pools. Keep any lotions or oils away from the patch. After 24 hours you may shower with the patch on. Take brief showers with your back facing the shower head.  Do not remove patch once it has been placed because that will interrupt data and decrease adhesive wear time. Push the button when you have any symptoms and write down what you were feeling. Once you have completed wearing your monitor, remove and place into box which has postage paid and place in your outgoing mailbox.  If for some reason you have misplaced your box then call our office and we can provide another box and/or mail it off for  you.      Follow-Up: At [facility], you and your health needs are our priority.  As part of our continuing mission to provide you with exceptional heart care, we have created designated Provider Care Teams.  These Care Teams include your primary Cardiologist (physician) and Advanced Practice Providers (APPs -  Physician Assistants and Nurse Practitioners) who all work together to provide you with the care you need, when you need it.  We recommend signing up for the patient portal called "MyChart".  Sign up information is provided on this After Visit Summary.  MyChart is used to connect with patients for Virtual Visits (Telemedicine).  Patients are able to view lab/test results, encounter notes, upcoming appointments, etc.  Non-urgent messages can be sent to your provider as well.   To learn more about what you can do with MyChart, go to [email or web address].    Your next appointment:   6 week(s)  Provider:   You may see Yvonne Kendall, MD or one of the following Advanced Practice Providers on your designated Care Team:   Nicolasa Ducking, NP Eula Listen, PA-C Cadence Fransico Michael, PA-C Charlsie Quest, NP Carlos Levering, NP

## 2023-11-20 ENCOUNTER — Telehealth: Payer: Self-pay | Admitting: Internal Medicine

## 2023-11-20 NOTE — Telephone Encounter (Signed)
Spoke with patient and he was not able to pull up the app on his phone and was just not understanding of all the steps in order to place the heart monitor. He would like to come in and have someone help him with placement. Offered to assist if he can come on Monday early and I would be able to assist. He verbalized agreement with no further needs.

## 2023-11-20 NOTE — Telephone Encounter (Signed)
Patient calling in to get someone to put the monitor on him. Please advise

## 2023-11-23 DIAGNOSIS — R002 Palpitations: Secondary | ICD-10-CM

## 2023-11-23 DIAGNOSIS — R42 Dizziness and giddiness: Secondary | ICD-10-CM | POA: Diagnosis not present

## 2023-12-30 ENCOUNTER — Ambulatory Visit: Payer: Medicare Other | Attending: Student | Admitting: Student

## 2023-12-30 ENCOUNTER — Encounter: Payer: Self-pay | Admitting: Student

## 2023-12-30 ENCOUNTER — Ambulatory Visit: Payer: Medicare Other | Admitting: Student

## 2023-12-30 VITALS — BP 125/50 | HR 65 | Ht 72.0 in | Wt 156.6 lb

## 2023-12-30 DIAGNOSIS — E7800 Pure hypercholesterolemia, unspecified: Secondary | ICD-10-CM | POA: Diagnosis present

## 2023-12-30 DIAGNOSIS — Z79899 Other long term (current) drug therapy: Secondary | ICD-10-CM | POA: Diagnosis not present

## 2023-12-30 DIAGNOSIS — R42 Dizziness and giddiness: Secondary | ICD-10-CM | POA: Insufficient documentation

## 2023-12-30 DIAGNOSIS — I509 Heart failure, unspecified: Secondary | ICD-10-CM | POA: Insufficient documentation

## 2023-12-30 DIAGNOSIS — R600 Localized edema: Secondary | ICD-10-CM | POA: Insufficient documentation

## 2023-12-30 DIAGNOSIS — I10 Essential (primary) hypertension: Secondary | ICD-10-CM | POA: Insufficient documentation

## 2023-12-30 DIAGNOSIS — R0989 Other specified symptoms and signs involving the circulatory and respiratory systems: Secondary | ICD-10-CM | POA: Insufficient documentation

## 2023-12-30 DIAGNOSIS — R0609 Other forms of dyspnea: Secondary | ICD-10-CM | POA: Diagnosis not present

## 2023-12-30 MED ORDER — METOPROLOL TARTRATE 50 MG PO TABS
ORAL_TABLET | ORAL | 0 refills | Status: DC
Start: 2023-12-30 — End: 2024-02-08

## 2023-12-30 NOTE — Patient Instructions (Signed)
 Medication Instructions:  Your physician recommends the following medication changes.  TAKE: Metoprolol Tartrate 50 mg about 2 hours before your CTA  *If you need a refill on your cardiac medications before your next appointment, please call your pharmacy*   Lab Work: Your provider would like for you to have following labs drawn today CBC and BMeT.   If you have labs (blood work) drawn today and your tests are completely normal, you will receive your results only by: MyChart Message (if you have MyChart) OR A paper copy in the mail If you have any lab test that is abnormal or we need to change your treatment, we will call you to review the results.   Testing/Procedures:  Your cardiac CT will be scheduled at the below location:   [facility] [address] [place], [place] [address] [phone]  If scheduled at [facility] please arrive 15 mins early for check-in and test prep.  Please follow these instructions carefully (unless otherwise directed):  An IV will be required for this test and Nitroglycerin will be given.  Hold all erectile dysfunction medications at least 3 days (72 hrs) prior to test. (Ie viagra, cialis, sildenafil, tadalafil, etc)   On the Night Before the Test: Be sure to Drink plenty of water. Do not consume any caffeinated/decaffeinated beverages or chocolate 12 hours prior to your test. Do not take any antihistamines or cold medicines 12 hours prior to your test.  On the Day of the Test: Drink plenty of water until 1 hour prior to the test. Do not eat any food 1 hour prior to test. You may take your regular medications prior to the test.  Take metoprolol (Lopressor) two hours prior to test.      After the Test: Drink plenty of water. After receiving IV contrast, you may experience a mild flushed feeling. This is normal. On occasion, you may experience a mild rash up to 24 hours  after the test. This is not dangerous. If this occurs, you can take Benadryl 25 mg, Zyrtec, Claritin, or Allegra and increase your fluid intake. (Patients taking Tikosyn should avoid Benadryl, and may take Zyrtec, Claritin, or Allegra) If you experience trouble breathing, this can be serious. If it is severe call 911 IMMEDIATELY. If it is mild, please call our office.  We will call to schedule your test 2-4 weeks out understanding that some insurance companies will need an authorization prior to the service being performed.   For more information and frequently asked questions, please visit our website : [email or web address]  For non-scheduling related questions, please contact the cardiac imaging nurse navigator should you have any questions/concerns: Cardiac Imaging Nurse Navigators Direct Office Dial: [phone]   For scheduling needs, including cancellations and rescheduling, please call [place], [phone].  Your physician has requested that you have an echocardiogram. Echocardiography is a painless test that uses sound waves to create images of your heart. It provides your doctor with information about the size and shape of your heart and how well your heart's chambers and valves are working.   You may receive an ultrasound enhancing agent through an IV if needed to better visualize your heart during the echo. This procedure takes approximately one hour.  There are no restrictions for this procedure.  This will take place at [address] [facility], [place] [address]  Please note: We ask at that you not bring children with you during ultrasound (echo/ vascular)  testing. Due to room size and safety concerns, children are not allowed in the ultrasound rooms during exams. Our front office staff cannot provide observation of children in our lobby area while testing is being conducted. An adult accompanying a patient to their appointment will only be  allowed in the ultrasound room at the discretion of the ultrasound technician under special circumstances. We apologize for any inconvenience.   Follow-Up: At [facility], you and your health needs are our priority.  As part of our continuing mission to provide you with exceptional heart care, we have created designated Provider Care Teams.  These Care Teams include your primary Cardiologist (physician) and Advanced Practice Providers (APPs -  Physician Assistants and Nurse Practitioners) who all work together to provide you with the care you need, when you need it.  We recommend signing up for the patient portal called "MyChart".  Sign up information is provided on this After Visit Summary.  MyChart is used to connect with patients for Virtual Visits (Telemedicine).  Patients are able to view lab/test results, encounter notes, upcoming appointments, etc.  Non-urgent messages can be sent to your provider as well.   To learn more about what you can do with MyChart, go to [email or web address].    Your next appointment:   6 week(s)  Provider:   You may see Yvonne Kendall, MD or Carlos Levering, NP

## 2023-12-30 NOTE — Progress Notes (Signed)
 Cardiology Clinic Note   Date: 12/30/2023 ID: Daniel Owens, Daniel Owens 24-Nov-1945, MRN [id number]  Primary Cardiologist:  Yvonne Kendall, MD  Chief Complaint   Daniel Owens is a 78 y.o. male who presents to the clinic today for follow up after testing.   Patient Profile   Daniel Owens is followed by Dr. Okey Dupre for the history outlined below.      Past medical history significant for: Palpitations. 14-day ZIO 11/18/2023: HR 44 to 156 bpm, average 62 bpm.  First-degree AV block was noted.  Predominantly sinus rhythm.  Rare PACs/PVCs.  5 runs of SVT lasting up to 8 beats with max rate 156 bpm.  No sustained arrhythmia or prolonged pauses.  No A-fib. Lower extremity edema. Dilatation of thoracic aorta. Echo 06/19/2021: EF 55 to 60%.  No RWMA.  Normal RV size/function.  Mild dilatation of aortic root 40 mm.  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 Carotid artery stenosis. Hypertension. Hyperlipidemia. Lipid panel 11/21/2022: LDL 47, HDL 78, TG 85, total 142.  In summary, patient was first evaluated by Dr. Okey Dupre in September 2018 for aortic atherosclerosis.  He reported 10 years prior he had been told that he may have A-fib but no further workup or treatment was pursued.  He reported feeling his heart beating irregularly at times lasting less than a minute.  Exercise stress testing showed good exercise capacity with hypertensive BP response and no chest pain reported, ST segment depression of 2 mm noted during stress with return to baseline after 1 to 5 minutes of recovery, intermediate risk.  Echo demonstrated EF 55 to 60%, no RWMA, normal diastolic parameters, normal RV function, normal PA pressure, no significant valvular abnormalities.  Coronary CTA showed a calcium score of 0.  In August 2022 patient complained of lower extremity edema.  Echo at that time showed normal LV/RV function as detailed above.  Patient was last seen in the office by Dr. Okey Dupre on 11/18/2023 for routine follow-up.  He complained of  intermittent palpitations he felt was A-fib described as his heart skipping or beating irregularly lasting about 5 seconds.  He also noted some lightheadedness and dyspnea while walking.  14-day ZIO did not demonstrate A-fib as detailed above.     History of Present Illness    Today, patient is here alone. He reports concern over SBP. Toprol was decreased at last visit to see if his DBP would increase and improve his dizziness. Right after that visit he stopped gabapentin and dizziness resolved completely. He did cut back his Toprol and DBP consistently >60. However, his SBP has been creeping up. He states he checked BP 21 days in a row and 15 of the 21 days SBP was between 144-152. He was checking at random times of the day. He is concerned that BP cuff may be inaccurate. He has it with him today. He continues to have dyspnea on exertion. He has left greater than right lower extremity edema. No orthopnea or PND. He also reports continued palpitations. Discussed the results of the heart monitor in detail. All questions answered.     ROS: All other systems reviewed and are otherwise negative except as noted in History of Present Illness.  EKGs/Labs Reviewed        EKG not ordered today.   Physical Exam    VS:  BP (!) 125/50   Pulse 65   Ht 6' (1.829 m)   Wt 156 lb 9.6 oz (71 kg)   SpO2 98%  BMI 21.24 kg/m  , BMI Body mass index is 21.24 kg/m.  GEN: Well nourished, well developed, in no acute distress. Neck: No JVD or carotid bruits. Cardiac:  RRR. No murmurs. No rubs or gallops.   Respiratory:  Respirations regular and unlabored. Clear to auscultation without rales, wheezing or rhonchi. GI: Soft, nontender, nondistended. Extremities: Radials/DP/PT 2+ and equal bilaterally. No clubbing or cyanosis. Mild edema left lower extremity, trace right lower extremity. Chronic color changes bilateral lower extremities.   Skin: Warm and dry, no rash. Neuro: Strength intact.  Assessment &  Plan   DOE/lightheadedness  Echo August 2022 EF 55-60%, no RWMA, normal RV size/function, dilated IVC, RA pressure 15 mmHg. Patient reports continued DOE. Lightheadedness resolved with stopping gabapentin.  Given patient's persistent symptoms will update echo and ischemic evaluation.  -Update echo.  -Schedule coronary CTA. -CBC and BMP today.   Palpitations 14-day ZIO January 2025 demonstrated 5 runs of SVT lasting up to 8 beats with max rate 156 bpm.  Patient reports continued palpitations. RRR on exam today.  -Continue Toprol.  Lower extremity edema Patient reports left greater than right lower extremity edema. He had a varicose vein lysed years ago in his left lower extremity and it has always been slightly bigger than the right when he has edema. He is concerned about discoloration of lower extremities. Discussed chronic color changes secondary to venous stasis. PT/DP pulses 2+ and equal. Mild edema left lower extremity and trace right lower extremity.  -Continue compression and elevation for management.  Hypertension BP today 125/50 on intake. Home SBP 15 out of 21 days checked was 144-152. DBP consistently >60. He has his cuff here today. His reading was 134/71 and my manual reading was 132/56. Will not make any changes for now. Patient in agreement.  -Continue losartan, Toprol.  Hyperlipidemia LDL February 2024 47, at goal. -Continue rosuvastatin. -Followed by PCP.  Disposition: CBC and BMP today. Echo. Coronary CTA. Return in 6 weeks or sooner as needed.          Signed, Etta Grandchild. Daniel Sivertsen, DNP, NP-C

## 2023-12-31 LAB — CBC
Hematocrit: 35 % — ABNORMAL LOW (ref 37.5–51.0)
Hemoglobin: 11.9 g/dL — ABNORMAL LOW (ref 13.0–17.7)
MCH: 35.4 pg — ABNORMAL HIGH (ref 26.6–33.0)
MCHC: 34 g/dL (ref 31.5–35.7)
MCV: 104 fL — ABNORMAL HIGH (ref 79–97)
Platelets: 191 10*3/uL (ref 150–450)
RBC: 3.36 x10E6/uL — ABNORMAL LOW (ref 4.14–5.80)
RDW: 11.8 % (ref 11.6–15.4)
WBC: 5.7 10*3/uL (ref 3.4–10.8)

## 2023-12-31 LAB — BASIC METABOLIC PANEL
BUN/Creatinine Ratio: 21 (ref 10–24)
BUN: 23 mg/dL (ref 8–27)
CO2: 25 mmol/L (ref 20–29)
Calcium: 9.3 mg/dL (ref 8.6–10.2)
Chloride: 97 mmol/L (ref 96–106)
Creatinine, Ser: 1.1 mg/dL (ref 0.76–1.27)
Glucose: 99 mg/dL (ref 70–99)
Potassium: 4.8 mmol/L (ref 3.5–5.2)
Sodium: 135 mmol/L (ref 134–144)
eGFR: 70 mL/min/{1.73_m2} (ref >59)

## 2024-01-05 ENCOUNTER — Telehealth (HOSPITAL_COMMUNITY): Payer: Self-pay | Admitting: *Deleted

## 2024-01-05 NOTE — Telephone Encounter (Signed)
 Reaching out to patient to offer assistance regarding upcoming cardiac imaging study; pt verbalizes understanding of appt date/time, parking situation and where to check in, pre-test NPO status and verified current allergies; name and call back number provided for further questions should they arise  Larey Brick RN Navigator Cardiac Imaging [facility] [phone] office [phone] cell  Patient to take 25mg  metoprolol succinate for his cardiac CT scan.

## 2024-01-07 ENCOUNTER — Ambulatory Visit
Admission: RE | Admit: 2024-01-07 | Discharge: 2024-01-07 | Disposition: A | Discharge status: Home / Self Care | Source: Ambulatory Visit | Attending: Student | Admitting: Student

## 2024-01-07 DIAGNOSIS — R600 Localized edema: Secondary | ICD-10-CM | POA: Insufficient documentation

## 2024-01-07 DIAGNOSIS — R0989 Other specified symptoms and signs involving the circulatory and respiratory systems: Secondary | ICD-10-CM | POA: Diagnosis present

## 2024-01-07 DIAGNOSIS — I509 Heart failure, unspecified: Secondary | ICD-10-CM | POA: Insufficient documentation

## 2024-01-07 MED ORDER — NITROGLYCERIN 0.4 MG SL SUBL
0.8000 mg | SUBLINGUAL_TABLET | Freq: Once | SUBLINGUAL | Status: AC
  Administered 2024-01-07: 0.8 mg via SUBLINGUAL

## 2024-01-07 MED ORDER — IOHEXOL 350 MG/ML SOLN
75.0000 mL | Freq: Once | INTRAVENOUS | Status: AC | PRN
  Administered 2024-01-07: 75 mL via INTRAVENOUS

## 2024-01-07 MED ORDER — SODIUM CHLORIDE 0.9 % IV SOLN: INTRAVENOUS | Status: DC

## 2024-01-07 NOTE — Progress Notes (Signed)
Patient tolerated procedure well. Ambulate w/o difficulty. Denies any lightheadedness or being dizzy. Pt denies any pain at this time. Sitting in chair, drinking water provided. Pt is encouraged to drink additional water throughout the day and reason explained to patient. Patient verbalized understanding and all questions answered. ABC intact. No further needs at this time. Discharge from procedure area w/o issues.  

## 2024-01-28 ENCOUNTER — Ambulatory Visit: Attending: Student

## 2024-01-28 DIAGNOSIS — R0609 Other forms of dyspnea: Secondary | ICD-10-CM | POA: Diagnosis not present

## 2024-01-28 LAB — ECHOCARDIOGRAM COMPLETE
AR max vel: 2.21 cm2
AV Area VTI: 2.44 cm2
AV Area mean vel: 2.27 cm2
AV Mean grad: 6 mmHg
AV Peak grad: 11.7 mmHg
Ao pk vel: 1.71 m/s
Area-P 1/2: 2.5 cm2
S' Lateral: 2.44 cm

## 2024-02-05 NOTE — Progress Notes (Unsigned)
 Cardiology Clinic Note   Date: 02/08/2024 ID: Daniel Owens 04-18-1946, MRN [id number]  Primary Cardiologist:  Maleak Crisp, MD  Chief Complaint   Daniel Owens is a 78 y.o. male who presents to the clinic today for follow up after testing.  Patient Profile   Daniel Owens is followed by Dr. Nolan Battle for the history outlined below.       Past medical history significant for: DOE Coronary CTA 01/07/2024: Calcium  score 0, no evidence of CAD. Echo 01/28/2024: EF 60 to 65%.  No RWMA.  Grade 1 DD.  Normal global strain.  Normal RV size/function.  Mild AI. Palpitations. 14-day ZIO 11/18/2023: HR 44 to 156 bpm, average 62 bpm.  First-degree AV block was noted.  Predominantly sinus rhythm.  Rare PACs/PVCs.  5 runs of SVT lasting up to 8 beats with max rate 156 bpm.  No sustained arrhythmia or prolonged pauses.  No A-fib. Lower extremity edema. Dilatation of thoracic aorta. Echo 06/19/2021: EF 55 to 60%.  No RWMA.  Normal RV size/function.  Mild dilatation of aortic root 40 mm.  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 Carotid artery stenosis. Hypertension. Hyperlipidemia. Lipid panel 11/21/2022: LDL 47, HDL 78, TG 85, total 142.  In summary, patient was first evaluated by Dr. Nolan Battle in September 2018 for aortic atherosclerosis.  He reported 10 years prior he had been told that he may have A-fib but no further workup or treatment was pursued.  He reported feeling his heart beating irregularly at times lasting less than a minute.  Exercise stress testing showed good exercise capacity with hypertensive BP response and no chest pain reported, ST segment depression of 2 mm noted during stress with return to baseline after 1 to 5 minutes of recovery, intermediate risk.  Echo demonstrated EF 55 to 60%, no RWMA, normal diastolic parameters, normal RV function, normal PA pressure, no significant valvular abnormalities.  Coronary CTA showed a calcium  score of 0.  In August 2022 patient complained of lower extremity  edema.  Echo at that time showed normal LV/RV function as detailed above.  Patient was seen in the office by Dr. Nolan Battle on 11/18/2023 for routine follow-up.  He complained of intermittent palpitations he felt was A-fib described as his heart skipping or beating irregularly lasting about 5 seconds.  He also noted some lightheadedness and dyspnea while walking.  14-day ZIO did not demonstrate A-fib as detailed above.   Patient was last seen in the office by me on 12/30/2023 for follow-up after testing.  He expressed concerns over SBP slowly increasing.  Toprol  had been decreased at his last visit to see if DBP would increase and improve his dizziness.  He checked his BP for 21 days in a row.  DBP improved and had consistently been > 60.  15 out of 21 days SBP 144-152.  He also reported continued DOE and left greater than right lower extremity edema.  Given persistent symptoms echo and coronary CTA were ordered for further evaluation.  His BP was well-controlled at the time of his visit.  Home BP cuff correlated with manual pressure reading.  No medication changes were made.  Coronary CTA showed a calcium  score of 0.  Echo showed normal LV/RV function as detailed above.     History of Present Illness    Today, patient is doing well.  He continues to have occasional palpitations lasting 5-6 seconds and resolving on its own. He has experienced this for years and it is unchanged.  Discussed coronary CTA and echo in detail and all questioned were answered. He mentions that his PCP would like him to start amlodipine  for Raynaud's. He is nervous about going on this medication, as PCP said he would need to decrease losartan  and he is worried about having poor BP control. He notes that his BP is well controlled in the mornings but elevated with SBP as high as 150s in the afternoon. He takes losartan  and metoprolol  at night.     ROS: All other systems reviewed and are otherwise negative except as noted in History of  Present Illness.  EKGs/Labs Reviewed       EKG is not performed today.   12/30/2023: BUN 23; Creatinine, Ser 1.10; Potassium 4.8; Sodium 135   12/30/2023: Hemoglobin 11.9; WBC 5.7    Physical Exam    VS:  BP (!) 120/54   Pulse (!) 58   Ht 6' (1.829 m)   Wt 153 lb 6.4 oz (69.6 kg)   SpO2 99%   BMI 20.80 kg/m  , BMI Body mass index is 20.8 kg/m.  GEN: Well nourished, well developed, in no acute distress. Neck: No JVD or carotid bruits. Cardiac:  RRR. No murmurs. No rubs or gallops.   Respiratory:  Respirations regular and unlabored. Clear to auscultation without rales, wheezing or rhonchi. GI: Soft, nontender, nondistended. Extremities: Radials/DP/PT 2+ and equal bilaterally. No clubbing or cyanosis. Mild nonpitting edema left lower extremity and trace right lower extremity.   Skin: Warm and dry, no rash. Neuro: Strength intact.  Assessment & Plan   DOE Coronary CTA March 2025 demonstrated calcium  score of 0 with no evidence of CAD.  Echo April 2025 showed normal LV/RV function, grade 1 DD, mild AI.  Patient denies dyspnea today.  -No further testing indicated.    Palpitations 14-day ZIO January 2025 demonstrated 5 runs of SVT lasting up to 8 beats with max rate 156 bpm.  Patient reports occasional palpitations lasting 5-6 seconds and resolving on its own. This has been going on for years and is unchanged. RRR on exam today.  -Continue Toprol .   Lower extremity edema Patient reports lower extremity edema unchanged from previous.  Mild nonpitting edema left lower extremity and trace right lower extremity. -Continue compression and elevation for management.   Hypertension BP today 120/54. Home readings are well controlled in the mornings and elevated with SBP up to 150 in the afternoons. He takes losartan  and Toprol  at night. Discussed switching losartan  to morning dosing.  -Continue losartan  to be taken in the AM, Toprol  at night.   Hyperlipidemia LDL February 2024 47,  at goal. -Continue rosuvastatin . -Followed by PCP.  Raynaud's Patient has a history of Raynaud's. His PCP would like to start him on amlodipine . He is concerned about this as he was told he would need to decrease losartan  dose. Discussed this at length with patient. I agree with recommendation to reduce losartan  to 25 mg if he decided to start low dose amlodipine . Patient voiced understanding.  -Follow up with PCP.   Disposition: Return in 6 months or sooner as needed.          Signed, Lonell Rives. Chene Kasinger, DNP, NP-C

## 2024-02-08 ENCOUNTER — Encounter: Payer: Self-pay | Admitting: Student

## 2024-02-08 ENCOUNTER — Ambulatory Visit: Attending: Student | Admitting: Student

## 2024-02-08 VITALS — BP 120/54 | HR 58 | Ht 72.0 in | Wt 153.4 lb

## 2024-02-08 DIAGNOSIS — I10 Essential (primary) hypertension: Secondary | ICD-10-CM | POA: Diagnosis not present

## 2024-02-08 DIAGNOSIS — R0609 Other forms of dyspnea: Secondary | ICD-10-CM | POA: Insufficient documentation

## 2024-02-08 DIAGNOSIS — R002 Palpitations: Secondary | ICD-10-CM | POA: Diagnosis present

## 2024-02-08 DIAGNOSIS — E7800 Pure hypercholesterolemia, unspecified: Secondary | ICD-10-CM | POA: Diagnosis present

## 2024-02-08 DIAGNOSIS — I7300 Raynaud's syndrome without gangrene: Secondary | ICD-10-CM | POA: Insufficient documentation

## 2024-02-08 DIAGNOSIS — R600 Localized edema: Secondary | ICD-10-CM | POA: Diagnosis not present

## 2024-02-08 NOTE — Patient Instructions (Signed)
 Medication Instructions:  Your physician recommends the following medication changes. Change Losartan  dosing to mornings.  *If you need a refill on your cardiac medications before your next appointment, please call your pharmacy*  Follow-Up: At [facility], you and your health needs are our priority.  As part of our continuing mission to provide you with exceptional heart care, our providers are all part of one team.  This team includes your primary Cardiologist (physician) and Advanced Practice Providers or APPs (Physician Assistants and Nurse Practitioners) who all work together to provide you with the care you need, when you need it.  Your next appointment:   6 month(s)  Provider:   Kahlil Crisp, MD or Morey Ar, NP

## 2024-02-11 ENCOUNTER — Ambulatory Visit: Admitting: Student

## 2024-07-20 ENCOUNTER — Other Ambulatory Visit: Payer: Self-pay | Admitting: Neurological Surgery

## 2024-07-20 DIAGNOSIS — M4150 Other secondary scoliosis, site unspecified: Secondary | ICD-10-CM

## 2024-07-23 ENCOUNTER — Ambulatory Visit
Admission: RE | Admit: 2024-07-23 | Discharge: 2024-07-23 | Disposition: A | Source: Ambulatory Visit | Attending: Neurological Surgery | Admitting: Neurological Surgery

## 2024-07-23 ENCOUNTER — Ambulatory Visit
Admission: RE | Admit: 2024-07-23 | Discharge: 2024-07-23 | Disposition: A | Discharge status: Home / Self Care | Source: Ambulatory Visit | Attending: Neurological Surgery | Admitting: Neurological Surgery

## 2024-07-23 DIAGNOSIS — M4150 Other secondary scoliosis, site unspecified: Secondary | ICD-10-CM

## 2024-08-05 ENCOUNTER — Ambulatory Visit: Admitting: Student

## 2024-08-05 NOTE — Progress Notes (Deleted)
 Cardiology Clinic Note   Date: 08/05/2024 ID: Alonza, Knisley 03/30/1946, MRN [id number]  Primary Cardiologist:  Lonni Hanson, MD  Chief Complaint   MARIN MILLEY is a 78 y.o. male who presents to the clinic today for ***  Patient Profile   Tyrail Grandfield Lancour is followed by *** for the history outlined below.      Past medical history significant for: DOE Coronary CTA 01/07/2024: Calcium  score 0, no evidence of CAD. Echo 01/28/2024: EF 60 to 65%.  No RWMA.  Grade 1 DD.  Normal global strain.  Normal RV size/function.  Mild AI. Palpitations. 14-day ZIO 11/18/2023: HR 44 to 156 bpm, average 62 bpm.  First-degree AV block was noted.  Predominantly sinus rhythm.  Rare PACs/PVCs.  5 runs of SVT lasting up to 8 beats with max rate 156 bpm.  No sustained arrhythmia or prolonged pauses.  No A-fib. Lower extremity edema. Dilatation of thoracic aorta. Echo 06/19/2021: EF 55 to 60%.  No RWMA.  Normal RV size/function.  Mild dilatation of aortic root 40 mm.  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 Carotid artery stenosis. Hypertension. Hyperlipidemia. Lipid panel 02/24/2024: LDL 41, HDL 78, TG 78, total 134. Raynaud's.   In summary, patient was first evaluated by Dr. Hanson in September 2018 for aortic atherosclerosis.  He reported 10 years prior he had been told that he may have A-fib but no further workup or treatment was pursued.  He reported feeling his heart beating irregularly at times lasting less than a minute.  Exercise stress testing showed good exercise capacity with hypertensive BP response and no chest pain reported, ST segment depression of 2 mm noted during stress with return to baseline after 1 to 5 minutes of recovery, intermediate risk.  Echo demonstrated EF 55 to 60%, no RWMA, normal diastolic parameters, normal RV function, normal PA pressure, no significant valvular abnormalities.  Coronary CTA showed a calcium  score of 0.  In August 2022 patient complained of lower extremity edema.  Echo at  that time showed normal LV/RV function as detailed above.  Patient was seen in the office by Dr. Hanson on 11/18/2023 for routine follow-up.  He complained of intermittent palpitations he felt was A-fib described as his heart skipping or beating irregularly lasting about 5 seconds.  He also noted some lightheadedness and dyspnea while walking.  14-day ZIO did not demonstrate A-fib as detailed above.   Patient was seen in the office on 12/30/2023 for follow-up after testing.  He expressed concerns over SBP slowly increasing.  Toprol  had been decreased at his last visit to see if DBP would increase and improve his dizziness.  He checked his BP for 21 days in a row.  DBP improved and had consistently been > 60.  15 out of 21 days SBP 144-152.  He also reported continued DOE and left greater than right lower extremity edema.  Given persistent symptoms echo and coronary CTA were ordered for further evaluation.  His BP was well-controlled at the time of his visit.  Home BP cuff correlated with manual pressure reading.  No medication changes were made.  Coronary CTA showed a calcium  score of 0.  Echo showed normal LV/RV function as detailed above.   Patient was last seen in the office by PA on 02/08/2024 for follow-up after testing.  He reported continued occasional palpitations lasting 5 to 6 seconds and resolving on their own.  This is unchanged for years.  He reported PCP wanted him to start  amlodipine  for Raynaud's.  PCP felt he should lower losartan  dose but patient was concerned about poor BP control.  He noted BP well-controlled in the mornings but elevated with SBP as high as the 150s in the afternoon.  He was taking losartan  and metoprolol  at night.  Recommended switching losartan  dosing to the morning and continue Toprol  at night.  Agreed with PCP recommendation of decreasing losartan  to 25 mg if low-dose amlodipine  was started.     History of Present Illness    Today, patient ***  DOE Coronary CTA March  2025 demonstrated calcium  score of 0 with no evidence of CAD.  Echo April 2025 showed normal LV/RV function, grade 1 DD, mild AI.  Patient denies dyspnea today. *** -No further testing indicated.    Palpitations 14-day ZIO January 2025 demonstrated 5 runs of SVT lasting up to 8 beats with max rate 156 bpm.  Patient reports occasional palpitations lasting 5-6 seconds and resolving on its own. This has been going on for years and is unchanged. RRR on exam today. *** -Continue Toprol .   Lower extremity edema Patient reports lower extremity edema unchanged from previous.  Mild nonpitting edema left lower extremity and trace right lower extremity. *** -Continue compression and elevation for management.   Hypertension BP today *** -Continue losartan  to be taken in the AM, Toprol  at night.   Hyperlipidemia LDL 40 18 Feb 2024, at goal. -Continue rosuvastatin . -Followed by PCP.   Raynaud's Patient has a history of Raynaud's. His PCP would like to start him on amlodipine . He is concerned about this as he was told he would need to decrease losartan  dose. Discussed this at length with patient. I agree with recommendation to reduce losartan  to 25 mg if he decided to start low dose amlodipine . Patient voiced understanding.*** -Follow up with PCP.   ROS: All other systems reviewed and are otherwise negative except as noted in History of Present Illness.  EKGs/Labs Reviewed        12/30/2023: BUN 23; Creatinine, Ser 1.10; Potassium 4.8; Sodium 135   12/30/2023: Hemoglobin 11.9; WBC 5.7   No results found for requested labs within last 365 days.   No results found for requested labs within last 365 days.  ***  Risk Assessment/Calculations    {Does this patient have ATRIAL FIBRILLATION?:[phone]} No BP recorded.  {Refresh Note OR Click here to enter BP  :1}***        Physical Exam    VS:  There were no vitals taken for this visit. , BMI There is no height or weight on file to calculate  BMI.  GEN: Well nourished, well developed, in no acute distress. Neck: No JVD or carotid bruits. Cardiac: *** RRR. *** No murmur. No rubs or gallops.   Respiratory:  Respirations regular and unlabored. Clear to auscultation without rales, wheezing or rhonchi. GI: Soft, nontender, nondistended. Extremities: Radials/DP/PT 2+ and equal bilaterally. No clubbing or cyanosis. No edema ***  Skin: Warm and dry, no rash. Neuro: Strength intact.  Assessment & Plan   ***  Disposition: ***     {Are you ordering a CV Procedure (e.g. stress test, cath, DCCV, TEE, etc)?   Press F2        :[id number]}   Signed, Barnie HERO. Marwin Primmer, DNP, NP-C

## 2024-08-08 ENCOUNTER — Ambulatory Visit: Admitting: Student

## 2024-08-23 NOTE — Progress Notes (Signed)
 Cardiology Clinic Note   Date: 09/06/2024 ID: [name], [name] 1946-07-05, MRN [id number]  Primary Cardiologist:  Lonni Hanson, MD  Chief Complaint   Daniel Owens is a 78 y.o. male who presents to the clinic today for routine follow-up.  Patient Profile   Daniel Owens is followed by Dr. Hanson for the history outlined below.      Past medical history significant for: DOE Coronary CTA 01/07/2024: Calcium  score 0, no evidence of CAD. Echo 01/28/2024: EF 60 to 65%.  No RWMA.  Grade 1 DD.  Normal global strain.  Normal RV size/function.  Mild AI. Palpitations. 14-day ZIO 11/18/2023: HR 44 to 156 bpm, average 62 bpm.  First-degree AV block was noted.  Predominantly sinus rhythm.  Rare PACs/PVCs.  5 runs of SVT lasting up to 8 beats with max rate 156 bpm.  No sustained arrhythmia or prolonged pauses.  No A-fib. Lower extremity edema. Dilatation of thoracic aorta. Echo 06/19/2021: EF 55 to 60%.  No RWMA.  Normal RV size/function.  Mild dilatation of aortic root 40 mm.  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 Carotid artery stenosis. Hypertension. Hyperlipidemia. Lipid panel 02/24/2024: LDL 41, HDL 78, TG 78, total 134. Raynaud's.   In summary, patient was first evaluated by Dr. Hanson in September 2018 for aortic atherosclerosis.  He reported 10 years prior he had been told that he may have A-fib but no further workup or treatment was pursued.  He reported feeling his heart beating irregularly at times lasting less than a minute.  Exercise stress testing showed good exercise capacity with hypertensive BP response and no chest pain reported, ST segment depression of 2 mm noted during stress with return to baseline after 1 to 5 minutes of recovery, intermediate risk.  Echo demonstrated EF 55 to 60%, no RWMA, normal diastolic parameters, normal RV function, normal PA pressure, no significant valvular abnormalities.  Coronary CTA showed a calcium  score of 0.  In August 2022 patient complained of lower  extremity edema.  Echo at that time showed normal LV/RV function as detailed above.  Patient was seen in the office by Dr. Hanson on 11/18/2023 for routine follow-up.  He complained of intermittent palpitations he felt was A-fib described as his heart skipping or beating irregularly lasting about 5 seconds.  He also noted some lightheadedness and dyspnea while walking.  14-day ZIO did not demonstrate A-fib as detailed above.   Patient was seen in the office on 12/30/2023 for follow-up after testing.  He expressed concerns over SBP slowly increasing.  Toprol  had been decreased at his last visit to see if DBP would increase and improve his dizziness.  He checked his BP for 21 days in a row.  DBP improved and had consistently been > 60.  15 out of 21 days SBP 144-152.  He also reported continued DOE and left greater than right lower extremity edema.  Given persistent symptoms echo and coronary CTA were ordered for further evaluation.  His BP was well-controlled at the time of his visit.  Home BP cuff correlated with manual pressure reading.  No medication changes were made.  Coronary CTA showed a calcium  score of 0.  Echo showed normal LV/RV function as detailed above.   Patient was last seen in the office by PA on 02/08/2024 for follow-up after testing.  He reported continued occasional palpitations lasting 5 to 6 seconds and resolving on their own.  This is unchanged for years.  He reported PCP wanted him  to start amlodipine  for Raynaud's.  PCP felt he should lower losartan  dose but patient was concerned about poor BP control.  He noted BP well-controlled in the mornings but elevated with SBP as high as the 150s in the afternoon.  He was taking losartan  and metoprolol  at night.  Recommended switching losartan  dosing to the morning and continue Toprol  at night.  Agreed with PCP recommendation of decreasing losartan  to 25 mg if low-dose amlodipine  was started.     History of Present Illness    Today, patient is  here alone.  He is doing well overall. Patient denies shortness of breath, dyspnea on exertion, lower extremity edema, orthopnea or PND. No chest pain, pressure, or tightness.  He continues to have brief episodes of palpitation in which he feels his heart speeding up and checks his carotid or radial pulse and it is racing.  Episodes do not last long and resolved spontaneously.  His activity is limited secondary to chronic back pain and neuropathy causing balance issues.  He has been under an increased amount of stress secondary to his wife being in poor health.    ROS: All other systems reviewed and are otherwise negative except as noted in History of Present Illness.  EKGs/Labs Reviewed    EKG Interpretation Date/Time:  Tuesday September 06 2024 11:14:40 EST Ventricular Rate:  60 PR Interval:  200 QRS Duration:  88 QT Interval:  410 QTC Calculation: 410 R Axis:   27  Text Interpretation: Normal sinus rhythm Normal ECG When compared with ECG of 18-Nov-2023 11:06, Criteria for Septal infarct are no longer Present Confirmed by Loistine Sober [phone]) on 09/06/2024 11:17:15 AM   12/30/2023: BUN 23; Creatinine, Ser 1.10; Potassium 4.8; Sodium 135   12/30/2023: Hemoglobin 11.9; WBC 5.7    Physical Exam    VS:  BP (!) 126/58   Pulse 60   Ht 5' 11 (1.803 m)   Wt 152 lb 12.8 oz (69.3 kg)   SpO2 98%   BMI 21.31 kg/m  , BMI Body mass index is 21.31 kg/m.  GEN: Well nourished, well developed, in no acute distress. Neck: No JVD or carotid bruits. Cardiac:  RRR.  No murmur. No rubs or gallops.   Respiratory:  Respirations regular and unlabored. Clear to auscultation without rales, wheezing or rhonchi. GI: Soft, nontender, nondistended. Extremities: Radials/DP/PT 2+ and equal bilaterally. No clubbing or cyanosis. No edema.  Skin: Warm and dry, no rash. Neuro: Strength intact.  Assessment & Plan   DOE Coronary CTA March 2025 demonstrated calcium  score of 0 with no evidence of CAD.   Echo April 2025 showed normal LV/RV function, grade 1 DD, mild AI.  Patient denies dyspnea today.  -No further testing indicated.    Palpitations/PSVT 14-day ZIO January 2025 demonstrated 5 runs of SVT lasting up to 8 beats with max rate 156 bpm.  Patient reports occasional palpitations lasting 5-6 seconds and resolving on its own.  He describes feeling his heart speeding up and checking his carotid R radial pulse and feeling like it is racing. RRR on exam today.  He is instructed to take extra half of Toprol  for prolonged episode of palpitations lasting > 15 minutes. -Continue Toprol .   Lower extremity edema Patient reports lower extremity edema unchanged from previous.  No lower extremity edema today. -Continue compression and elevation for management.   Hypertension BP today 126/58. -Continue losartan  to be taken in the AM, Toprol  at night.   Hyperlipidemia LDL 41 May 2025, at goal. -  Continue rosuvastatin . -Followed by PCP.  Disposition: Return in 6 months or sooner as needed.         Signed, Barnie HERO. Ger Nicks, DNP, NP-C

## 2024-09-06 ENCOUNTER — Ambulatory Visit: Attending: Student | Admitting: Student

## 2024-09-06 ENCOUNTER — Encounter: Payer: Self-pay | Admitting: Student

## 2024-09-06 VITALS — BP 126/58 | HR 60 | Ht 71.0 in | Wt 152.8 lb

## 2024-09-06 DIAGNOSIS — R0609 Other forms of dyspnea: Secondary | ICD-10-CM | POA: Diagnosis not present

## 2024-09-06 DIAGNOSIS — R600 Localized edema: Secondary | ICD-10-CM | POA: Diagnosis not present

## 2024-09-06 DIAGNOSIS — E7800 Pure hypercholesterolemia, unspecified: Secondary | ICD-10-CM | POA: Diagnosis present

## 2024-09-06 DIAGNOSIS — R002 Palpitations: Secondary | ICD-10-CM | POA: Insufficient documentation

## 2024-09-06 DIAGNOSIS — I471 Supraventricular tachycardia, unspecified: Secondary | ICD-10-CM | POA: Diagnosis present

## 2024-09-06 DIAGNOSIS — I10 Essential (primary) hypertension: Secondary | ICD-10-CM | POA: Diagnosis not present

## 2024-09-06 NOTE — Patient Instructions (Signed)
 Medication Instructions:   Your physician recommends that you continue on your current medications as directed. Please refer to the Current Medication list given to you today.   *If you need a refill on your cardiac medications before your next appointment, please call your pharmacy*  Lab Work:  No labs ordered today   If you have labs (blood work) drawn today and your tests are completely normal, you will receive your results only by: MyChart Message (if you have MyChart) OR A paper copy in the mail If you have any lab test that is abnormal or we need to change your treatment, we will call you to review the results.  Testing/Procedures:  No test ordered today   Follow-Up: At [facility], you and your health needs are our priority.  As part of our continuing mission to provide you with exceptional heart care, our providers are all part of one team.  This team includes your primary Cardiologist (physician) and Advanced Practice Providers or APPs (Physician Assistants and Nurse Practitioners) who all work together to provide you with the care you need, when you need it.  Your next appointment:    6 month(s)  Provider:   You may see Lonni Hanson, MD or one of the following Advanced Practice Providers on your designated Care Team:    Barnie Hila, NP   We recommend signing up for the patient portal called MyChart.  Sign up information is provided on this After Visit Summary.  MyChart is used to connect with patients for Virtual Visits (Telemedicine).  Patients are able to view lab/test results, encounter notes, upcoming appointments, etc.  Non-urgent messages can be sent to your provider as well.   To learn more about what you can do with MyChart, go to [email or web address].

## 2025-03-06 ENCOUNTER — Ambulatory Visit: Admitting: Student
